# Patient Record
Sex: Male | Born: 1945 | Race: White | Hispanic: No | Marital: Married | Smoking: Former smoker
Health system: Southern US, Community
[De-identification: ages and names within clinical notes are randomized; demographics above are authoritative.]

## PROBLEM LIST (undated history)

## (undated) DIAGNOSIS — Z8669 Personal history of other diseases of the nervous system and sense organs: Secondary | ICD-10-CM

## (undated) DIAGNOSIS — G2581 Restless legs syndrome: Secondary | ICD-10-CM

## (undated) DIAGNOSIS — M1712 Unilateral primary osteoarthritis, left knee: Secondary | ICD-10-CM

## (undated) DIAGNOSIS — K219 Gastro-esophageal reflux disease without esophagitis: Secondary | ICD-10-CM

## (undated) DIAGNOSIS — C801 Malignant (primary) neoplasm, unspecified: Secondary | ICD-10-CM

## (undated) DIAGNOSIS — R202 Paresthesia of skin: Secondary | ICD-10-CM

## (undated) DIAGNOSIS — Z862 Personal history of diseases of the blood and blood-forming organs and certain disorders involving the immune mechanism: Secondary | ICD-10-CM

## (undated) DIAGNOSIS — I499 Cardiac arrhythmia, unspecified: Secondary | ICD-10-CM

## (undated) DIAGNOSIS — G4730 Sleep apnea, unspecified: Secondary | ICD-10-CM

## (undated) DIAGNOSIS — I10 Essential (primary) hypertension: Secondary | ICD-10-CM

## (undated) DIAGNOSIS — M1710 Unilateral primary osteoarthritis, unspecified knee: Secondary | ICD-10-CM

## (undated) DIAGNOSIS — G459 Transient cerebral ischemic attack, unspecified: Secondary | ICD-10-CM

## (undated) DIAGNOSIS — Z22322 Carrier or suspected carrier of Methicillin resistant Staphylococcus aureus: Secondary | ICD-10-CM

## (undated) DIAGNOSIS — G629 Polyneuropathy, unspecified: Secondary | ICD-10-CM

## (undated) DIAGNOSIS — I959 Hypotension, unspecified: Secondary | ICD-10-CM

## (undated) DIAGNOSIS — A4901 Methicillin susceptible Staphylococcus aureus infection, unspecified site: Secondary | ICD-10-CM

## (undated) DIAGNOSIS — E119 Type 2 diabetes mellitus without complications: Secondary | ICD-10-CM

## (undated) DIAGNOSIS — J302 Other seasonal allergic rhinitis: Secondary | ICD-10-CM

## (undated) DIAGNOSIS — Z87442 Personal history of urinary calculi: Secondary | ICD-10-CM

## (undated) DIAGNOSIS — R413 Other amnesia: Secondary | ICD-10-CM

## (undated) DIAGNOSIS — I739 Peripheral vascular disease, unspecified: Secondary | ICD-10-CM

## (undated) DIAGNOSIS — G4700 Insomnia, unspecified: Secondary | ICD-10-CM

## (undated) HISTORY — DX: Restless legs syndrome: G25.81

## (undated) HISTORY — PX: CATARACT EXTRACTION, BILATERAL: SHX1313

## (undated) HISTORY — PX: UPPER GI ENDOSCOPY: SHX6162

## (undated) HISTORY — PX: TONSILLECTOMY: SUR1361

## (undated) HISTORY — DX: Paresthesia of skin: R20.2

## (undated) HISTORY — DX: Polyneuropathy, unspecified: G62.9

## (undated) HISTORY — DX: Other amnesia: R41.3

## (undated) HISTORY — PX: COLONOSCOPY: SHX174

## (undated) HISTORY — DX: Sleep apnea, unspecified: G47.30

## (undated) HISTORY — DX: Essential (primary) hypertension: I10

## (undated) HISTORY — DX: Gastro-esophageal reflux disease without esophagitis: K21.9

## (undated) HISTORY — DX: Insomnia, unspecified: G47.00

---
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
## 2004-09-21 ENCOUNTER — Ambulatory Visit: Payer: Self-pay | Admitting: Internal Medicine

## 2004-10-11 ENCOUNTER — Ambulatory Visit (HOSPITAL_COMMUNITY): Admission: RE | Admit: 2004-10-11 | Discharge: 2004-10-11 | Payer: Self-pay | Admitting: Internal Medicine

## 2005-07-10 ENCOUNTER — Ambulatory Visit: Payer: Self-pay | Admitting: Internal Medicine

## 2005-07-10 ENCOUNTER — Ambulatory Visit (HOSPITAL_COMMUNITY): Admission: RE | Admit: 2005-07-10 | Discharge: 2005-07-10 | Payer: Self-pay | Admitting: Internal Medicine

## 2005-08-30 ENCOUNTER — Ambulatory Visit: Payer: Self-pay | Admitting: Cardiology

## 2007-05-25 ENCOUNTER — Ambulatory Visit: Payer: Self-pay | Admitting: Cardiology

## 2007-07-18 ENCOUNTER — Ambulatory Visit: Payer: Self-pay | Admitting: Cardiology

## 2010-04-23 ENCOUNTER — Encounter (INDEPENDENT_AMBULATORY_CARE_PROVIDER_SITE_OTHER): Payer: Self-pay | Admitting: Internal Medicine

## 2010-08-15 NOTE — Assessment & Plan Note (Signed)
[facility]                          [facility] OFFICE NOTE   Jason [name], Jason [name]                       MRN:          [id number]  DATE:07/18/2007                            DOB:          1945-06-16    CARDIOLOGIST:  Learta Codding, MD,FACC.   PRIMARY CARE PHYSICIAN:  Doreen Beam, M.D.   HISTORY OF PRESENT ILLNESS:  Jason [name] is a 65 year old male patient  with a family history of coronary artery disease, who was recently seen  at [facility] with complaints of chest pain and near syncope.  He  ruled out for myocardial function by enzymes.  He had a stress  Cardiolite study that was negative for ischemia, and his EF was 59%.  The patient was also noted to have episodes of snoring and apnea during  sleep while in the hospital.  It was suggest that he be screened for  sleep apnea.  He also had abdominal and pelvic CT in the hospital.  This  was notable for a calcific density in the LAD coronary artery branch.   The patient returns to the office today for followup.  He denies any  recurrent chest pain, shortness breath, syncope, or near syncope.   CURRENT MEDICATIONS:  1. Requip p.r.n.  2. Prilosec daily.  3. Claritin 10 mg daily.  4. Zoloft 25 mg daily.  5. Aspirin 81 mg daily.   ALLERGIES:  NO KNOWN DRUG ALLERGIES.   PHYSICAL EXAMINATION:  GENERAL:  He is a well-nourished, well-developed  male in no distress.  VITAL SIGNS:  Blood pressure 129/90, pulse 85, weight 215.8 pounds.  Oxygen saturation 97% on room air.  HEENT:  Normal.  NECK:  Without JVD.  CARDIAC:  S1, S2.  Regular rate and rhythm, without murmur.  LUNGS:  Clear to auscultation bilaterally.  ABDOMEN:  Soft, nontender.  EXTREMITIES:  Without edema.  NEUROLOGIC:  He is alert and oriented x3.  Cranial nerves II-XII grossly  intact.  VASCULAR:  No carotid artery bruits noted bilaterally.   IMPRESSION:  1. Noncardiac chest pain.      a.     Recent nonischemic  Cardiolite study.      b.     Coronary artery calcification by abdominal CT in February       2009.  2. Family history of coronary artery disease.  3. Fatigue and snoring.      a.     Rule out sleep apnea.  4. Gastroesophageal reflux disease.  5. Dyslipidemia.      a.     LDL 106 in February 2009.   DISCUSSION:  Jason [name] returns to the office today for  posthospitalization followup.  Overall, he is doing well.  With his  coronary calcifications on CT scan and family history of CAD, I think we  should treat his risk factors aggressively.  Therefore, I think his LDL  goal should be less than or equal to 70.  He is in agreement to starting  on a statin.  We should also set him up for an ApneaLink to rule out the  possibility  of significant sleep apnea.   PLAN:  1. Simvastatin 20 mg at bedtime.  2. Repeat lipid and LFTs in 12 weeks.  3. Home ApneaLink to rule out significant sleep apnea.  He will need      formal sleep study if this is significantly positive.  4. Return in 6 months for routine followup.  I would have a low      threshold to proceed with cardiac catheterization in this patient,      since he does have a significant family history of CAD.  If he has      recurrent symptoms in the future, we should probably pursue this.      He is in agreement with this.      Tereso Newcomer, PA-C  Electronically Signed      Learta Codding, MD,FACC  Electronically Signed   SW/MedQ  DD: 07/18/2007  DT: 07/18/2007  Job #: [id number]   cc:   Doreen Beam, MD

## 2010-08-18 NOTE — Op Note (Signed)
NAMEARIES, TOWNLEY                ACCOUNT NO.:  [id number]   MEDICAL RECORD NO.:  [id number]          PATIENT TYPE:  AMB   LOCATION:  DAY                           FACILITY:  [facility]   PHYSICIAN:  Lionel December, M.D.    DATE OF BIRTH:  February 14, 1946   DATE OF PROCEDURE:  07/10/2005  DATE OF DISCHARGE:                                 OPERATIVE REPORT   PROCEDURE:  Esophagogastroduodenoscopy followed by colonoscopy.   Jason [name] is 65 year old Caucasian male with recurrent epigastric pain with  nausea and sporadic episodes of vomiting, who was evaluated by me last year  in June.  He had abdominal pelvic CT which suggested thickening to posterior  gastric wall.  Prior ultrasound and hepatobiliary scan had been normal.  EGD  was recommended since he had not responded to PPI and antispasmodic therapy,  but this has been postponed until now.  He is also undergoing screening  colonoscopy.  He states he does well as long as he stays on a bland diet.  Procedures and risks were reviewed the patient, and informed consent was  obtained.   MEDICATIONS FOR CONSCIOUS SEDATION:  Benzocaine spray for pharyngeal topical  anesthesia, Demerol 50 mg IV, Versed 8 mg IV.   FINDINGS:  Procedure performed in endoscopy suite.  The patient's vital  signs and O2 sat were monitored during procedure and remained stable.   PROCEDURE:  1.  Esophagogastroduodenoscopy.  The patient was placed left lateral      position.  Rectal examination performed.  No abnormality noted external      or digital exam and remained stable.   PROCEDURE:  1.  Esophagogastroduodenoscopy.  The patient was placed left lateral      position.  Olympus videoscope was passed via oropharynx without any      difficulty into esophagus.   ESOPHAGUS.  Mucosa of the esophagus normal.  He had focal erythema at GE  junction on the gastric side.  GE junction was at 40 cm from the incisors.   STOMACH.  It was empty and distended very well with  insufflation.  Folds of  proximal stomach were normal.  Examination of mucosa revealed prepyloric  erythema and edema without erosions or ulcers.  Angularis, fundus and cardia  examined by retroflexing scope and were normal.  Pyloric channel was  spastic, but no structural abnormality of stenosis noted.   Duodenum bulbar mucosa was normal.  Scope was passed in second part of the  duodenum where mucosa and folds were normal.  Endoscope was withdrawn and  the patient prepared for procedure #2.   COLONOSCOPY:  Rectal examination performed.  No abnormality noted on  external or digital exam.  Olympus videoscope was placed in rectum and  advanced under vision into sigmoid colon beyond.  Preparation was  satisfactory.  He had few scattered tiny diverticula at sigmoid colon.  Scope was passed into cecum which was identified by appendiceal orifice and  ileocecal valve.  Pictures taken for the record.  As the scope was  withdrawn, colonic mucosa was examined for the second time and there were no  polyps or other mucosal abnormalities.  Rectal mucosa was normal.  Scope was  retroflexed to examine anorectal junction and moderate-sized hemorrhoids  were noted below the dentate line.  Endoscope was straightened and  withdrawn.  The patient tolerated the procedure well.   FINAL DIAGNOSIS:  1.  Mild changes of reflux esophagitis limited to gastroesophageal junction.  2.  Prepyloric/antral nonerosive gastritis limited to gastroesophageal      junction.  3.  Antral/prepyloric nonerosive/antral nonerosive gastritis.  4.  Few tiny diverticula at sigmoid colon and external hemorrhoids,      otherwise normal colonoscopy.   RECOMMENDATIONS:  H. pylori serology will be checked.  He will continue  antireflux measures as before and he can take Prilosec OTC on p.r.n. basis.      Lionel December, M.D.  Electronically Signed     NR/MEDQ  D:  07/10/2005  T:  07/10/2005  Job:  [id number]   cc:   Dr. Weyman Pedro

## 2012-03-24 ENCOUNTER — Encounter: Payer: Self-pay | Admitting: Cardiology

## 2012-04-04 ENCOUNTER — Encounter: Payer: Self-pay | Admitting: Cardiology

## 2012-04-04 ENCOUNTER — Encounter: Payer: Self-pay | Admitting: *Deleted

## 2012-04-04 ENCOUNTER — Ambulatory Visit (INDEPENDENT_AMBULATORY_CARE_PROVIDER_SITE_OTHER): Payer: Medicare Other | Admitting: Cardiology

## 2012-04-04 VITALS — BP 135/91 | HR 79 | Ht 70.0 in | Wt 237.8 lb

## 2012-04-04 DIAGNOSIS — Z136 Encounter for screening for cardiovascular disorders: Secondary | ICD-10-CM

## 2012-04-04 DIAGNOSIS — R072 Precordial pain: Secondary | ICD-10-CM | POA: Insufficient documentation

## 2012-04-04 NOTE — Patient Instructions (Addendum)
Your physician has requested that you have an exercise tolerance test. For further information please visit [email or web address] Please also follow instruction sheet, as given. Your physician recommends that you continue on your current medications as directed. Please refer to the Current Medication list given to you today.  We will call you with your results.

## 2012-04-04 NOTE — Progress Notes (Signed)
HPI The patient presents for evaluation of lightheadedness with a presyncopal episode. We have seen him in the past. 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 There is a mention of a normal stress test in the past though I don't have this. He mentions a nuclear stress test as well as treadmill. I see an echocardiogram done recently which demonstrates no significant abnormalities. He has been noted to have some ventricular ectopy with stress testing in the past. However, he has not required any treatment for this. He has not had any palpitations. The episode of presyncope occurred after he ate something and became nauseated and diaphoretic. He had some chest discomfort with this. [place] seek medical attention. He let it pass over many minutes. He has had some occasional chest discomfort that has been vague and at rest. He's able to do things like some yard work and cut firewood without bringing on any symptoms. He doesn't have any new shortness of breath, PND or orthopnea. He does describe so upper abdominal or lower epigastric discomfort with bloating and indigestion occasionally.  Allergies  Allergen Reactions  . Augmentin (Amoxicillin-Pot Clavulanate)     Nausea   . Flexeril (Cyclobenzaprine)     Fatigue and sleepiness  . Penicillins     Current Outpatient Prescriptions  Medication Sig Dispense Refill  . albuterol-ipratropium (COMBIVENT) 18-103 MCG/ACT inhaler Inhale 2 puffs into the lungs every 6 (six) hours as needed.      . carbidopa-levodopa (SINEMET IR) 25-100 MG per tablet Take 1 tablet by mouth 2 (two) times daily as needed.      . metoprolol succinate (TOPROL-XL) 25 MG 24 hr tablet Take 25 mg by mouth daily.      . Multiple Vitamin (MULTIVITAMIN) tablet Take 1 tablet by mouth daily.      Marland Kitchen omeprazole (PRILOSEC) 20 MG capsule Take 20 mg by mouth 2 (two) times daily.      Marland Kitchen rOPINIRole (REQUIP) 2 MG tablet Take 2 mg  by mouth 2 (two) times daily.       Marland Kitchen zolpidem (AMBIEN) 10 MG tablet Take 10 mg by mouth at bedtime as needed.         Past Medical History  Diagnosis Date  . Allergic rhinitis, cause unspecified   . Sleep apnea   . Migraine, unspecified, without mention of intractable migraine without mention of status migrainosus   . Restless legs   . Insomnia     Past Surgical History  Procedure Date  . Tonsillectomy and adenoidectomy     Family History  Problem Relation Age of Onset  . Emphysema Father     Died age 63  . Sudden death Mother     Died age 74  . CAD Brother     Died age 73  . Hypertension Brother     History   Social History  . Marital Status: Married    Spouse Name: N/A    Number of Children: 2  . Years of Education: N/A   Occupational History  . Not on file.   Social History Main Topics  . Smoking status: Former Smoker    Types: Pipe  . Smokeless tobacco: Not on file  . Alcohol Use: Not on file  . Drug Use: Not on file  . Sexually Active: Not on file   Other Topics Concern  . Not on file   Social  History Narrative   Retiring from [employer].      ROS:  As stated in the HPI and negative for all other systems.  PHYSICAL EXAM BP 135/91  Pulse 79  Ht 5\' 10"  (1.778 m)  Wt 237 lb 12.8 oz (107.865 kg)  BMI 34.12 kg/m2 GENERAL:  Well appearing HEENT:  Pupils equal round and reactive, fundi not visualized, oral mucosa unremarkable NECK:  No jugular venous distention, waveform within normal limits, carotid upstroke brisk and symmetric, no bruits, no thyromegaly LYMPHATICS:  No cervical, inguinal adenopathy LUNGS:  Clear to auscultation bilaterally BACK:  No CVA tenderness CHEST:  Unremarkable HEART:  PMI not displaced or sustained,S1 and S2 within normal limits, no S3, no S4, no clicks, no rubs, no murmurs ABD:  Flat, positive bowel sounds normal in frequency in pitch, no bruits, no rebound, no guarding, no midline pulsatile mass, no hepatomegaly, no  splenomegaly EXT:  2 plus pulses throughout, no edema, no cyanosis no clubbing SKIN:  No rashes no nodules NEURO:  Cranial nerves II through XII grossly intact, motor grossly intact throughout PSYCH:  Cognitively intact, oriented to person place and time   EKG:  Sinus rhythm, rate 79, axis within normal limits, intervals within normal limits, early transition in lead V2, nonspecific T-wave changes.  ASSESSMENT AND PLAN  Chest discomfort - The patient's chest discomfort is somewhat atypical. However, given a mild family history of sudden cardiac death in his mom and an early age screening with a stress test is indicated.  I will bring the patient back for a POET (Plain Old Exercise Test). This will allow me to screen for obstructive coronary disease, risk stratify and very importantly provide a prescription for exercise.  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 This can be further evaluated at the time of the stress test.  Overweight - He and I discussed the need for and plan for weight loss with diet and exercise.  Presyncope - I suspect this was a vagal episode related to GI problems.

## 2012-04-07 ENCOUNTER — Ambulatory Visit: Payer: Self-pay | Admitting: Cardiology

## 2012-04-09 DIAGNOSIS — R072 Precordial pain: Secondary | ICD-10-CM

## 2012-04-15 ENCOUNTER — Telehealth: Payer: Self-pay | Admitting: *Deleted

## 2012-04-15 NOTE — Telephone Encounter (Signed)
Patient informed and copy sent to PCP. 

## 2012-04-15 NOTE — Telephone Encounter (Signed)
Message copied by Eustace Moore on Tue Apr 15, 2012  8:53 AM ------      Message from: Rollene Rotunda      Created: Sun Apr 13, 2012  3:22 PM       Negative ETT.  No further work up.  Call Mr. Mcferran with the results and send results to VYAS,DHRUV B., MD

## 2013-01-15 ENCOUNTER — Encounter: Payer: Self-pay | Admitting: Podiatry

## 2013-01-15 ENCOUNTER — Ambulatory Visit (INDEPENDENT_AMBULATORY_CARE_PROVIDER_SITE_OTHER): Payer: Medicare PPO

## 2013-01-15 ENCOUNTER — Ambulatory Visit (INDEPENDENT_AMBULATORY_CARE_PROVIDER_SITE_OTHER): Payer: Medicare PPO | Admitting: Podiatry

## 2013-01-15 VITALS — BP 125/85 | HR 79 | Resp 16 | Ht 70.0 in | Wt 235.0 lb

## 2013-01-15 DIAGNOSIS — R52 Pain, unspecified: Secondary | ICD-10-CM

## 2013-01-15 DIAGNOSIS — M722 Plantar fascial fibromatosis: Secondary | ICD-10-CM

## 2013-01-15 MED ORDER — MELOXICAM 15 MG PO TABS: 15.0000 mg | ORAL_TABLET | Freq: Every day | ORAL | Status: DC

## 2013-01-15 MED ORDER — METHYLPREDNISOLONE (PAK) 4 MG PO TABS: ORAL_TABLET | ORAL | Status: DC

## 2013-01-15 NOTE — Progress Notes (Signed)
Jason [name] presents today as a 67 year old white male with a chief complaint of painful feet bilaterally. He states that this is been going on for more than a year he still the pain from that his heels to his forefoot. He states it feels like he is walking on a swollen forefoot. Relates it's hard to stand for long periods of time. He has tried nonsteroidal anti-inflammatory drugs and rest to no avail. He states that he primarily wears deck shoes. Mornings are particularly bad and seems to worsen throughout the day. Seems the right foot may be worse than the left.  Objective: Vital signs are stable he is alert and oriented x3. I have reviewed his past medical history medications allergies and review of systems. Currently he has strong palpable pulses to the bilateral lower extremity. Neurologic sensorium is intact per Semmes-Weinstein monofilament. Deep tendon reflexes are intact and brisk bilateral. Muscle strength is intact and strong bilateral. Orthopedic evaluation demonstrates all joints distal to the ankle have a full range of motion without crepitus. He does have tenderness on end range of motion of the second metatarsophalangeal joints. Right greater than left. He does have pain on palpation of the medial calcaneal tubercle right greater than left. No pain on medial lateral compression of the calcaneus. Radiographic evaluation does demonstrate soft tissue increase in density at the plantar fascial calcaneal insertion site. Right greater than left.  Assessment: Plantar fasciitis bilateral with lateral compensatory syndrome and forefoot capsulitis at the metatarsophalangeal joints.  Plan: We discussed etiology pathology conservative versus surgical therapies. At this time we injected his bilateral heels at the point of maximal tenderness with 20 mg of Kenalog. Plantar fascial strapping was were applied. A night splint to the right foot. We discussed appropriate shoe gear stretching exercises ice therapy. He  was given both oral and written home-going instructions for the symptoms. He was also prescribed a Medrol Dosepak which will be followed by Mobic. I will followup with him in one month.

## 2013-01-15 NOTE — Patient Instructions (Signed)
Plantar Fasciitis (Heel Spur Syndrome) with Rehab The plantar fascia is a fibrous, ligament-like, soft-tissue structure that spans the bottom of the foot. Plantar fasciitis is a condition that causes pain in the foot due to inflammation of the tissue. SYMPTOMS   Pain and tenderness on the underneath side of the foot.  Pain that worsens with standing or walking. CAUSES  Plantar fasciitis is caused by irritation and injury to the plantar fascia on the underneath side of the foot. Common mechanisms of injury include:  Direct trauma to bottom of the foot.  Damage to a small nerve that runs under the foot where the main fascia attaches to the heel bone.  Stress placed on the plantar fascia due to bone spurs. RISK INCREASES WITH:   Activities that place stress on the plantar fascia (running, jumping, pivoting, or cutting).  Poor strength and flexibility.  Improperly fitted shoes.  Tight calf muscles.  Flat feet.  Failure to warm-up properly before activity.  Obesity. PREVENTION  Warm up and stretch properly before activity.  Allow for adequate recovery between workouts.  Maintain physical fitness:  Strength, flexibility, and endurance.  Cardiovascular fitness.  Maintain a health body weight.  Avoid stress on the plantar fascia.  Wear properly fitted shoes, including arch supports for individuals who have flat feet. PROGNOSIS  If treated properly, then the symptoms of plantar fasciitis usually resolve without surgery. However, occasionally surgery is necessary. RELATED COMPLICATIONS   Recurrent symptoms that may result in a chronic condition.  Problems of the lower back that are caused by compensating for the injury, such as limping.  Pain or weakness of the foot during push-off following surgery.  Chronic inflammation, scarring, and partial or complete fascia tear, occurring more often from repeated injections. TREATMENT  Treatment initially involves the use of  ice and medication to help reduce pain and inflammation. The use of strengthening and stretching exercises may help reduce pain with activity, especially stretches of the Achilles tendon. These exercises may be performed at home or with a therapist. Your caregiver may recommend that you use heel cups of arch supports to help reduce stress on the plantar fascia. Occasionally, corticosteroid injections are given to reduce inflammation. If symptoms persist for greater than 6 months despite non-surgical (conservative), then surgery may be recommended.  MEDICATION   If pain medication is necessary, then nonsteroidal anti-inflammatory medications, such as aspirin and ibuprofen, or other minor pain relievers, such as acetaminophen, are often recommended.  Do not take pain medication within 7 days before surgery.  Prescription pain relievers may be given if deemed necessary by your caregiver. Use only as directed and only as much as you need.  Corticosteroid injections may be given by your caregiver. These injections should be reserved for the most serious cases, because they may only be given a certain number of times. HEAT AND COLD  Cold treatment (icing) relieves pain and reduces inflammation. Cold treatment should be applied for 10 to 15 minutes every 2 to 3 hours for inflammation and pain and immediately after any activity that aggravates your symptoms. Use ice packs or massage the area with a piece of ice (ice massage).  Heat treatment may be used prior to performing the stretching and strengthening activities prescribed by your caregiver, physical therapist, or athletic trainer. Use a heat pack or soak the injury in warm water. SEEK IMMEDIATE MEDICAL CARE IF:  Treatment seems to offer no benefit, or the condition worsens.  Any medications produce adverse side effects. EXERCISES RANGE   OF MOTION (ROM) AND STRETCHING EXERCISES - Plantar Fasciitis (Heel Spur Syndrome) These exercises may help you  when beginning to rehabilitate your injury. Your symptoms may resolve with or without further involvement from your physician, physical therapist or athletic trainer. While completing these exercises, remember:   Restoring tissue flexibility helps normal motion to return to the joints. This allows healthier, less painful movement and activity.  An effective stretch should be held for at least 30 seconds.  A stretch should never be painful. You should only feel a gentle lengthening or release in the stretched tissue. RANGE OF MOTION - Toe Extension, Flexion  Sit with your right / left leg crossed over your opposite knee.  Grasp your toes and gently pull them back toward the top of your foot. You should feel a stretch on the bottom of your toes and/or foot.  Hold this stretch for __________ seconds.  Now, gently pull your toes toward the bottom of your foot. You should feel a stretch on the top of your toes and or foot.  Hold this stretch for __________ seconds. Repeat __________ times. Complete this stretch __________ times per day.  RANGE OF MOTION - Ankle Dorsiflexion, Active Assisted  Remove shoes and sit on a chair that is preferably not on a carpeted surface.  Place right / left foot under knee. Extend your opposite leg for support.  Keeping your heel down, slide your right / left foot back toward the chair until you feel a stretch at your ankle or calf. If you do not feel a stretch, slide your bottom forward to the edge of the chair, while still keeping your heel down.  Hold this stretch for __________ seconds. Repeat __________ times. Complete this stretch __________ times per day.  STRETCH  Gastroc, Standing  Place hands on wall.  Extend right / left leg, keeping the front knee somewhat bent.  Slightly point your toes inward on your back foot.  Keeping your right / left heel on the floor and your knee straight, shift your weight toward the wall, not allowing your back to  arch.  You should feel a gentle stretch in the right / left calf. Hold this position for __________ seconds. Repeat __________ times. Complete this stretch __________ times per day. STRETCH  Soleus, Standing  Place hands on wall.  Extend right / left leg, keeping the other knee somewhat bent.  Slightly point your toes inward on your back foot.  Keep your right / left heel on the floor, bend your back knee, and slightly shift your weight over the back leg so that you feel a gentle stretch deep in your back calf.  Hold this position for __________ seconds. Repeat __________ times. Complete this stretch __________ times per day. STRETCH  Gastrocsoleus, Standing  Note: This exercise can place a lot of stress on your foot and ankle. Please complete this exercise only if specifically instructed by your caregiver.   Place the ball of your right / left foot on a step, keeping your other foot firmly on the same step.  Hold on to the wall or a rail for balance.  Slowly lift your other foot, allowing your body weight to press your heel down over the edge of the step.  You should feel a stretch in your right / left calf.  Hold this position for __________ seconds.  Repeat this exercise with a slight bend in your right / left knee. Repeat __________ times. Complete this stretch __________ times per day.    STRENGTHENING EXERCISES - Plantar Fasciitis (Heel Spur Syndrome)  These exercises may help you when beginning to rehabilitate your injury. They may resolve your symptoms with or without further involvement from your physician, physical therapist or athletic trainer. While completing these exercises, remember:   Muscles can gain both the endurance and the strength needed for everyday activities through controlled exercises.  Complete these exercises as instructed by your physician, physical therapist or athletic trainer. Progress the resistance and repetitions only as guided. STRENGTH - Towel  Curls  Sit in a chair positioned on a non-carpeted surface.  Place your foot on a towel, keeping your heel on the floor.  Pull the towel toward your heel by only curling your toes. Keep your heel on the floor.  If instructed by your physician, physical therapist or athletic trainer, add ____________________ at the end of the towel. Repeat __________ times. Complete this exercise __________ times per day. STRENGTH - Ankle Inversion  Secure one end of a rubber exercise band/tubing to a fixed object (table, pole). Loop the other end around your foot just before your toes.  Place your fists between your knees. This will focus your strengthening at your ankle.  Slowly, pull your big toe up and in, making sure the band/tubing is positioned to resist the entire motion.  Hold this position for __________ seconds.  Have your muscles resist the band/tubing as it slowly pulls your foot back to the starting position. Repeat __________ times. Complete this exercises __________ times per day.  Document Released: 03/19/2005 Document Revised: 06/11/2011 Document Reviewed: 07/01/2008 [employer] Patient Information 2014 [employer]. Plantar Fasciitis Plantar fasciitis is a common condition that causes foot pain. It is soreness (inflammation) of the band of tough fibrous tissue on the bottom of the foot that runs from the heel bone (calcaneus) to the ball of the foot. The cause of this soreness may be from excessive standing, poor fitting shoes, running on hard surfaces, being overweight, having an abnormal walk, or overuse (this is common in runners) of the painful foot or feet. It is also common in aerobic exercise dancers and ballet dancers. SYMPTOMS  Most people with plantar fasciitis complain of:  Severe pain in the morning on the bottom of their foot especially when taking the first steps out of bed. This pain recedes after a few minutes of walking.  Severe pain is experienced also during walking  following a long period of inactivity.  Pain is worse when walking barefoot or up stairs DIAGNOSIS   Your caregiver will diagnose this condition by examining and feeling your foot.  Special tests such as X-rays of your foot, are usually not needed. PREVENTION   Consult a sports medicine professional before beginning a new exercise program.  Walking programs offer a good workout. With walking there is a lower chance of overuse injuries common to runners. There is less impact and less jarring of the joints.  Begin all new exercise programs slowly. If problems or pain develop, decrease the amount of time or distance until you are at a comfortable level.  Wear good shoes and replace them regularly.  Stretch your foot and the heel cords at the back of the ankle (Achilles tendon) both before and after exercise.  Run or exercise on even surfaces that are not hard. For example, asphalt is better than pavement.  Do not run barefoot on hard surfaces.  If using a treadmill, vary the incline.  Do not continue to workout if you have foot or joint   problems. Seek professional help if they do not improve. HOME CARE INSTRUCTIONS   Avoid activities that cause you pain until you recover.  Use ice or cold packs on the problem or painful areas after working out.  Only take over-the-counter or prescription medicines for pain, discomfort, or fever as directed by your caregiver.  Soft shoe inserts or athletic shoes with air or gel sole cushions may be helpful.  If problems continue or become more severe, consult a sports medicine caregiver or your own health care provider. Cortisone is a potent anti-inflammatory medication that may be injected into the painful area. You can discuss this treatment with your caregiver. MAKE SURE YOU:   Understand these instructions.  Will watch your condition.  Will get help right away if you are not doing well or get worse. Document Released: 12/12/2000 Document  Revised: 06/11/2011 Document Reviewed: 02/11/2008 [employer] Patient Information 2014 [employer].  

## 2013-01-15 NOTE — Progress Notes (Signed)
  Subjective:    Patient ID: Jason [name], male    DOB: 12-Nov-1945, 67 y.o.   MRN: [id number] "My feet hurt all over and they seem to swell."   Foot Pain This is a new problem. The current episode started more than 1 year ago. The problem occurs daily. The problem has been gradually worsening. Associated symptoms include arthralgias. The symptoms are aggravated by walking and standing. He has tried NSAIDs and rest for the symptoms. The treatment provided no relief.      Review of Systems  Constitutional: Positive for appetite change.  HENT: Negative.   Eyes: Negative.   Respiratory: Positive for apnea.   Cardiovascular: Negative.   Gastrointestinal: Positive for constipation.  Endocrine: Positive for cold intolerance.  Genitourinary: Negative.   Musculoskeletal: Positive for arthralgias and back pain.  Skin: Negative.   Allergic/Immunologic: Positive for environmental allergies.  Neurological: Negative.   Hematological: Negative.   Psychiatric/Behavioral: Positive for sleep disturbance.       Objective:   Physical Exam        Assessment & Plan:

## 2013-01-27 ENCOUNTER — Telehealth: Payer: Self-pay | Admitting: *Deleted

## 2013-01-27 NOTE — Telephone Encounter (Signed)
Pt asked how long to wear the strap brace, it's been on since 01/15/2013.  Pt described the tape strapping.  I informed pt that he could take the strappings off.   Pt states they did not help still having a lot of pain.  I transferred to schedulers for an appt.

## 2013-02-12 ENCOUNTER — Ambulatory Visit: Payer: Medicare PPO | Admitting: Podiatry

## 2013-02-17 ENCOUNTER — Encounter: Payer: Self-pay | Admitting: Podiatry

## 2013-02-17 ENCOUNTER — Ambulatory Visit (INDEPENDENT_AMBULATORY_CARE_PROVIDER_SITE_OTHER): Payer: Medicare PPO | Admitting: Podiatry

## 2013-02-17 VITALS — BP 120/82 | HR 62 | Resp 16 | Ht 70.0 in | Wt 240.0 lb

## 2013-02-17 DIAGNOSIS — M722 Plantar fascial fibromatosis: Secondary | ICD-10-CM

## 2013-02-17 MED ORDER — DICLOFENAC SODIUM 75 MG PO TBEC: 75.0000 mg | DELAYED_RELEASE_TABLET | Freq: Two times a day (BID) | ORAL | Status: DC

## 2013-02-17 NOTE — Progress Notes (Signed)
Jason [name] presents today for followup of his plantar fasciitis he states he has good days and bad days.  Objective: Pulses are strongly palpable bilateral mild tenderness on palpation of the bilateral heels. They're not warm to the touch.  Assessment: Mild plantar fasciitis apparently resolving.  Plan: Discontinue the use of Mobic we will switch him over to diclofenac 75 mg 1 by mouth twice daily. I will followup with him in one month.

## 2013-03-05 ENCOUNTER — Ambulatory Visit: Payer: Medicare PPO | Admitting: Podiatry

## 2013-03-31 ENCOUNTER — Ambulatory Visit: Payer: Medicare PPO | Admitting: Podiatry

## 2013-05-20 ENCOUNTER — Other Ambulatory Visit: Payer: Self-pay | Admitting: Orthopedic Surgery

## 2013-05-25 ENCOUNTER — Encounter (HOSPITAL_COMMUNITY): Payer: Self-pay | Admitting: Pharmacy Technician

## 2013-05-28 ENCOUNTER — Inpatient Hospital Stay (HOSPITAL_COMMUNITY)
Admission: RE | Admit: 2013-05-28 | Discharge: 2013-05-28 | Disposition: A | Discharge status: Home / Self Care | Payer: BC Managed Care – PPO | Source: Ambulatory Visit

## 2013-05-28 ENCOUNTER — Encounter (HOSPITAL_COMMUNITY): Payer: Self-pay

## 2013-05-28 NOTE — Progress Notes (Addendum)
PCP is Dr Louis Matte Cardiologist is Dr Percival Spanish at [facility]  Pt unable to make it here due to the snow, telephone interview completed with instructions given to be here March 3rd at 0800.

## 2013-06-01 MED ORDER — CLINDAMYCIN PHOSPHATE 900 MG/50ML IV SOLN
900.0000 mg | INTRAVENOUS | Status: AC
  Administered 2013-06-02: 900 mg via INTRAVENOUS
  Filled 2013-06-01: qty 50

## 2013-06-02 ENCOUNTER — Encounter (HOSPITAL_COMMUNITY)
Admission: RE | Disposition: A | Discharge status: Home Health | Payer: Self-pay | Source: Ambulatory Visit | Attending: Orthopedic Surgery

## 2013-06-02 ENCOUNTER — Encounter (HOSPITAL_COMMUNITY): Payer: Medicare PPO | Admitting: Certified Registered Nurse Anesthetist

## 2013-06-02 ENCOUNTER — Inpatient Hospital Stay (HOSPITAL_COMMUNITY): Payer: Medicare PPO

## 2013-06-02 ENCOUNTER — Encounter (HOSPITAL_COMMUNITY): Payer: Self-pay | Admitting: Anesthesiology

## 2013-06-02 ENCOUNTER — Inpatient Hospital Stay (HOSPITAL_COMMUNITY)
Admission: RE | Admit: 2013-06-02 | Discharge: 2013-06-03 | DRG: 470 | Disposition: A | Discharge status: Home Health | Payer: Medicare PPO | Source: Ambulatory Visit | Attending: Orthopedic Surgery | Admitting: Orthopedic Surgery

## 2013-06-02 ENCOUNTER — Inpatient Hospital Stay (HOSPITAL_COMMUNITY): Payer: Medicare PPO | Admitting: Certified Registered Nurse Anesthetist

## 2013-06-02 DIAGNOSIS — I10 Essential (primary) hypertension: Secondary | ICD-10-CM | POA: Diagnosis present

## 2013-06-02 DIAGNOSIS — Z7982 Long term (current) use of aspirin: Secondary | ICD-10-CM

## 2013-06-02 DIAGNOSIS — G8918 Other acute postprocedural pain: Secondary | ICD-10-CM | POA: Diagnosis not present

## 2013-06-02 DIAGNOSIS — G4700 Insomnia, unspecified: Secondary | ICD-10-CM | POA: Diagnosis present

## 2013-06-02 DIAGNOSIS — Z836 Family history of other diseases of the respiratory system: Secondary | ICD-10-CM

## 2013-06-02 DIAGNOSIS — Z888 Allergy status to other drugs, medicaments and biological substances status: Secondary | ICD-10-CM

## 2013-06-02 DIAGNOSIS — Z79899 Other long term (current) drug therapy: Secondary | ICD-10-CM

## 2013-06-02 DIAGNOSIS — K219 Gastro-esophageal reflux disease without esophagitis: Secondary | ICD-10-CM | POA: Diagnosis present

## 2013-06-02 DIAGNOSIS — G4730 Sleep apnea, unspecified: Secondary | ICD-10-CM

## 2013-06-02 DIAGNOSIS — M1710 Unilateral primary osteoarthritis, unspecified knee: Principal | ICD-10-CM | POA: Diagnosis present

## 2013-06-02 DIAGNOSIS — G2581 Restless legs syndrome: Secondary | ICD-10-CM | POA: Diagnosis present

## 2013-06-02 DIAGNOSIS — Z881 Allergy status to other antibiotic agents status: Secondary | ICD-10-CM

## 2013-06-02 DIAGNOSIS — M1711 Unilateral primary osteoarthritis, right knee: Secondary | ICD-10-CM | POA: Diagnosis present

## 2013-06-02 DIAGNOSIS — Z8249 Family history of ischemic heart disease and other diseases of the circulatory system: Secondary | ICD-10-CM

## 2013-06-02 DIAGNOSIS — Z87891 Personal history of nicotine dependence: Secondary | ICD-10-CM

## 2013-06-02 DIAGNOSIS — Z880 Allergy status to penicillin: Secondary | ICD-10-CM

## 2013-06-02 DIAGNOSIS — G43909 Migraine, unspecified, not intractable, without status migrainosus: Secondary | ICD-10-CM | POA: Diagnosis present

## 2013-06-02 HISTORY — PX: PARTIAL KNEE ARTHROPLASTY: SHX2174

## 2013-06-02 LAB — BASIC METABOLIC PANEL
BUN: 21 mg/dL (ref 6–23)
CO2: 21 mEq/L (ref 19–32)
Calcium: 9.3 mg/dL (ref 8.4–10.5)
Chloride: 101 mEq/L (ref 96–112)
Creatinine, Ser: 0.71 mg/dL (ref 0.50–1.35)
GFR calc Af Amer: >90 mL/min (ref >90)
GLUCOSE: 120 mg/dL — AB (ref 70–99)
Potassium: 4.4 mEq/L (ref 3.7–5.3)
SODIUM: 138 meq/L (ref 137–147)

## 2013-06-02 LAB — CBC
HCT: 42.4 % (ref 39.0–52.0)
HEMATOCRIT: 41.9 % (ref 39.0–52.0)
Hemoglobin: 15.1 g/dL (ref 13.0–17.0)
Hemoglobin: 14.6 g/dL (ref 13.0–17.0)
MCH: 32.5 pg (ref 26.0–34.0)
MCH: 32.2 pg (ref 26.0–34.0)
MCHC: 35.6 g/dL (ref 30.0–36.0)
MCHC: 34.8 g/dL (ref 30.0–36.0)
MCV: 91.2 fL (ref 78.0–100.0)
MCV: 92.3 fL (ref 78.0–100.0)
PLATELETS: 227 10*3/uL (ref 150–400)
Platelets: 217 10*3/uL (ref 150–400)
RBC: 4.65 MIL/uL (ref 4.22–5.81)
RBC: 4.54 MIL/uL (ref 4.22–5.81)
RDW: 14.2 % (ref 11.5–15.5)
RDW: 14.4 % (ref 11.5–15.5)
WBC: 8.6 10*3/uL (ref 4.0–10.5)
WBC: 10 10*3/uL (ref 4.0–10.5)

## 2013-06-02 LAB — SURGICAL PCR SCREEN
MRSA, PCR: NEGATIVE
Staphylococcus aureus: NEGATIVE

## 2013-06-02 LAB — CREATININE, SERUM: CREATININE: 0.81 mg/dL (ref 0.50–1.35)

## 2013-06-02 SURGERY — ARTHROPLASTY, KNEE, UNICOMPARTMENTAL
Anesthesia: Regional | Site: Knee | Laterality: Right

## 2013-06-02 MED ORDER — HYDROMORPHONE HCL PF 1 MG/ML IJ SOLN: 1.0000 mg | INTRAMUSCULAR | Status: DC | PRN

## 2013-06-02 MED ORDER — SODIUM CHLORIDE 0.9 % IR SOLN
Status: DC | PRN
  Administered 2013-06-02: 1000 mL

## 2013-06-02 MED ORDER — KETOROLAC TROMETHAMINE 15 MG/ML IJ SOLN
7.5000 mg | Freq: Four times a day (QID) | INTRAMUSCULAR | Status: AC
  Administered 2013-06-02 – 2013-06-03 (×4): 7.5 mg via INTRAVENOUS
  Filled 2013-06-02 (×2): qty 1

## 2013-06-02 MED ORDER — KETOROLAC TROMETHAMINE 15 MG/ML IJ SOLN
INTRAMUSCULAR | Status: AC
  Filled 2013-06-02: qty 1

## 2013-06-02 MED ORDER — SENNA-DOCUSATE SODIUM 8.6-50 MG PO TABS: 2.0000 | ORAL_TABLET | Freq: Every day | ORAL | Status: DC

## 2013-06-02 MED ORDER — SENNA 8.6 MG PO TABS
1.0000 | ORAL_TABLET | Freq: Two times a day (BID) | ORAL | Status: DC
  Administered 2013-06-02 – 2013-06-03 (×2): 8.6 mg via ORAL
  Filled 2013-06-02 (×3): qty 1

## 2013-06-02 MED ORDER — EPHEDRINE SULFATE 50 MG/ML IJ SOLN
INTRAMUSCULAR | Status: DC | PRN
  Administered 2013-06-02: 10 mg via INTRAVENOUS

## 2013-06-02 MED ORDER — DEXAMETHASONE SODIUM PHOSPHATE 4 MG/ML IJ SOLN
INTRAMUSCULAR | Status: DC | PRN
  Administered 2013-06-02: 4 mg via INTRAVENOUS

## 2013-06-02 MED ORDER — ACETAMINOPHEN 650 MG RE SUPP: 650.0000 mg | Freq: Four times a day (QID) | RECTAL | Status: DC | PRN

## 2013-06-02 MED ORDER — LIDOCAINE HCL (CARDIAC) 20 MG/ML IV SOLN
INTRAVENOUS | Status: AC
  Filled 2013-06-02: qty 5

## 2013-06-02 MED ORDER — METHOCARBAMOL 100 MG/ML IJ SOLN
500.0000 mg | Freq: Four times a day (QID) | INTRAVENOUS | Status: DC | PRN
  Filled 2013-06-02: qty 5

## 2013-06-02 MED ORDER — ZOLPIDEM TARTRATE 5 MG PO TABS: 5.0000 mg | ORAL_TABLET | Freq: Every evening | ORAL | Status: DC | PRN

## 2013-06-02 MED ORDER — OXYCODONE HCL 5 MG PO TABS
5.0000 mg | ORAL_TABLET | ORAL | Status: DC | PRN
  Administered 2013-06-02: 10 mg via ORAL
  Administered 2013-06-02: 5 mg via ORAL
  Administered 2013-06-03 (×3): 10 mg via ORAL
  Filled 2013-06-02 (×4): qty 2

## 2013-06-02 MED ORDER — KETOROLAC TROMETHAMINE 30 MG/ML IJ SOLN
INTRAMUSCULAR | Status: AC
  Administered 2013-06-02: 7.5 mg
  Filled 2013-06-02: qty 1

## 2013-06-02 MED ORDER — EPHEDRINE SULFATE 50 MG/ML IJ SOLN
INTRAMUSCULAR | Status: AC
  Filled 2013-06-02: qty 1

## 2013-06-02 MED ORDER — METHOCARBAMOL 500 MG PO TABS
500.0000 mg | ORAL_TABLET | Freq: Four times a day (QID) | ORAL | Status: DC | PRN
  Administered 2013-06-02 – 2013-06-03 (×3): 500 mg via ORAL
  Filled 2013-06-02 (×2): qty 1

## 2013-06-02 MED ORDER — STERILE WATER FOR INJECTION IJ SOLN
INTRAMUSCULAR | Status: AC
  Filled 2013-06-02: qty 10

## 2013-06-02 MED ORDER — CARBIDOPA-LEVODOPA 25-100 MG PO TABS
1.0000 | ORAL_TABLET | Freq: Two times a day (BID) | ORAL | Status: DC | PRN
  Filled 2013-06-02: qty 1

## 2013-06-02 MED ORDER — CEFAZOLIN SODIUM-DEXTROSE 2-3 GM-% IV SOLR
2.0000 g | Freq: Four times a day (QID) | INTRAVENOUS | Status: AC
  Administered 2013-06-02 (×2): 2 g via INTRAVENOUS
  Filled 2013-06-02 (×2): qty 50

## 2013-06-02 MED ORDER — DIPHENHYDRAMINE HCL 12.5 MG/5ML PO ELIX: 12.5000 mg | ORAL_SOLUTION | ORAL | Status: DC | PRN

## 2013-06-02 MED ORDER — METHOCARBAMOL 500 MG PO TABS: 500.0000 mg | ORAL_TABLET | Freq: Four times a day (QID) | ORAL | Status: DC

## 2013-06-02 MED ORDER — MIDAZOLAM HCL 2 MG/2ML IJ SOLN
INTRAMUSCULAR | Status: AC
  Administered 2013-06-02: 2 mg
  Filled 2013-06-02: qty 2

## 2013-06-02 MED ORDER — ONDANSETRON HCL 4 MG/2ML IJ SOLN
INTRAMUSCULAR | Status: AC
  Filled 2013-06-02: qty 2

## 2013-06-02 MED ORDER — ENOXAPARIN SODIUM 30 MG/0.3ML ~~LOC~~ SOLN
30.0000 mg | Freq: Two times a day (BID) | SUBCUTANEOUS | Status: DC
  Administered 2013-06-03: 30 mg via SUBCUTANEOUS
  Filled 2013-06-02 (×3): qty 0.3

## 2013-06-02 MED ORDER — ARTIFICIAL TEARS OP OINT
TOPICAL_OINTMENT | OPHTHALMIC | Status: DC | PRN
  Administered 2013-06-02: 1 via OPHTHALMIC

## 2013-06-02 MED ORDER — OXYCODONE HCL 5 MG PO TABS
5.0000 mg | ORAL_TABLET | Freq: Once | ORAL | Status: AC | PRN
  Administered 2013-06-02: 5 mg via ORAL

## 2013-06-02 MED ORDER — METOCLOPRAMIDE HCL 10 MG PO TABS
5.0000 mg | ORAL_TABLET | Freq: Three times a day (TID) | ORAL | Status: DC | PRN
Start: 2013-06-02 — End: 2013-06-03

## 2013-06-02 MED ORDER — MUPIROCIN 2 % EX OINT
TOPICAL_OINTMENT | CUTANEOUS | Status: AC
  Administered 2013-06-02: 08:00:00 via NASAL
  Filled 2013-06-02: qty 22

## 2013-06-02 MED ORDER — HYDROMORPHONE HCL PF 1 MG/ML IJ SOLN
INTRAMUSCULAR | Status: AC
  Filled 2013-06-02: qty 1

## 2013-06-02 MED ORDER — METOPROLOL TARTRATE 25 MG PO TABS
25.0000 mg | ORAL_TABLET | Freq: Two times a day (BID) | ORAL | Status: DC
  Administered 2013-06-02 – 2013-06-03 (×2): 25 mg via ORAL
  Filled 2013-06-02 (×3): qty 1

## 2013-06-02 MED ORDER — PROPOFOL 10 MG/ML IV BOLUS
INTRAVENOUS | Status: DC | PRN
  Administered 2013-06-02: 190 mg via INTRAVENOUS

## 2013-06-02 MED ORDER — DEXAMETHASONE SODIUM PHOSPHATE 4 MG/ML IJ SOLN
INTRAMUSCULAR | Status: AC
  Filled 2013-06-02: qty 1

## 2013-06-02 MED ORDER — PROMETHAZINE HCL 25 MG PO TABS: 25.0000 mg | ORAL_TABLET | Freq: Four times a day (QID) | ORAL | Status: DC | PRN

## 2013-06-02 MED ORDER — OXYCODONE-ACETAMINOPHEN 10-325 MG PO TABS: 1.0000 | ORAL_TABLET | Freq: Four times a day (QID) | ORAL | Status: DC | PRN

## 2013-06-02 MED ORDER — OXYCODONE HCL 5 MG PO TABS
ORAL_TABLET | ORAL | Status: AC
  Filled 2013-06-02: qty 1

## 2013-06-02 MED ORDER — LIDOCAINE HCL (CARDIAC) 20 MG/ML IV SOLN
INTRAVENOUS | Status: DC | PRN
  Administered 2013-06-02: 60 mg via INTRAVENOUS

## 2013-06-02 MED ORDER — FENTANYL CITRATE 0.05 MG/ML IJ SOLN
INTRAMUSCULAR | Status: DC | PRN
  Administered 2013-06-02: 50 ug via INTRAVENOUS
  Administered 2013-06-02: 25 ug via INTRAVENOUS
  Administered 2013-06-02: 50 ug via INTRAVENOUS
  Administered 2013-06-02: 25 ug via INTRAVENOUS
  Administered 2013-06-02: 50 ug via INTRAVENOUS

## 2013-06-02 MED ORDER — POLYETHYLENE GLYCOL 3350 17 G PO PACK: 17.0000 g | PACK | Freq: Every day | ORAL | Status: DC | PRN

## 2013-06-02 MED ORDER — HYDROMORPHONE HCL PF 1 MG/ML IJ SOLN
0.2500 mg | INTRAMUSCULAR | Status: DC | PRN
  Administered 2013-06-02: 0.5 mg via INTRAVENOUS

## 2013-06-02 MED ORDER — GABAPENTIN 300 MG PO CAPS
300.0000 mg | ORAL_CAPSULE | Freq: Every day | ORAL | Status: DC
  Administered 2013-06-02: 300 mg via ORAL
  Filled 2013-06-02 (×2): qty 1

## 2013-06-02 MED ORDER — DOCUSATE SODIUM 100 MG PO CAPS
100.0000 mg | ORAL_CAPSULE | Freq: Every day | ORAL | Status: DC
  Administered 2013-06-02: 100 mg via ORAL
  Filled 2013-06-02: qty 1

## 2013-06-02 MED ORDER — PROPOFOL 10 MG/ML IV BOLUS
INTRAVENOUS | Status: AC
  Filled 2013-06-02: qty 20

## 2013-06-02 MED ORDER — POTASSIUM CHLORIDE IN NACL 20-0.45 MEQ/L-% IV SOLN
INTRAVENOUS | Status: DC
  Administered 2013-06-02 – 2013-06-03 (×2): via INTRAVENOUS
  Filled 2013-06-02 (×4): qty 1000

## 2013-06-02 MED ORDER — HYDROMORPHONE HCL PF 1 MG/ML IJ SOLN: 0.5000 mg | Freq: Once | INTRAMUSCULAR | Status: DC

## 2013-06-02 MED ORDER — FENTANYL CITRATE 0.05 MG/ML IJ SOLN
INTRAMUSCULAR | Status: AC
  Filled 2013-06-02: qty 5

## 2013-06-02 MED ORDER — LACTATED RINGERS IV SOLN
INTRAVENOUS | Status: DC
  Administered 2013-06-02 (×2): via INTRAVENOUS

## 2013-06-02 MED ORDER — PANTOPRAZOLE SODIUM 40 MG PO TBEC
40.0000 mg | DELAYED_RELEASE_TABLET | Freq: Every day | ORAL | Status: DC
  Administered 2013-06-02 – 2013-06-03 (×2): 40 mg via ORAL
  Filled 2013-06-02 (×2): qty 1

## 2013-06-02 MED ORDER — MUPIROCIN 2 % EX OINT
TOPICAL_OINTMENT | Freq: Two times a day (BID) | CUTANEOUS | Status: DC
  Filled 2013-06-02: qty 22

## 2013-06-02 MED ORDER — ACETAMINOPHEN 325 MG PO TABS: 650.0000 mg | ORAL_TABLET | Freq: Four times a day (QID) | ORAL | Status: DC | PRN

## 2013-06-02 MED ORDER — ROPINIROLE HCL 1 MG PO TABS
2.0000 mg | ORAL_TABLET | Freq: Three times a day (TID) | ORAL | Status: DC
  Administered 2013-06-02 – 2013-06-03 (×2): 2 mg via ORAL
  Filled 2013-06-02 (×7): qty 2

## 2013-06-02 MED ORDER — METOPROLOL TARTRATE 25 MG PO TABS
25.0000 mg | ORAL_TABLET | Freq: Once | ORAL | Status: AC
  Administered 2013-06-02: 25 mg via ORAL

## 2013-06-02 MED ORDER — CEFAZOLIN SODIUM-DEXTROSE 2-3 GM-% IV SOLR
INTRAVENOUS | Status: DC | PRN
  Administered 2013-06-02: 2 g via INTRAVENOUS

## 2013-06-02 MED ORDER — BISACODYL 10 MG RE SUPP: 10.0000 mg | Freq: Every day | RECTAL | Status: DC | PRN

## 2013-06-02 MED ORDER — PHENOL 1.4 % MT LIQD: 1.0000 | OROMUCOSAL | Status: DC | PRN

## 2013-06-02 MED ORDER — METOCLOPRAMIDE HCL 5 MG/ML IJ SOLN: 5.0000 mg | Freq: Three times a day (TID) | INTRAMUSCULAR | Status: DC | PRN

## 2013-06-02 MED ORDER — OXYCODONE HCL 5 MG/5ML PO SOLN: 5.0000 mg | Freq: Once | ORAL | Status: AC | PRN

## 2013-06-02 MED ORDER — METOPROLOL TARTRATE 12.5 MG HALF TABLET
ORAL_TABLET | ORAL | Status: AC
  Filled 2013-06-02: qty 2

## 2013-06-02 MED ORDER — ASPIRIN EC 81 MG PO TBEC
81.0000 mg | DELAYED_RELEASE_TABLET | Freq: Every day | ORAL | Status: DC
  Administered 2013-06-02 – 2013-06-03 (×2): 81 mg via ORAL
  Filled 2013-06-02 (×2): qty 1

## 2013-06-02 MED ORDER — PHENYLEPHRINE HCL 10 MG/ML IJ SOLN
INTRAMUSCULAR | Status: DC | PRN
  Administered 2013-06-02 (×2): 80 ug via INTRAVENOUS

## 2013-06-02 MED ORDER — ARTIFICIAL TEARS OP OINT
TOPICAL_OINTMENT | OPHTHALMIC | Status: AC
  Filled 2013-06-02: qty 3.5

## 2013-06-02 MED ORDER — METHOCARBAMOL 500 MG PO TABS
ORAL_TABLET | ORAL | Status: AC
  Filled 2013-06-02: qty 1

## 2013-06-02 MED ORDER — MENTHOL 3 MG MT LOZG: 1.0000 | LOZENGE | OROMUCOSAL | Status: DC | PRN

## 2013-06-02 MED ORDER — MIDAZOLAM HCL 5 MG/5ML IJ SOLN
INTRAMUSCULAR | Status: DC | PRN
  Administered 2013-06-02 (×2): 1 mg via INTRAVENOUS

## 2013-06-02 MED ORDER — FENTANYL CITRATE 0.05 MG/ML IJ SOLN
INTRAMUSCULAR | Status: AC
  Administered 2013-06-02: 100 ug
  Filled 2013-06-02: qty 2

## 2013-06-02 MED ORDER — HYDROMORPHONE HCL PF 1 MG/ML IJ SOLN
0.2500 mg | INTRAMUSCULAR | Status: DC | PRN
  Administered 2013-06-02 (×4): 0.5 mg via INTRAVENOUS

## 2013-06-02 MED ORDER — ALUM & MAG HYDROXIDE-SIMETH 200-200-20 MG/5ML PO SUSP: 30.0000 mL | ORAL | Status: DC | PRN

## 2013-06-02 MED ORDER — ONDANSETRON HCL 4 MG/2ML IJ SOLN
INTRAMUSCULAR | Status: DC | PRN
  Administered 2013-06-02: 4 mg via INTRAVENOUS

## 2013-06-02 MED ORDER — ONDANSETRON HCL 4 MG/2ML IJ SOLN: 4.0000 mg | Freq: Four times a day (QID) | INTRAMUSCULAR | Status: DC | PRN

## 2013-06-02 MED ORDER — CEFAZOLIN SODIUM-DEXTROSE 2-3 GM-% IV SOLR
INTRAVENOUS | Status: AC
  Filled 2013-06-02: qty 50

## 2013-06-02 MED ORDER — MAGNESIUM CITRATE PO SOLN: 1.0000 | Freq: Once | ORAL | Status: AC | PRN

## 2013-06-02 MED ORDER — ONDANSETRON HCL 4 MG/2ML IJ SOLN: 4.0000 mg | Freq: Once | INTRAMUSCULAR | Status: DC | PRN

## 2013-06-02 MED ORDER — ONDANSETRON HCL 4 MG PO TABS: 4.0000 mg | ORAL_TABLET | Freq: Four times a day (QID) | ORAL | Status: DC | PRN

## 2013-06-02 MED ORDER — MIDAZOLAM HCL 2 MG/2ML IJ SOLN
INTRAMUSCULAR | Status: AC
  Filled 2013-06-02: qty 2

## 2013-06-02 SURGICAL SUPPLY — 66 items
APL SKNCLS STERI-STRIP NONHPOA (GAUZE/BANDAGES/DRESSINGS) ×1
BANDAGE ELASTIC 6 VELCRO ST LF (GAUZE/BANDAGES/DRESSINGS) ×2 IMPLANT
BANDAGE ESMARK 6X9 LF (GAUZE/BANDAGES/DRESSINGS) ×1 IMPLANT
BENZOIN TINCTURE PRP APPL 2/3 (GAUZE/BANDAGES/DRESSINGS) ×2 IMPLANT
BIT DRILL QUICK REL 1/8 2PK SL (DRILL) IMPLANT
BLADE SAW RECIP 87.9 MT (BLADE) IMPLANT
BNDG CMPR 9X6 STRL LF SNTH (GAUZE/BANDAGES/DRESSINGS) ×1
BNDG ESMARK 6X9 LF (GAUZE/BANDAGES/DRESSINGS) ×3
BOOTCOVER CLEANROOM LRG (PROTECTIVE WEAR) ×6 IMPLANT
BOWL SMART MIX CTS (DISPOSABLE) ×6 IMPLANT
CAPT KNEE OXFORD ×2 IMPLANT
CEMENT HV SMART SET (Cement) ×4 IMPLANT
CLOSURE STERI-STRIP 1/2X4 (GAUZE/BANDAGES/DRESSINGS) ×1
CLSR STERI-STRIP ANTIMIC 1/2X4 (GAUZE/BANDAGES/DRESSINGS) ×3 IMPLANT
COVER SURGICAL LIGHT HANDLE (MISCELLANEOUS) ×3 IMPLANT
CUFF TOURNIQUET SINGLE 34IN LL (TOURNIQUET CUFF) ×2 IMPLANT
DRAPE EXTREMITY T 121X128X90 (DRAPE) ×3 IMPLANT
DRAPE PROXIMA HALF (DRAPES) ×3 IMPLANT
DRAPE U-SHAPE 47X51 STRL (DRAPES) ×3 IMPLANT
DRILL QUICK RELEASE 1/8 INCH (DRILL) ×2
DRSG PAD ABDOMINAL 8X10 ST (GAUZE/BANDAGES/DRESSINGS) ×3 IMPLANT
DURAPREP 26ML APPLICATOR (WOUND CARE) ×3 IMPLANT
ELECT CAUTERY BLADE 6.4 (BLADE) ×3 IMPLANT
ELECT REM PT RETURN 9FT ADLT (ELECTROSURGICAL) ×3
ELECTRODE REM PT RTRN 9FT ADLT (ELECTROSURGICAL) ×1 IMPLANT
EVACUATOR 1/8 PVC DRAIN (DRAIN) IMPLANT
FACESHIELD LNG OPTICON STERILE (SAFETY) ×3 IMPLANT
GLOVE BIOGEL M 7.0 STRL (GLOVE) ×3 IMPLANT
GLOVE BIOGEL PI IND STRL 7.5 (GLOVE) ×1 IMPLANT
GLOVE BIOGEL PI INDICATOR 7.5 (GLOVE) ×2
GLOVE BIOGEL PI ORTHO PRO SZ8 (GLOVE) ×4
GLOVE ORTHO TXT STRL SZ7.5 (GLOVE) ×3 IMPLANT
GLOVE PI ORTHO PRO STRL SZ8 (GLOVE) ×2 IMPLANT
GLOVE SURG ORTHO 8.0 STRL STRW (GLOVE) ×6 IMPLANT
GOWN STRL NON-REIN LRG LVL3 (GOWN DISPOSABLE) ×2 IMPLANT
GOWN STRL REUS W/TWL 2XL LVL3 (GOWN DISPOSABLE) ×2 IMPLANT
HANDPIECE INTERPULSE COAX TIP (DISPOSABLE) ×3
HOOD PEEL AWAY FACE SHEILD DIS (HOOD) ×9 IMPLANT
IMMOBILIZER KNEE 22 UNIV (SOFTGOODS) ×2 IMPLANT
KIT BASIN OR (CUSTOM PROCEDURE TRAY) ×3 IMPLANT
KIT ROOM TURNOVER OR (KITS) ×3 IMPLANT
MANIFOLD NEPTUNE II (INSTRUMENTS) ×3 IMPLANT
NS IRRIG 1000ML POUR BTL (IV SOLUTION) ×3 IMPLANT
PACK TOTAL JOINT (CUSTOM PROCEDURE TRAY) ×3 IMPLANT
PAD ABD 8X10 STRL (GAUZE/BANDAGES/DRESSINGS) ×2 IMPLANT
PAD ARMBOARD 7.5X6 YLW CONV (MISCELLANEOUS) ×6 IMPLANT
PAD CAST 4YDX4 CTTN HI CHSV (CAST SUPPLIES) ×1 IMPLANT
PADDING CAST COTTON 4X4 STRL (CAST SUPPLIES) ×3
PADDING CAST COTTON 6X4 STRL (CAST SUPPLIES) ×3 IMPLANT
RUBBERBAND STERILE (MISCELLANEOUS) ×3 IMPLANT
SAWBLADE OXFORD PARTIAL (BLADE) ×2 IMPLANT
SET HNDPC FAN SPRY TIP SCT (DISPOSABLE) ×1 IMPLANT
SPONGE GAUZE 4X4 12PLY (GAUZE/BANDAGES/DRESSINGS) ×3 IMPLANT
STAPLER VISISTAT 35W (STAPLE) ×3 IMPLANT
SUCTION FRAZIER TIP 10 FR DISP (SUCTIONS) ×3 IMPLANT
SUT MNCRL AB 4-0 PS2 18 (SUTURE) ×3 IMPLANT
SUT VIC AB 0 CT1 27 (SUTURE) ×6
SUT VIC AB 0 CT1 27XBRD ANBCTR (SUTURE) ×2 IMPLANT
SUT VIC AB 1 CT1 27 (SUTURE) ×6
SUT VIC AB 1 CT1 27XBRD ANBCTR (SUTURE) ×2 IMPLANT
SUT VIC AB 3-0 SH 8-18 (SUTURE) ×3 IMPLANT
SYR 30ML LL (SYRINGE) ×3 IMPLANT
TOWEL OR 17X24 6PK STRL BLUE (TOWEL DISPOSABLE) ×3 IMPLANT
TOWEL OR 17X26 10 PK STRL BLUE (TOWEL DISPOSABLE) ×3 IMPLANT
TRAY FOLEY CATH 16FRSI W/METER (SET/KITS/TRAYS/PACK) IMPLANT
WATER STERILE IRR 1000ML POUR (IV SOLUTION) ×3 IMPLANT

## 2013-06-02 NOTE — Anesthesia Procedure Notes (Addendum)
Procedure Name: LMA Insertion Date/Time: 06/02/2013 10:35 AM Performed by: Storm Frisk ELLEN-ELIZABETH Pre-anesthesia Checklist: Patient identified, Timeout performed, Emergency Drugs available, Suction available and Patient being monitored Patient Re-evaluated:Patient Re-evaluated prior to inductionOxygen Delivery Method: Circle system utilized Preoxygenation: Pre-oxygenation with 100% oxygen Intubation Type: IV induction and Inhalational induction Ventilation: Two handed mask ventilation required and Oral airway inserted - appropriate to patient size LMA: LMA inserted LMA Size: 4.0 Number of attempts: 2 Tube secured with: Tape Dental Injury: Teeth and Oropharynx as per pre-operative assessment  Comments: Unable to pass LMA upon first attempt.  Pt. Masked with inhalational agent to deepen level of anesthesia and then LMA successfully passed upon second attempt.  Minimal blood in posterior oropharnyx; suctioned prior to LMA insertion.  Pt. Required two-handed mask d/t large beard.   Anesthesia Regional Block:  Popliteal block  Pre-Anesthetic Checklist: ,, timeout performed, Correct Patient, Correct Site, Correct Laterality, Correct Procedure, Correct Position, site marked, Risks and benefits discussed,  Surgical consent,  Pre-op evaluation,  At surgeon's request and post-op pain management  Laterality: Right  Prep: chloraprep       Needles:  Injection technique: Single-shot  Needle Type: Echogenic Stimulator Needle     Needle Length: 9cm 9 cm Needle Gauge: 22 and 22 G    Additional Needles: Popliteal block Narrative:

## 2013-06-02 NOTE — Progress Notes (Signed)
Utilization review completed.  

## 2013-06-02 NOTE — Plan of Care (Signed)
Problem: Consults Goal: Diagnosis- Total Joint Replacement Partial/Uniknee Right

## 2013-06-02 NOTE — Progress Notes (Signed)
Orthopedic Tech Progress Note Patient Details:  Jason [name] 11/25/1945 [id number]  Patient ID: [name], male   DOB: 11/27/1945, 68 y.o.   MRN: [id number]   [name] 06/02/2013, 5:31 PMTrapeze bar

## 2013-06-02 NOTE — Anesthesia Preprocedure Evaluation (Signed)
Anesthesia Evaluation  Patient identified by MRN, date of birth, ID band Patient awake    Reviewed: Allergy & Precautions, H&P , NPO status , Patient's Chart, lab work & pertinent test results, reviewed documented beta blocker date and time   Airway Mallampati: III TM Distance: >3 FB Neck ROM: Full    Dental  (+) Teeth Intact, Missing, Dental Advisory Given   Pulmonary former smoker,          Cardiovascular hypertension, Rhythm:Regular Rate:Normal     Neuro/Psych    GI/Hepatic   Endo/Other    Renal/GU      Musculoskeletal   Abdominal   Peds  Hematology   Anesthesia Other Findings   Reproductive/Obstetrics                           Anesthesia Physical Anesthesia Plan  ASA: II  Anesthesia Plan: General and Regional   Post-op Pain Management:    Induction: Intravenous  Airway Management Planned: LMA  Additional Equipment:   Intra-op Plan:   Post-operative Plan: Extubation in OR  Informed Consent: I have reviewed the patients History and Physical, chart, labs and discussed the procedure including the risks, benefits and alternatives for the proposed anesthesia with the patient or authorized representative who has indicated his/her understanding and acceptance.   Dental advisory given  Plan Discussed with: CRNA and Anesthesiologist  Anesthesia Plan Comments:         Anesthesia Quick Evaluation

## 2013-06-02 NOTE — H&P (Signed)
PREOPERATIVE H&P  Chief Complaint: RIGHT KNEE DJD  HPI: Jason [name] is a 68 y.o. male who presents for preoperative history and physical with a diagnosis of RIGHT KNEE DJD. Symptoms are rated as moderate to severe, and have been worsening.  This is significantly impairing activities of daily living.  He has elected for surgical management. He has failed injections, activity modification, anti-inflammatories, among others. X-rays demonstrated end-stage degenerative changes on the medial compartment.  Past Medical History  Diagnosis Date  . Allergic rhinitis, cause unspecified   . Sleep apnea   . Migraine, unspecified, without mention of intractable migraine without mention of status migrainosus   . Restless legs   . Insomnia   . Low blood pressure reading     about 1 year ago  . Arthritis   . GERD (gastroesophageal reflux disease)   . Hypertension    Past Surgical History  Procedure Laterality Date  . Tonsillectomy and adenoidectomy     History   Social History  . Marital Status: Married    Spouse Name: N/A    Number of Children: 2  . Years of Education: N/A   Social History Main Topics  . Smoking status: Former Smoker    Types: Pipe  . Smokeless tobacco: None  . Alcohol Use: 1.8 oz/week    3 Shots of liquor per week     Comment: 2-3 a week  . Drug Use: No  . Sexual Activity: None   Other Topics Concern  . None   Social History Narrative   Retiring from [employer].     Family History  Problem Relation Age of Onset  . Emphysema Father     Died age 38  . Sudden death Mother     Died age 71  . CAD Brother     Died age 8  . Hypertension Brother    Allergies  Allergen Reactions  . Augmentin [Amoxicillin-Pot Clavulanate]     Nausea   . Flexeril [Cyclobenzaprine]     Fatigue and sleepiness  . Penicillins    Prior to Admission medications   Medication Sig Start Date End Date Taking? Authorizing Provider  aspirin EC 81 MG tablet Take 81 mg by mouth daily.    Yes Historical Provider, MD  diclofenac (VOLTAREN) 75 MG EC tablet Take 75 mg by mouth 2 (two) times daily.   Yes Historical Provider, MD  docusate sodium (DOCQLACE) 100 MG capsule Take 100 mg by mouth at bedtime.   Yes Historical Provider, MD  gabapentin (NEURONTIN) 300 MG capsule Take 300 mg by mouth at bedtime.   Yes Historical Provider, MD  metoprolol tartrate (LOPRESSOR) 25 MG tablet Take 25 mg by mouth.   Yes Historical Provider, MD  Multiple Vitamin (MULTIVITAMIN) tablet Take 1 tablet by mouth daily.   Yes Historical Provider, MD  omeprazole (PRILOSEC) 20 MG capsule Take 20 mg by mouth 2 (two) times daily.   Yes Historical Provider, MD  rOPINIRole (REQUIP) 2 MG tablet Take 2 mg by mouth 3 (three) times daily.    Yes Historical Provider, MD  zolpidem (AMBIEN) 10 MG tablet Take 10 mg by mouth at bedtime as needed for sleep.    Yes Historical Provider, MD  carbidopa-levodopa (SINEMET IR) 25-100 MG per tablet Take 1 tablet by mouth 2 (two) times daily as needed.     Historical Provider, MD     Positive ROS: All other systems have been reviewed and were otherwise negative with the exception of those mentioned in  the HPI and as above.  Physical Exam: General: Alert, no acute distress Cardiovascular: No pedal edema Respiratory: No cyanosis, no use of accessory musculature GI: No organomegaly, abdomen is soft and non-tender Skin: No lesions in the area of chief complaint Neurologic: Sensation intact distally Psychiatric: Patient is competent for consent with normal mood and affect Lymphatic: No axillary or cervical lymphadenopathy  MUSCULOSKELETAL: right knee has range of motion 0-125, pseudolaxity to valgus testing, positive pain medially.  Assessment: RIGHT KNEE DJD  Plan: Plan for Procedure(s): RIGHT UNICOMPARTMENTAL KNEE  The risks benefits and alternatives were discussed with the patient including but not limited to the risks of nonoperative treatment, versus surgical  intervention including infection, bleeding, nerve injury,  blood clots, cardiopulmonary complications, morbidity, mortality, among others, and they were willing to proceed.   Johnny Bridge, MD Cell [phone]   06/02/2013 9:36 AM

## 2013-06-02 NOTE — Preoperative (Signed)
Beta Blockers   Reason not to administer Beta Blockers:Not Applicable 

## 2013-06-02 NOTE — Op Note (Signed)
06/02/2013  12:05 PM  PATIENT:  Jason [name] DIAGNOSIS:  RIGHT KNEE DJD  POST-OPERATIVE DIAGNOSIS:  Same  PROCEDURE:  RIGHT UNICOMPARTMENTAL KNEE  SURGEON:  Johnny Bridge, MD  PHYSICIAN ASSISTANT: Joya Gaskins, OPA-C, present and scrubbed throughout the case, critical for completion in a timely fashion, and for retraction, instrumentation, and closure.  ANESTHESIA:   General  PREOPERATIVE INDICATIONS:  Jason [name] is a  68 y.o. male with a diagnosis of RIGHT KNEE DJD who failed conservative measures and elected for surgical management.    The risks benefits and alternatives were discussed with the patient preoperatively including but not limited to the risks of infection, bleeding, nerve injury, cardiopulmonary complications, blood clots, the need for revision surgery, among others, and the patient was willing to proceed.  OPERATIVE IMPLANTS: [employer] mobile bearing medial compartment arthroplasty femur size medium, tibia size C, bearing size 4.  OPERATIVE FINDINGS: Endstage grade 4 medial compartment osteoarthritis. No significant changes in the lateral or patellofemoral joint.  The ACL was intact.  OPERATIVE PROCEDURE: The patient was brought to the operating room placed in supine position. General anesthesia was administered. IV antibiotics were given. The lower extremity was placed in the legholder and prepped and draped in usual sterile fashion.  Time out was performed.  The leg was elevated and exsanguinated and the tourniquet was inflated. Anteromedial incision was performed, and I took care to preserve the MCL. Parapatellar incision was carried out, and the osteophytes were excised, along with the medial meniscus and a small portion of the fat pad.  The extra medullary tibial cutting jig was applied, using the spoon and the 62mm G-Clamp and the 2 mm shim, and I took care to protect the anterior cruciate ligament insertion and the tibial spine. The  medial collateral ligament was also protected, and I resected my proximal tibia, matching the anatomic slope.   The proximal tibial bony cut was removed in one piece, and I turned my attention to the femur.  The intramedullary femoral rod was placed using the drill, and then using the appropriate reference, I assembled the femoral jig, setting my posterior cutting block. I resected my posterior femur, used the 0 spigot for the anterior femur, and then measured my gap.   I then used the appropriate mill to match the extension gap to the flexion gap. The gaps were then measured again with the appropriate feeler gauges. Once I had balanced flexion and extension gaps, I then completed the preparation of the femur.  I milled off the anterior aspect of the distal femur to prevent impingement. I also exposed the tibia, and selected the above-named component, and then used the cutting jig to prepare the keel slot on the tibia. I also used the awl to curette out the bone to complete the preparation of the keel. The back wall was intact.  I then placed trial components, and it was found to have excellent motion, and appropriate balance.  I then cemented the components into place, cementing the tibia first, removing all excess cement, and then cementing the femur.  All loose cement was removed.  The real polyethylene insert was applied manually, and the knee was taken through functional range of motion, and found to have excellent stability and restoration of joint motion, with excellent balance.  The wounds were irrigated copiously, and the parapatellar tissue closed with Vicryl, followed by Vicryl for the subcutaneous tissue, with routine closure with Steri-Strips and sterile gauze.  The  tourniquet was released, and the patient was awakened and extubated and returned to PACU in stable and satisfactory condition. There were no complications.

## 2013-06-02 NOTE — Progress Notes (Signed)
Pt does not feel adequate pain relief after meds. Dr Linna Caprice updated. New order for additional 1 mg IV Dilaudid. Will cont to monitor.

## 2013-06-02 NOTE — Transfer of Care (Signed)
Immediate Anesthesia Transfer of Care Note  Patient: Jason [name]  Procedure(s) Performed: Procedure(s): RIGHT UNICOMPARTMENTAL KNEE (Right)  Patient Location: PACU  Anesthesia Type:General  Level of Consciousness: lethargic and responds to stimulation  Airway & Oxygen Therapy: Patient Spontanous Breathing and Patient connected to nasal cannula oxygen  Post-op Assessment: Report given to PACU RN and Post -op Vital signs reviewed and stable  Post vital signs: Reviewed and stable  Complications: No apparent anesthesia complications

## 2013-06-02 NOTE — Progress Notes (Signed)
Lunch relief by S. Gregson RN 

## 2013-06-02 NOTE — Discharge Instructions (Signed)
Diet: As you were doing prior to hospitalization  ° °Shower:  May shower but keep the wounds dry, use an occlusive plastic wrap, NO SOAKING IN TUB.  If the bandage gets wet, change with a clean dry gauze. ° °Dressing:  You may change your dressing 3-5 days after surgery.  Then change the dressing daily with sterile gauze dressing.   ° °There are sticky tapes (steri-strips) on your wounds and all the stitches are absorbable.  Leave the steri-strips in place when changing your dressings, they will peel off with time, usually 2-3 weeks. ° °Activity:  Increase activity slowly as tolerated, but follow the weight bearing instructions below.  No lifting or driving for 6 weeks. ° °Weight Bearing:   As tolerated.   ° °To prevent constipation: you may use a stool softener such as - ° °Colace (over the counter) 100 mg by mouth twice a day  °Drink plenty of fluids (prune juice may be helpful) and high fiber foods °Miralax (over the counter) for constipation as needed.   ° °Itching:  If you experience itching with your medications, try taking only a single pain pill, or even half a pain pill at a time.  You may take up to 10 pain pills per day, and you can also use benadryl over the counter for itching or also to help with sleep.  ° °Precautions:  If you experience chest pain or shortness of breath - call 911 immediately for transfer to the hospital emergency department!! ° °If you develop a fever greater that 101 F, purulent drainage from wound, increased redness or drainage from wound, or calf pain -- Call the office at [phone]                                                °Follow- Up Appointment:  Please call for an appointment to be seen in 2 weeks [place] - [phone] ° ° ° ° ° °

## 2013-06-03 LAB — CBC
HCT: 38.9 % — ABNORMAL LOW (ref 39.0–52.0)
HEMOGLOBIN: 13.4 g/dL (ref 13.0–17.0)
MCH: 31.8 pg (ref 26.0–34.0)
MCHC: 34.4 g/dL (ref 30.0–36.0)
MCV: 92.4 fL (ref 78.0–100.0)
PLATELETS: 236 10*3/uL (ref 150–400)
RBC: 4.21 MIL/uL — AB (ref 4.22–5.81)
RDW: 14.5 % (ref 11.5–15.5)
WBC: 15.1 10*3/uL — AB (ref 4.0–10.5)

## 2013-06-03 LAB — BASIC METABOLIC PANEL
BUN: 20 mg/dL (ref 6–23)
CALCIUM: 9 mg/dL (ref 8.4–10.5)
CHLORIDE: 99 meq/L (ref 96–112)
CO2: 23 mEq/L (ref 19–32)
Creatinine, Ser: 0.72 mg/dL (ref 0.50–1.35)
GFR calc Af Amer: >90 mL/min (ref >90)
GFR calc non Af Amer: >90 mL/min (ref >90)
GLUCOSE: 150 mg/dL — AB (ref 70–99)
POTASSIUM: 4.6 meq/L (ref 3.7–5.3)
SODIUM: 137 meq/L (ref 137–147)

## 2013-06-03 NOTE — Progress Notes (Signed)
Physical Therapy Treatment Patient Details Name: Jason [name] MRN: [id number] DOB: May 24, 1945 Today's Date: 06/03/2013 Time: 4496-7591 PT Time Calculation (min): 19 min  PT Assessment / Plan / Recommendation  History of Present Illness 68 y.o. s/p  Rt Total Joint Replacement Partial/Uniknee   PT Comments   Pt progressing very well, is mod I level with mobility and safe for d.c home to be followed by HHPT  Follow Up Recommendations  Home health PT     Does the patient have the potential to tolerate intense rehabilitation     Barriers to Discharge        Equipment Recommendations  Rolling walker with 5" wheels    Recommendations for Other Services    Frequency 7X/week   Progress towards PT Goals Progress towards PT goals: Goals met/education completed, patient discharged from PT  Plan Current plan remains appropriate    Precautions / Restrictions Precautions Precautions: Knee Precaution Comments: Reviewed precautions with pt Restrictions Weight Bearing Restrictions: Yes RLE Weight Bearing: Weight bearing as tolerated   Pertinent Vitals/Pain VSS    Mobility  Bed Mobility Overal bed mobility: Modified Independent General bed mobility comments: in and out of bed without physical assist Transfers Overall transfer level: Modified independent Equipment used: Rolling walker (2 wheeled) Transfers: Sit to/from Stand Sit to Stand: Modified independent (Device/Increase time) General transfer comment: safe hand placement Ambulation/Gait Ambulation/Gait assistance: Modified independent (Device/Increase time) Ambulation Distance (Feet): 250 Feet Assistive device: Rolling walker (2 wheeled) Gait Pattern/deviations: Step-through pattern;Decreased stance time - right;Decreased step length - right Gait velocity: decreased Gait velocity interpretation: Below normal speed for age/gender General Gait Details: pt to continue to work on gait pattern  Stairs: Yes Stairs assistance: Min  guard Stair Management: One rail Right;Step to pattern;Forwards Number of Stairs: 5 General stair comments: safe with stairs, vc's for sequencing    Exercises Total Joint Exercises Ankle Circles/Pumps: AROM;Both;10 reps;Seated Quad Sets: AROM;Both;10 reps;Seated Gluteal Sets: AROM;10 reps;Seated Heel Slides: AROM;Right;10 reps;Seated Hip ABduction/ADduction: AROM;Right;10 reps;Seated Straight Leg Raises: AROM;Right;10 reps;Seated Long Arc Quad: AROM;Right;10 reps;Seated Knee Flexion: AAROM;5 reps;Seated Goniometric ROM: 0-90   PT Diagnosis: Difficulty walking;Abnormality of gait;Acute pain  PT Problem List: Decreased strength;Decreased range of motion;Decreased activity tolerance;Decreased mobility;Decreased knowledge of use of DME;Decreased knowledge of precautions;Pain PT Treatment Interventions: DME instruction;Gait training;Stair training;Functional mobility training;Therapeutic activities;Therapeutic exercise;Neuromuscular re-education;Patient/family education   PT Goals (current goals can now be found in the care plan section) Acute Rehab PT Goals Patient Stated Goal: return home PT Goal Formulation: With patient Time For Goal Achievement: 06/10/13 Potential to Achieve Goals: Good  Visit Information  Last PT Received On: 06/03/13 Assistance Needed: +1 History of Present Illness: 68 y.o. s/p  Rt Total Joint Replacement Partial/Uniknee    Subjective Data  Subjective: pt feeling ready to go home Patient Stated Goal: return home   Cognition  Cognition Arousal/Alertness: Awake/alert Behavior During Therapy: WFL for tasks assessed/performed Overall Cognitive Status: Within Functional Limits for tasks assessed    Balance  Balance Overall balance assessment: No apparent balance deficits (not formally assessed)  End of Session PT - End of Session Activity Tolerance: Patient tolerated treatment well Patient left: in chair;with call bell/phone within reach;with  family/visitor present Nurse Communication: Mobility status   GP   Leighton Roach, [place]  [place], [place] 06/03/2013, 1:56 PM

## 2013-06-03 NOTE — Care Management Note (Signed)
CARE MANAGEMENT NOTE 06/03/2013  Patient:  ABIMAEL, ZEITER   Account Number:  [id number]  Date Initiated:  06/03/2013  Documentation initiated by:  Ricki Miller  Subjective/Objective Assessment:   68 yr old male s/p right unicompartmental knee     Action/Plan:   Case manager spoke with patient concerning home health and DME needs at discharge. Choice offered. Referral called to [facility], [place].Patient has family support at discharge.   Anticipated DC Date:  06/03/2013   Anticipated DC Plan:  [place]  CM consult      [facility] Choice  HOME HEALTH  DURABLE MEDICAL EQUIPMENT   Choice offered to / List presented to:  C-1 Patient   DME arranged  3-N-1  [place]      DME agency  [employer]     HH arranged  HH-2 PT      [place].   Status of service:  Completed, signed off Medicare Important Message given?   (If response is "NO", the following Medicare IM given date fields will be blank) Date Medicare IM given:   Date Additional Medicare IM given:    Discharge Disposition:  [place]  Per UR Regulation:    If discussed at Long Length of Stay Meetings, dates discussed:    Comments:

## 2013-06-03 NOTE — Evaluation (Signed)
Physical Therapy Evaluation Patient Details Name: Jason [name] MRN: [id number] DOB: May 23, 1945 Today's Date: 06/03/2013 Time: 1000-1035 PT Time Calculation (min): 35 min  PT Assessment / Plan / Recommendation History of Present Illness  68 y.o. s/p  Rt Total Joint Replacement Partial/Uniknee  Clinical Impression  Pt mobilizing well with RW, recommend RW for d/c home. Worked on appropriate gait pattern as pt has been favoring RLE for several months. Plan to see pt one more time before d.c home later today. Recommend HHPT at d/c.    PT Assessment  Patient needs continued PT services    Follow Up Recommendations  Home health PT    Does the patient have the potential to tolerate intense rehabilitation      Barriers to Discharge        Equipment Recommendations  Rolling walker with 5" wheels    Recommendations for Other Services     Frequency 7X/week    Precautions / Restrictions Precautions Precautions: Knee Precaution Comments: Reviewed precautions with pt Restrictions Weight Bearing Restrictions: Yes RLE Weight Bearing: Weight bearing as tolerated   Pertinent Vitals/Pain 5/10 right knee pain, RN notified      Mobility  Bed Mobility General bed mobility comments: pt in chair Transfers Overall transfer level: Needs assistance Equipment used: Rolling walker (2 wheeled) Transfers: Sit to/from Stand Sit to Stand: Supervision General transfer comment: vc's for hand placement Ambulation/Gait Ambulation/Gait assistance: Supervision Ambulation Distance (Feet): 200 Feet Assistive device: Rolling walker (2 wheeled) Gait Pattern/deviations: Step-to pattern;Decreased stance time - right;Decreased step length - right Gait velocity: decreased Gait velocity interpretation: Below normal speed for age/gender General Gait Details: educated pt on proper gait pattern, paying attention to allow right knee flexion with swing-through phase and working on heel strike with extended  knee Stairs: Yes Stairs assistance: Min guard Stair Management: One rail Right;Step to pattern;Forwards Number of Stairs: 5 General stair comments: safe with stairs, vc's for sequencing    Exercises Total Joint Exercises Ankle Circles/Pumps: AROM;Both;10 reps;Seated Quad Sets: AROM;Both;10 reps;Seated Gluteal Sets: AROM;10 reps;Seated Heel Slides: AROM;Right;10 reps;Seated Hip ABduction/ADduction: AROM;Right;10 reps;Seated Straight Leg Raises: AROM;Right;10 reps;Seated Long Arc Quad: AROM;Right;10 reps;Seated Knee Flexion: AAROM;5 reps;Seated Goniometric ROM: 0-90   PT Diagnosis: Difficulty walking;Abnormality of gait;Acute pain  PT Problem List: Decreased strength;Decreased range of motion;Decreased activity tolerance;Decreased mobility;Decreased knowledge of use of DME;Decreased knowledge of precautions;Pain PT Treatment Interventions: DME instruction;Gait training;Stair training;Functional mobility training;Therapeutic activities;Therapeutic exercise;Neuromuscular re-education;Patient/family education     PT Goals(Current goals can be found in the care plan section) Acute Rehab PT Goals Patient Stated Goal: return home PT Goal Formulation: With patient Time For Goal Achievement: 06/10/13 Potential to Achieve Goals: Good  Visit Information  Last PT Received On: 06/03/13 Assistance Needed: +1 History of Present Illness: 68 y.o. s/p  Rt Total Joint Replacement Partial/Uniknee       Prior [place] expects to be discharged to:: Private residence Living Arrangements: Spouse/significant other Available Help at Discharge: Family;Available 24 hours/day Type of Home: House Home Access: Stairs to enter [employer] of Steps: 4 Entrance Stairs-Rails: Right;Left Home Layout: Multi-level;Able to live on main level with bedroom/bathroom Alternate Level Stairs-Number of Steps: 7 Alternate Level Stairs-Rails: Right Home Equipment: Cane -  single point Additional Comments: pt unsure whether or not he can get a RW, recommend sending one with him fit for his height Prior Function Level of Independence: Independent with assistive device(s) Comments: used cane Communication Communication: No difficulties    Cognition  Cognition Arousal/Alertness: Awake/alert  Behavior During Therapy: WFL for tasks assessed/performed Overall Cognitive Status: Within Functional Limits for tasks assessed    Extremity/Trunk Assessment Upper Extremity Assessment Upper Extremity Assessment: Overall WFL for tasks assessed Lower Extremity Assessment Lower Extremity Assessment: RLE deficits/detail;LLE deficits/detail RLE Deficits / Details: knee ext 2/5, hip flex 3/5. Knee flex 90 degrees AAROM LLE Deficits / Details: WFL Cervical / Trunk Assessment Cervical / Trunk Assessment: Normal   Balance Balance Overall balance assessment: No apparent balance deficits (not formally assessed)  End of Session PT - End of Session Activity Tolerance: Patient tolerated treatment well Patient left: in chair;with call bell/phone within reach Nurse Communication: Mobility status  GP   Leighton Roach, [place]  [place], [place] 06/03/2013, 10:41 AM

## 2013-06-03 NOTE — Evaluation (Signed)
Occupational Therapy Evaluation Patient Details Name: Jason [name] MRN: [id number] DOB: 1945-06-20 Today's Date: 06/03/2013 Time: 6967-8938 OT Time Calculation (min): 23 min  OT Assessment / Plan / Recommendation History of present illness 68 y.o. s/p  Rt Total Joint Replacement Partial/Uniknee   Clinical Impression   Pt moving well during session. Education provided. Feel pt is safe to d/c home, from OT standpoint, with wife available to assist.     OT Assessment  Patient does not need any further OT services    Follow Up Recommendations  No OT follow up;Supervision - Intermittent    Barriers to Discharge      Equipment Recommendations  3 in 1 bedside comode    Recommendations for Other Services    Frequency       Precautions / Restrictions Precautions Precautions: Knee Precaution Comments: Reviewed precautions with pt Restrictions Weight Bearing Restrictions: Yes RLE Weight Bearing: Weight bearing as tolerated   Pertinent Vitals/Pain Pain 3-4/10. Repositioned.     ADL  Grooming: Set up Where Assessed - Grooming: Supported sitting Lower Body Dressing: Min guard Where Assessed - Lower Body Dressing: Supported sit to Lobbyist: Magazine features editor Method: Sit to Loss adjuster, chartered: Raised toilet seat with arms (or 3-in-1 over toilet) Tub/Shower Transfer: Nurse, learning disability Method: Therapist, art: Walk in shower;Other (comment) (3 in 1) Equipment Used: Gait belt;Rolling walker Transfers/Ambulation Related to ADLs: Min guard ADL Comments: Educated on dressing technique and recommended sitting for dressing and to stand in front of chair/bed with walker in front. Discussed use of bag on walker to carry items. Recommended spouse pick up rugs in the house. Practiced shower transfer and discussed DME during session.    OT Diagnosis:    OT Problem List:   OT Treatment Interventions:     OT Goals(Current  goals can be found in the care plan section)    Visit Information  Last OT Received On: 06/03/13 Assistance Needed: +1 History of Present Illness: 68 y.o. s/p  Rt Total Joint Replacement Partial/Uniknee       Prior [place] expects to be discharged to:: Private residence Living Arrangements: Spouse/significant other Available Help at Discharge: Family;Available 24 hours/day Type of Home: House Home Access: Stairs to enter [employer] of Steps: 4 Entrance Stairs-Rails: Right;Left Home Layout: Multi-level;Able to live on main level with bedroom/bathroom Home Equipment: Kasandra Knudsen - single point;Other (comment) (thinks he has walker he can use) Prior Function Level of Independence: Independent with assistive device(s) Comments: used cane Communication Communication: No difficulties         Vision/Perception     Cognition  Cognition Arousal/Alertness: Awake/alert Behavior During Therapy: WFL for tasks assessed/performed Overall Cognitive Status: Within Functional Limits for tasks assessed    Extremity/Trunk Assessment Upper Extremity Assessment Upper Extremity Assessment: Overall WFL for tasks assessed Lower Extremity Assessment Lower Extremity Assessment: Defer to PT evaluation     Mobility Transfers Overall transfer level: Needs assistance Equipment used: Rolling walker (2 wheeled) Transfers: Sit to/from Stand Sit to Stand: Min guard General transfer comment: Cues for technique.     Exercise     Balance     End of Session OT - End of Session Equipment Utilized During Treatment: Gait belt;Rolling walker Activity Tolerance: Patient tolerated treatment well Patient left: in chair;with call bell/phone within reach  [place], [place] L OTR/L [phone] 06/03/2013, 9:38 AM

## 2013-06-03 NOTE — Progress Notes (Signed)
D/C instructions and scripts given. Pt verbalized understanding of instructions and wife at bedside to take Pt home.

## 2013-06-03 NOTE — Anesthesia Postprocedure Evaluation (Signed)
  Anesthesia Post-op Note  Patient: Jason [name]  Procedure(s) Performed: Procedure(s): RIGHT UNICOMPARTMENTAL KNEE (Right)  Patient Location: Nursing Unit  Anesthesia Type:General  Level of Consciousness: awake, alert  and oriented  Airway and Oxygen Therapy: Patient Spontanous Breathing  Post-op Pain: moderate  Post-op Assessment: Post-op Vital signs reviewed  Post-op Vital Signs: Reviewed and stable  Complications: No apparent anesthesia complications

## 2013-06-03 NOTE — Discharge Summary (Signed)
Physician Discharge Summary  Patient ID: Jason [name] MRN: [id number] DOB/AGE: 1946/03/04 68 y.o.  Admit date: 06/02/2013 Discharge date: 06/03/2013  Admission Diagnoses:  Osteoarthritis of right knee  Discharge Diagnoses:  Principal Problem:   Osteoarthritis of right knee Active Problems:   Knee osteoarthritis   Past Medical History  Diagnosis Date  . Allergic rhinitis, cause unspecified   . Restless legs   . Insomnia   . Low blood pressure reading     about 1 year ago  . GERD (gastroesophageal reflux disease)   . Hypertension   . Sleep apnea     "haven't worn my mask for 3-4 months" (05/2013)  . Migraines     "for years; aged out of them I guess; haven't had them in years" (06/02/2013)  . Arthritis     "knees, ankles" (06/02/2013)  . Osteoarthritis of right knee 06/02/2013    Surgeries: Procedure(s): RIGHT UNICOMPARTMENTAL KNEE on 06/02/2013   Consultants (if any):    Discharged Condition: Improved  Hospital Course: Jason [name] is an 68 y.o. male who was admitted 06/02/2013 with a diagnosis of Osteoarthritis of right knee and went to the operating room on 06/02/2013 and underwent the above named procedures.    He was given perioperative antibiotics:  Anti-infectives   Start     Dose/Rate Route Frequency Ordered Stop   06/02/13 1700  ceFAZolin (ANCEF) IVPB 2 g/50 mL premix     2 g 100 mL/hr over 30 Minutes Intravenous Every 6 hours 06/02/13 1500 06/02/13 2230   06/02/13 0600  clindamycin (CLEOCIN) IVPB 900 mg     900 mg 100 mL/hr over 30 Minutes Intravenous On call to O.R. 06/01/13 1254 06/02/13 1021    .  He was given sequential compression devices, early ambulation, and lovenox for DVT prophylaxis.  He benefited maximally from the hospital stay and there were no complications.    Recent vital signs:  Filed Vitals:   06/03/13 0601  BP: 145/90  Pulse: 70  Temp: 98.2 F (36.8 C)  Resp: 18    Recent laboratory studies:  Lab Results  Component Value Date   HGB 13.4 06/03/2013   HGB 14.6 06/02/2013   HGB 15.1 06/02/2013   Lab Results  Component Value Date   WBC 15.1* 06/03/2013   PLT 236 06/03/2013   No results found for this basename: INR   Lab Results  Component Value Date   NA 137 06/03/2013   K 4.6 06/03/2013   CL 99 06/03/2013   CO2 23 06/03/2013   BUN 20 06/03/2013   CREATININE 0.72 06/03/2013   GLUCOSE 150* 06/03/2013    Discharge Medications:     Medication List    STOP taking these medications       diclofenac 75 MG EC tablet  Commonly known as:  VOLTAREN      TAKE these medications       aspirin EC 81 MG tablet  Take 81 mg by mouth daily.     carbidopa-levodopa 25-100 MG per tablet  Commonly known as:  SINEMET IR  Take 1 tablet by mouth 2 (two) times daily as needed.     DOCQLACE 100 MG capsule  Generic drug:  docusate sodium  Take 100 mg by mouth at bedtime.     gabapentin 300 MG capsule  Commonly known as:  NEURONTIN  Take 300 mg by mouth at bedtime.     methocarbamol 500 MG tablet  Commonly known as:  ROBAXIN  Take 1 tablet (  500 mg total) by mouth 4 (four) times daily.     metoprolol tartrate 25 MG tablet  Commonly known as:  LOPRESSOR  Take 25 mg by mouth.     multivitamin tablet  Take 1 tablet by mouth daily.     omeprazole 20 MG capsule  Commonly known as:  PRILOSEC  Take 20 mg by mouth 2 (two) times daily.     oxyCODONE-acetaminophen 10-325 MG per tablet  Commonly known as:  PERCOCET  Take 1-2 tablets by mouth every 6 (six) hours as needed for pain. MAXIMUM TOTAL ACETAMINOPHEN DOSE IS 4000 MG PER DAY     promethazine 25 MG tablet  Commonly known as:  PHENERGAN  Take 1 tablet (25 mg total) by mouth every 6 (six) hours as needed for nausea or vomiting.     rOPINIRole 2 MG tablet  Commonly known as:  REQUIP  Take 2 mg by mouth 3 (three) times daily.     sennosides-docusate sodium 8.6-50 MG tablet  Commonly known as:  SENOKOT-S  Take 2 tablets by mouth daily.     zolpidem 10 MG tablet  Commonly known  as:  AMBIEN  Take 10 mg by mouth at bedtime as needed for sleep.        Diagnostic Studies: Dg Chest 2 View  06/02/2013   CLINICAL DATA:  Hypertension  EXAM: CHEST  2 VIEW  COMPARISON:  None.  FINDINGS: Lungs are clear. Heart size and pulmonary vascularity are normal. No adenopathy. There is degenerative change in the thoracic spine.  IMPRESSION: No edema or consolidation.   Electronically Signed   By: Lowella Grip M.D.   On: 06/02/2013 09:24   Dg Knee Right Port  06/02/2013   CLINICAL DATA:  Knee arthroplasty  EXAM: PORTABLE RIGHT KNEE - 1-2 VIEW  COMPARISON:  Knee MRI 05/15/2013  FINDINGS: Knee arthroplasty in the medial compartment. Prosthesis in satisfactory position and alignment. Lateral joint compartment is satisfactory. There is blood and gas in the knee joint. No fracture or immediate complication.  IMPRESSION: Satisfactory right knee medial arthroplasty.   Electronically Signed   By: Franchot Gallo M.D.   On: 06/02/2013 16:20    Disposition: Final discharge disposition not confirmed      Discharge Orders   Future Orders Complete By Expires   Call MD / Call 911  As directed    Comments:     If you experience chest pain or shortness of breath, CALL 911 and be transported to the hospital emergency room.  If you develope a fever above 101 F, pus (white drainage) or increased drainage or redness at the wound, or calf pain, call your surgeon's office.   Change dressing  As directed    Comments:     Change dressing in three days, then change the dressing daily with sterile 4 x 4 inch gauze dressing.  You may clean the incision with alcohol prior to redressing.   Constipation Prevention  As directed    Comments:     Drink plenty of fluids.  Prune juice may be helpful.  You may use a stool softener, such as Colace (over the counter) 100 mg twice a day.  Use MiraLax (over the counter) for constipation as needed.   Diet general  As directed    Discharge instructions  As directed     Comments:     Change dressing in 3 days and reapply fresh dressing, unless you have a splint (half cast).  If you have a  splint/cast, just leave in place until your follow-up appointment.    Keep wounds dry for 3 weeks.  Leave steri-strips in place on skin.  Do not apply lotion or anything to the wound.   Do not put a pillow under the knee. Place it under the heel.  As directed    TED hose  As directed    Comments:     Use stockings (TED hose) for 2 weeks on both leg(s).  You may remove them at night for sleeping.   Weight bearing as tolerated  As directed    Questions:     Laterality:     Extremity:        Follow-up Information   Follow up with Johnny Bridge, MD. Schedule an appointment as soon as possible for a visit in 2 weeks.   Specialty:  Orthopedic Surgery   Contact information:   [place] [phone]        Signed: Johnny Bridge 06/03/2013, 8:15 AM

## 2013-06-05 ENCOUNTER — Encounter (HOSPITAL_COMMUNITY): Payer: Self-pay | Admitting: Orthopedic Surgery

## 2014-01-15 ENCOUNTER — Other Ambulatory Visit: Payer: Self-pay

## 2014-01-21 ENCOUNTER — Ambulatory Visit: Payer: Medicare PPO | Admitting: Podiatry

## 2014-01-28 ENCOUNTER — Ambulatory Visit (INDEPENDENT_AMBULATORY_CARE_PROVIDER_SITE_OTHER): Payer: Medicare PPO | Admitting: Podiatry

## 2014-01-28 ENCOUNTER — Encounter: Payer: Self-pay | Admitting: Podiatry

## 2014-01-28 ENCOUNTER — Ambulatory Visit (INDEPENDENT_AMBULATORY_CARE_PROVIDER_SITE_OTHER): Payer: Medicare PPO

## 2014-01-28 VITALS — BP 169/91 | HR 74 | Resp 14 | Ht 70.0 in | Wt 235.0 lb

## 2014-01-28 DIAGNOSIS — M79673 Pain in unspecified foot: Secondary | ICD-10-CM

## 2014-01-28 DIAGNOSIS — M722 Plantar fascial fibromatosis: Secondary | ICD-10-CM

## 2014-01-28 MED ORDER — METHYLPREDNISOLONE (PAK) 4 MG PO TABS: ORAL_TABLET | ORAL | Status: DC

## 2014-01-28 NOTE — Progress Notes (Signed)
   Subjective:    Patient ID: SAVIR BLANKE, male    DOB: 1945/09/12, 68 y.o.   MRN: [id number]  HPI Comments: Pt states he has sharp pain in both forefeet and toes, with or without weight bearing activity.  Pt states this has been going on for 2-2.5 years on and off for varying degrees.  Foot Pain      Review of Systems  All other systems reviewed and are negative.      Objective:   Physical Exam: I have reviewed his past medical history medications allergies surgery social history and review of systems. Pulses are strongly palpable bilateral. Neurologic sensorium is intact percent once the monofilament. Deep tendon reflexes intact bilateral muscle strength is 5 over 5 dorsiflexion flexors and inverters and everters on his musculature is intact. Orthopedic evaluation and straight all joints distal to the ankle for range of motion without crepitation. Cutaneous evaluation demonstrates supple well-hydrated cutis. He has pain on palpation medial calcaneal tubercles bilateral heels. Radiographic evaluation doesn't straight plantar distally oriented calcaneal heel spurs with soft tissue increase in density at the plantar fascial calcaneal insertion site bilateral.        Assessment & Plan:  Assessment: Plantar fasciitis bilateral.  Plan: Injected the bilateral heels today with Kenalog and local anesthetic. Discussed etiology pathology conservative versus surgical therapies.  We also started him on a Medrol Dosepak once again. I will follow-up with him in 1 month at which time we will ascertain whether or not his forefoot is still bothering him. If these symptoms are subsiding then we will continue on current therapies however if they're still painful then we will consider orthotics.

## 2014-02-18 ENCOUNTER — Encounter: Payer: Self-pay | Admitting: Podiatry

## 2014-02-18 ENCOUNTER — Ambulatory Visit (INDEPENDENT_AMBULATORY_CARE_PROVIDER_SITE_OTHER): Payer: Medicare PPO | Admitting: Podiatry

## 2014-02-18 VITALS — BP 140/95 | HR 78 | Resp 14

## 2014-02-18 DIAGNOSIS — M722 Plantar fascial fibromatosis: Secondary | ICD-10-CM

## 2014-02-18 NOTE — Progress Notes (Signed)
He presents today for follow-up of bilateral plantar fasciitis. He states that he felt relief for approximately 3 days after his last set of injections. He states then now is just as bad as it was previously.  Objective: Vital signs are stable he is alert and oriented 3 large cavus foot deformity bilateral. Plantar fasciitis bilateral.  Assessment: Plantar fasciitis with pes cavus bilateral.  Plan: He was scanned today for set of orthotics.

## 2014-03-19 ENCOUNTER — Ambulatory Visit: Payer: Medicare PPO | Admitting: Podiatry

## 2014-03-19 DIAGNOSIS — M722 Plantar fascial fibromatosis: Secondary | ICD-10-CM

## 2014-03-19 NOTE — Patient Instructions (Signed)

## 2014-03-19 NOTE — Progress Notes (Signed)
Pt is here to [facility]O 

## 2014-09-27 ENCOUNTER — Other Ambulatory Visit: Payer: Self-pay

## 2014-12-01 ENCOUNTER — Other Ambulatory Visit: Payer: Self-pay | Admitting: Orthopedic Surgery

## 2014-12-02 DIAGNOSIS — M1710 Unilateral primary osteoarthritis, unspecified knee: Secondary | ICD-10-CM

## 2014-12-02 HISTORY — DX: Unilateral primary osteoarthritis, unspecified knee: M17.10

## 2014-12-03 ENCOUNTER — Encounter (HOSPITAL_BASED_OUTPATIENT_CLINIC_OR_DEPARTMENT_OTHER): Payer: Self-pay | Admitting: *Deleted

## 2014-12-03 NOTE — Pre-Procedure Instructions (Signed)
To come for BMET, CBC, PCR screen and EKG

## 2014-12-09 ENCOUNTER — Other Ambulatory Visit: Payer: Self-pay

## 2014-12-09 ENCOUNTER — Encounter (HOSPITAL_BASED_OUTPATIENT_CLINIC_OR_DEPARTMENT_OTHER)
Admission: RE | Admit: 2014-12-09 | Discharge: 2014-12-09 | Disposition: A | Discharge status: Home / Self Care | Payer: Medicare PPO | Source: Ambulatory Visit | Attending: Orthopedic Surgery | Admitting: Orthopedic Surgery

## 2014-12-09 DIAGNOSIS — I10 Essential (primary) hypertension: Secondary | ICD-10-CM | POA: Diagnosis not present

## 2014-12-09 DIAGNOSIS — G2581 Restless legs syndrome: Secondary | ICD-10-CM | POA: Diagnosis not present

## 2014-12-09 DIAGNOSIS — M170 Bilateral primary osteoarthritis of knee: Secondary | ICD-10-CM | POA: Insufficient documentation

## 2014-12-09 DIAGNOSIS — Z87891 Personal history of nicotine dependence: Secondary | ICD-10-CM | POA: Diagnosis not present

## 2014-12-09 DIAGNOSIS — K219 Gastro-esophageal reflux disease without esophagitis: Secondary | ICD-10-CM | POA: Diagnosis not present

## 2014-12-09 DIAGNOSIS — Z79899 Other long term (current) drug therapy: Secondary | ICD-10-CM | POA: Diagnosis not present

## 2014-12-09 DIAGNOSIS — M1712 Unilateral primary osteoarthritis, left knee: Secondary | ICD-10-CM | POA: Diagnosis not present

## 2014-12-09 DIAGNOSIS — Z6834 Body mass index (BMI) 34.0-34.9, adult: Secondary | ICD-10-CM | POA: Diagnosis not present

## 2014-12-09 DIAGNOSIS — Z791 Long term (current) use of non-steroidal anti-inflammatories (NSAID): Secondary | ICD-10-CM | POA: Diagnosis not present

## 2014-12-09 DIAGNOSIS — Z7982 Long term (current) use of aspirin: Secondary | ICD-10-CM | POA: Diagnosis not present

## 2014-12-09 DIAGNOSIS — G4730 Sleep apnea, unspecified: Secondary | ICD-10-CM | POA: Diagnosis not present

## 2014-12-09 LAB — CBC
HEMATOCRIT: 46.2 % (ref 39.0–52.0)
HEMOGLOBIN: 15.7 g/dL (ref 13.0–17.0)
MCH: 31.3 pg (ref 26.0–34.0)
MCHC: 34 g/dL (ref 30.0–36.0)
MCV: 92.2 fL (ref 78.0–100.0)
Platelets: 236 10*3/uL (ref 150–400)
RBC: 5.01 MIL/uL (ref 4.22–5.81)
RDW: 13.6 % (ref 11.5–15.5)
WBC: 9 10*3/uL (ref 4.0–10.5)

## 2014-12-09 LAB — BASIC METABOLIC PANEL
ANION GAP: 9 (ref 5–15)
BUN: 20 mg/dL (ref 6–20)
CALCIUM: 9.4 mg/dL (ref 8.9–10.3)
CO2: 23 mmol/L (ref 22–32)
Chloride: 104 mmol/L (ref 101–111)
Creatinine, Ser: 0.74 mg/dL (ref 0.61–1.24)
Glucose, Bld: 132 mg/dL — ABNORMAL HIGH (ref 65–99)
POTASSIUM: 4 mmol/L (ref 3.5–5.1)
Sodium: 136 mmol/L (ref 135–145)

## 2014-12-09 LAB — SURGICAL PCR SCREEN
MRSA, PCR: POSITIVE — AB
Staphylococcus aureus: POSITIVE — AB

## 2014-12-10 NOTE — Progress Notes (Signed)
Spoke with Sheri with Dr Denton Ar office, for change of time due to pt needing to receive vanacamycin prior to surgery. She will inform pt of change in time.

## 2014-12-17 ENCOUNTER — Ambulatory Visit (HOSPITAL_BASED_OUTPATIENT_CLINIC_OR_DEPARTMENT_OTHER): Payer: Medicare PPO | Admitting: Certified Registered"

## 2014-12-17 ENCOUNTER — Encounter (HOSPITAL_BASED_OUTPATIENT_CLINIC_OR_DEPARTMENT_OTHER): Payer: Self-pay

## 2014-12-17 ENCOUNTER — Ambulatory Visit (HOSPITAL_COMMUNITY): Payer: Medicare PPO

## 2014-12-17 ENCOUNTER — Encounter (HOSPITAL_BASED_OUTPATIENT_CLINIC_OR_DEPARTMENT_OTHER)
Admission: RE | Disposition: A | Discharge status: Home / Self Care | Payer: Self-pay | Source: Ambulatory Visit | Attending: Orthopedic Surgery

## 2014-12-17 ENCOUNTER — Ambulatory Visit (HOSPITAL_BASED_OUTPATIENT_CLINIC_OR_DEPARTMENT_OTHER)
Admission: RE | Admit: 2014-12-17 | Discharge: 2014-12-18 | Disposition: A | Discharge status: Home / Self Care | Payer: Medicare PPO | Source: Ambulatory Visit | Attending: Orthopedic Surgery | Admitting: Orthopedic Surgery

## 2014-12-17 DIAGNOSIS — I10 Essential (primary) hypertension: Secondary | ICD-10-CM | POA: Diagnosis not present

## 2014-12-17 DIAGNOSIS — Z7982 Long term (current) use of aspirin: Secondary | ICD-10-CM | POA: Insufficient documentation

## 2014-12-17 DIAGNOSIS — Z79899 Other long term (current) drug therapy: Secondary | ICD-10-CM | POA: Insufficient documentation

## 2014-12-17 DIAGNOSIS — Z791 Long term (current) use of non-steroidal anti-inflammatories (NSAID): Secondary | ICD-10-CM | POA: Insufficient documentation

## 2014-12-17 DIAGNOSIS — G2581 Restless legs syndrome: Secondary | ICD-10-CM | POA: Insufficient documentation

## 2014-12-17 DIAGNOSIS — Z96652 Presence of left artificial knee joint: Secondary | ICD-10-CM

## 2014-12-17 DIAGNOSIS — Z87891 Personal history of nicotine dependence: Secondary | ICD-10-CM | POA: Insufficient documentation

## 2014-12-17 DIAGNOSIS — M1712 Unilateral primary osteoarthritis, left knee: Secondary | ICD-10-CM | POA: Insufficient documentation

## 2014-12-17 DIAGNOSIS — K219 Gastro-esophageal reflux disease without esophagitis: Secondary | ICD-10-CM | POA: Diagnosis not present

## 2014-12-17 DIAGNOSIS — Z6834 Body mass index (BMI) 34.0-34.9, adult: Secondary | ICD-10-CM | POA: Insufficient documentation

## 2014-12-17 DIAGNOSIS — G4730 Sleep apnea, unspecified: Secondary | ICD-10-CM | POA: Insufficient documentation

## 2014-12-17 HISTORY — DX: Restless legs syndrome: G25.81

## 2014-12-17 HISTORY — DX: Unilateral primary osteoarthritis, left knee: M17.12

## 2014-12-17 HISTORY — DX: Personal history of other diseases of the nervous system and sense organs: Z86.69

## 2014-12-17 HISTORY — PX: PARTIAL KNEE ARTHROPLASTY: SHX2174

## 2014-12-17 HISTORY — DX: Unilateral primary osteoarthritis, unspecified knee: M17.10

## 2014-12-17 SURGERY — ARTHROPLASTY, KNEE, UNICOMPARTMENTAL
Anesthesia: Regional | Site: Knee | Laterality: Left

## 2014-12-17 MED ORDER — DEXAMETHASONE SODIUM PHOSPHATE 10 MG/ML IJ SOLN
INTRAMUSCULAR | Status: AC
  Filled 2014-12-17: qty 1

## 2014-12-17 MED ORDER — PANTOPRAZOLE SODIUM 40 MG PO TBEC
80.0000 mg | DELAYED_RELEASE_TABLET | Freq: Every day | ORAL | Status: DC
  Administered 2014-12-17: 80 mg via ORAL

## 2014-12-17 MED ORDER — OXYCODONE HCL 5 MG PO TABS: 5.0000 mg | ORAL_TABLET | ORAL | Status: DC | PRN

## 2014-12-17 MED ORDER — VANCOMYCIN HCL IN DEXTROSE 1-5 GM/200ML-% IV SOLN
1000.0000 mg | Freq: Two times a day (BID) | INTRAVENOUS | Status: AC
  Administered 2014-12-17: 1000 mg via INTRAVENOUS

## 2014-12-17 MED ORDER — METHOCARBAMOL 500 MG PO TABS
500.0000 mg | ORAL_TABLET | Freq: Four times a day (QID) | ORAL | Status: DC | PRN
  Administered 2014-12-17 – 2014-12-18 (×2): 500 mg via ORAL
  Filled 2014-12-17 (×2): qty 1

## 2014-12-17 MED ORDER — CEFAZOLIN SODIUM-DEXTROSE 2-3 GM-% IV SOLR
2.0000 g | INTRAVENOUS | Status: AC
  Administered 2014-12-17: 2 g via INTRAVENOUS

## 2014-12-17 MED ORDER — METOPROLOL TARTRATE 25 MG PO TABS
25.0000 mg | ORAL_TABLET | Freq: Two times a day (BID) | ORAL | Status: DC
  Administered 2014-12-17: 25 mg via ORAL

## 2014-12-17 MED ORDER — FENTANYL CITRATE (PF) 100 MCG/2ML IJ SOLN
INTRAMUSCULAR | Status: AC
  Filled 2014-12-17: qty 4

## 2014-12-17 MED ORDER — DOCUSATE SODIUM 100 MG PO CAPS: 100.0000 mg | ORAL_CAPSULE | Freq: Two times a day (BID) | ORAL | Status: DC

## 2014-12-17 MED ORDER — VANCOMYCIN HCL IN DEXTROSE 1-5 GM/200ML-% IV SOLN
INTRAVENOUS | Status: AC
  Filled 2014-12-17: qty 200

## 2014-12-17 MED ORDER — CEFAZOLIN SODIUM-DEXTROSE 2-3 GM-% IV SOLR
2.0000 g | Freq: Three times a day (TID) | INTRAVENOUS | Status: AC
  Administered 2014-12-17 – 2014-12-18 (×2): 2 g via INTRAVENOUS
  Filled 2014-12-17 (×3): qty 50

## 2014-12-17 MED ORDER — ONE-DAILY MULTI VITAMINS PO TABS: 1.0000 | ORAL_TABLET | Freq: Every day | ORAL | Status: DC

## 2014-12-17 MED ORDER — POLYETHYLENE GLYCOL 3350 17 G PO PACK: 17.0000 g | PACK | Freq: Every day | ORAL | Status: DC | PRN

## 2014-12-17 MED ORDER — SENNA 8.6 MG PO TABS: 1.0000 | ORAL_TABLET | Freq: Two times a day (BID) | ORAL | Status: DC

## 2014-12-17 MED ORDER — ONDANSETRON HCL 4 MG/2ML IJ SOLN
INTRAMUSCULAR | Status: DC | PRN
  Administered 2014-12-17: 4 mg via INTRAVENOUS

## 2014-12-17 MED ORDER — ONDANSETRON HCL 4 MG/2ML IJ SOLN
INTRAMUSCULAR | Status: AC
  Filled 2014-12-17: qty 2

## 2014-12-17 MED ORDER — PHENYLEPHRINE HCL 10 MG/ML IJ SOLN
INTRAMUSCULAR | Status: DC | PRN
  Administered 2014-12-17: 40 ug via INTRAVENOUS

## 2014-12-17 MED ORDER — RIVAROXABAN 10 MG PO TABS: 10.0000 mg | ORAL_TABLET | Freq: Every day | ORAL | Status: DC

## 2014-12-17 MED ORDER — DEXAMETHASONE SODIUM PHOSPHATE 10 MG/ML IJ SOLN
INTRAMUSCULAR | Status: DC | PRN
  Administered 2014-12-17: 10 mg via INTRAVENOUS

## 2014-12-17 MED ORDER — VANCOMYCIN HCL IN DEXTROSE 500-5 MG/100ML-% IV SOLN
INTRAVENOUS | Status: AC
  Filled 2014-12-17: qty 100

## 2014-12-17 MED ORDER — ONDANSETRON HCL 4 MG PO TABS: 4.0000 mg | ORAL_TABLET | Freq: Three times a day (TID) | ORAL | Status: DC | PRN

## 2014-12-17 MED ORDER — FENTANYL CITRATE (PF) 100 MCG/2ML IJ SOLN
50.0000 ug | INTRAMUSCULAR | Status: AC | PRN
  Administered 2014-12-17: 25 ug via INTRAVENOUS
  Administered 2014-12-17: 50 ug via INTRAVENOUS
  Administered 2014-12-17: 100 ug via INTRAVENOUS
  Administered 2014-12-17: 25 ug via INTRAVENOUS

## 2014-12-17 MED ORDER — HYDROMORPHONE HCL 1 MG/ML IJ SOLN
0.5000 mg | INTRAMUSCULAR | Status: DC | PRN
  Administered 2014-12-17: 1 mg via INTRAVENOUS
  Filled 2014-12-17: qty 1

## 2014-12-17 MED ORDER — LIDOCAINE HCL (CARDIAC) 20 MG/ML IV SOLN
INTRAVENOUS | Status: AC
  Filled 2014-12-17: qty 5

## 2014-12-17 MED ORDER — SENNA-DOCUSATE SODIUM 8.6-50 MG PO TABS: 2.0000 | ORAL_TABLET | Freq: Every day | ORAL | Status: DC

## 2014-12-17 MED ORDER — BISACODYL 10 MG RE SUPP: 10.0000 mg | Freq: Every day | RECTAL | Status: DC | PRN

## 2014-12-17 MED ORDER — CLONAZEPAM 1 MG PO TABS
1.0000 mg | ORAL_TABLET | Freq: Every day | ORAL | Status: DC
  Administered 2014-12-17: 1 mg via ORAL

## 2014-12-17 MED ORDER — VITAMIN D3 25 MCG (1000 UNIT) PO TABS: 1000.0000 [IU] | ORAL_TABLET | Freq: Every day | ORAL | Status: DC

## 2014-12-17 MED ORDER — ONDANSETRON HCL 4 MG PO TABS: 4.0000 mg | ORAL_TABLET | Freq: Four times a day (QID) | ORAL | Status: DC | PRN

## 2014-12-17 MED ORDER — METOCLOPRAMIDE HCL 5 MG/ML IJ SOLN: 5.0000 mg | Freq: Three times a day (TID) | INTRAMUSCULAR | Status: DC | PRN

## 2014-12-17 MED ORDER — SODIUM CHLORIDE 0.9 % IV SOLN
INTRAVENOUS | Status: DC | PRN
  Administered 2014-12-17: 1000 mL
  Administered 2014-12-17: 75 mL/h via INTRAVENOUS

## 2014-12-17 MED ORDER — MIDAZOLAM HCL 2 MG/2ML IJ SOLN
INTRAMUSCULAR | Status: AC
  Filled 2014-12-17: qty 2

## 2014-12-17 MED ORDER — ZOLPIDEM TARTRATE 5 MG PO TABS: 5.0000 mg | ORAL_TABLET | Freq: Every evening | ORAL | Status: DC | PRN

## 2014-12-17 MED ORDER — GLYCOPYRROLATE 0.2 MG/ML IJ SOLN: 0.2000 mg | Freq: Once | INTRAMUSCULAR | Status: DC | PRN

## 2014-12-17 MED ORDER — LACTATED RINGERS IV SOLN
INTRAVENOUS | Status: DC
  Administered 2014-12-17 (×2): via INTRAVENOUS

## 2014-12-17 MED ORDER — ONDANSETRON HCL 4 MG/2ML IJ SOLN
4.0000 mg | Freq: Four times a day (QID) | INTRAMUSCULAR | Status: DC | PRN
  Administered 2014-12-17: 4 mg via INTRAVENOUS
  Filled 2014-12-17: qty 2

## 2014-12-17 MED ORDER — HYDROMORPHONE HCL 1 MG/ML IJ SOLN
INTRAMUSCULAR | Status: AC
  Filled 2014-12-17: qty 1

## 2014-12-17 MED ORDER — SCOPOLAMINE 1 MG/3DAYS TD PT72: 1.0000 | MEDICATED_PATCH | Freq: Once | TRANSDERMAL | Status: DC | PRN

## 2014-12-17 MED ORDER — ROPINIROLE HCL 1 MG PO TABS
2.0000 mg | ORAL_TABLET | Freq: Three times a day (TID) | ORAL | Status: DC
  Administered 2014-12-17 (×2): 2 mg via ORAL

## 2014-12-17 MED ORDER — PROPOFOL 10 MG/ML IV BOLUS
INTRAVENOUS | Status: DC | PRN
  Administered 2014-12-17: 150 mg via INTRAVENOUS

## 2014-12-17 MED ORDER — PHENYLEPHRINE 40 MCG/ML (10ML) SYRINGE FOR IV PUSH (FOR BLOOD PRESSURE SUPPORT)
PREFILLED_SYRINGE | INTRAVENOUS | Status: AC
  Filled 2014-12-17: qty 10

## 2014-12-17 MED ORDER — SODIUM CHLORIDE 0.9 % IV SOLN
500.0000 mg | Freq: Once | INTRAVENOUS | Status: AC
  Administered 2014-12-17: 500 mg via INTRAVENOUS

## 2014-12-17 MED ORDER — METHOCARBAMOL 1000 MG/10ML IJ SOLN: 500.0000 mg | Freq: Four times a day (QID) | INTRAVENOUS | Status: DC | PRN

## 2014-12-17 MED ORDER — OXYCODONE-ACETAMINOPHEN 10-325 MG PO TABS: 1.0000 | ORAL_TABLET | Freq: Four times a day (QID) | ORAL | Status: DC | PRN

## 2014-12-17 MED ORDER — OXYCODONE-ACETAMINOPHEN 5-325 MG PO TABS
1.0000 | ORAL_TABLET | ORAL | Status: DC | PRN
  Administered 2014-12-17 – 2014-12-18 (×5): 2 via ORAL
  Filled 2014-12-17 (×5): qty 2

## 2014-12-17 MED ORDER — HYDROMORPHONE HCL 1 MG/ML IJ SOLN
0.2500 mg | INTRAMUSCULAR | Status: DC | PRN
  Administered 2014-12-17 (×4): 0.5 mg via INTRAVENOUS

## 2014-12-17 MED ORDER — BUPIVACAINE-EPINEPHRINE (PF) 0.5% -1:200000 IJ SOLN
INTRAMUSCULAR | Status: DC | PRN
  Administered 2014-12-17: 30 mL via PERINEURAL

## 2014-12-17 MED ORDER — BACLOFEN 10 MG PO TABS: 10.0000 mg | ORAL_TABLET | Freq: Three times a day (TID) | ORAL | Status: DC

## 2014-12-17 MED ORDER — SODIUM CHLORIDE 0.9 % IV SOLN: INTRAVENOUS | Status: DC

## 2014-12-17 MED ORDER — ASPIRIN EC 81 MG PO TBEC
81.0000 mg | DELAYED_RELEASE_TABLET | Freq: Every day | ORAL | Status: DC
  Administered 2014-12-17: 81 mg via ORAL

## 2014-12-17 MED ORDER — MAGNESIUM CITRATE PO SOLN: 1.0000 | Freq: Once | ORAL | Status: DC | PRN

## 2014-12-17 MED ORDER — VANCOMYCIN HCL IN DEXTROSE 1-5 GM/200ML-% IV SOLN
1000.0000 mg | Freq: Once | INTRAVENOUS | Status: AC
  Administered 2014-12-17: 1000 mg via INTRAVENOUS

## 2014-12-17 MED ORDER — METOCLOPRAMIDE HCL 5 MG PO TABS: 5.0000 mg | ORAL_TABLET | Freq: Three times a day (TID) | ORAL | Status: DC | PRN

## 2014-12-17 MED ORDER — MIDAZOLAM HCL 2 MG/2ML IJ SOLN
1.0000 mg | INTRAMUSCULAR | Status: DC | PRN
  Administered 2014-12-17: 1 mg via INTRAVENOUS

## 2014-12-17 MED ORDER — FENTANYL CITRATE (PF) 100 MCG/2ML IJ SOLN
INTRAMUSCULAR | Status: AC
  Filled 2014-12-17: qty 2

## 2014-12-17 MED ORDER — GABAPENTIN 300 MG PO CAPS
300.0000 mg | ORAL_CAPSULE | Freq: Every day | ORAL | Status: DC
  Administered 2014-12-17: 300 mg via ORAL

## 2014-12-17 SURGICAL SUPPLY — 68 items
BANDAGE ELASTIC 6 VELCRO ST LF (GAUZE/BANDAGES/DRESSINGS) ×3 IMPLANT
BANDAGE ESMARK 6X9 LF (GAUZE/BANDAGES/DRESSINGS) ×1 IMPLANT
BLADE SURG 10 STRL SS (BLADE) ×3 IMPLANT
BLADE SURG 15 STRL LF DISP TIS (BLADE) ×2 IMPLANT
BLADE SURG 15 STRL SS (BLADE) ×6
BNDG CMPR 9X6 STRL LF SNTH (GAUZE/BANDAGES/DRESSINGS) ×1
BNDG ESMARK 6X9 LF (GAUZE/BANDAGES/DRESSINGS) ×3
BOWL SMART MIX CTS (DISPOSABLE) ×3 IMPLANT
CANISTER SUCT 1200ML W/VALVE (MISCELLANEOUS) IMPLANT
CANISTER SUCTION 2500CC (MISCELLANEOUS) ×1 IMPLANT
CAPT KNEE PARTIAL 2 ×2 IMPLANT
CEMENT HV SMART SET (Cement) ×3 IMPLANT
CLOSURE STERI-STRIP 1/2X4 (GAUZE/BANDAGES/DRESSINGS) ×1
CLSR STERI-STRIP ANTIMIC 1/2X4 (GAUZE/BANDAGES/DRESSINGS) ×2 IMPLANT
COVER BACK TABLE 60X90IN (DRAPES) ×3 IMPLANT
CUFF TOURNIQUET SINGLE 34IN LL (TOURNIQUET CUFF) ×2 IMPLANT
DECANTER SPIKE VIAL GLASS SM (MISCELLANEOUS) IMPLANT
DRAPE EXTREMITY T 121X128X90 (DRAPE) ×3 IMPLANT
DRAPE U 20/CS (DRAPES) ×3 IMPLANT
DRAPE U-SHAPE 47X51 STRL (DRAPES) ×3 IMPLANT
DRSG PAD ABDOMINAL 8X10 ST (GAUZE/BANDAGES/DRESSINGS) ×3 IMPLANT
DURAPREP 26ML APPLICATOR (WOUND CARE) ×3 IMPLANT
ELECT REM PT RETURN 9FT ADLT (ELECTROSURGICAL) ×3
ELECTRODE REM PT RTRN 9FT ADLT (ELECTROSURGICAL) ×1 IMPLANT
FACESHIELD WRAPAROUND (MASK) ×6 IMPLANT
FACESHIELD WRAPAROUND OR TEAM (MASK) ×2 IMPLANT
GAUZE SPONGE 4X4 12PLY STRL (GAUZE/BANDAGES/DRESSINGS) ×3 IMPLANT
GLOVE BIO SURGEON STRL SZ 6.5 (GLOVE) ×2 IMPLANT
GLOVE BIO SURGEON STRL SZ8 (GLOVE) ×3 IMPLANT
GLOVE BIO SURGEONS STRL SZ 6.5 (GLOVE) ×2
GLOVE BIOGEL PI IND STRL 7.0 (GLOVE) IMPLANT
GLOVE BIOGEL PI IND STRL 8 (GLOVE) ×2 IMPLANT
GLOVE BIOGEL PI INDICATOR 7.0 (GLOVE) ×6
GLOVE BIOGEL PI INDICATOR 8 (GLOVE) ×4
GLOVE ORTHO TXT STRL SZ7.5 (GLOVE) ×3 IMPLANT
GOWN STRL REUS W/ TWL LRG LVL3 (GOWN DISPOSABLE) ×1 IMPLANT
GOWN STRL REUS W/ TWL XL LVL3 (GOWN DISPOSABLE) ×2 IMPLANT
GOWN STRL REUS W/TWL LRG LVL3 (GOWN DISPOSABLE) ×6
GOWN STRL REUS W/TWL XL LVL3 (GOWN DISPOSABLE) ×6
HANDPIECE INTERPULSE COAX TIP (DISPOSABLE) ×3
IMMOBILIZER KNEE 22 UNIV (SOFTGOODS) ×1 IMPLANT
IMMOBILIZER KNEE 24 THIGH 36 (MISCELLANEOUS) ×1 IMPLANT
IMMOBILIZER KNEE 24 UNIV (MISCELLANEOUS) ×3
KNEE WRAP E Z 3 GEL PACK (MISCELLANEOUS) ×2 IMPLANT
MANIFOLD NEPTUNE II (INSTRUMENTS) ×2 IMPLANT
NS IRRIG 1000ML POUR BTL (IV SOLUTION) ×3 IMPLANT
PACK ARTHROSCOPY DSU (CUSTOM PROCEDURE TRAY) ×3 IMPLANT
PACK BASIN DAY SURGERY FS (CUSTOM PROCEDURE TRAY) ×3 IMPLANT
PENCIL BUTTON HOLSTER BLD 10FT (ELECTRODE) ×3 IMPLANT
SAWBLADE OXFORD PARTIAL (BLADE) ×3 IMPLANT
SET HNDPC FAN SPRY TIP SCT (DISPOSABLE) ×1 IMPLANT
SHEET MEDIUM DRAPE 40X70 STRL (DRAPES) ×3 IMPLANT
SLEEVE SCD COMPRESS KNEE MED (MISCELLANEOUS) ×3 IMPLANT
SPONGE LAP 18X18 X RAY DECT (DISPOSABLE) ×3 IMPLANT
SUCTION FRAZIER TIP 10 FR DISP (SUCTIONS) ×3 IMPLANT
SUT MNCRL AB 4-0 PS2 18 (SUTURE) IMPLANT
SUT VIC AB 0 CT1 27 (SUTURE) ×3
SUT VIC AB 0 CT1 27XBRD ANBCTR (SUTURE) ×1 IMPLANT
SUT VIC AB 2-0 SH 27 (SUTURE) ×3
SUT VIC AB 2-0 SH 27XBRD (SUTURE) ×1 IMPLANT
SUT VICRYL 3-0 CR8 SH (SUTURE) ×3 IMPLANT
SUT VICRYL 4-0 PS2 18IN ABS (SUTURE) IMPLANT
SYR BULB IRRIGATION 50ML (SYRINGE) ×3 IMPLANT
TOWEL OR 17X24 6PK STRL BLUE (TOWEL DISPOSABLE) ×3 IMPLANT
TOWEL OR NON WOVEN STRL DISP B (DISPOSABLE) ×6 IMPLANT
TUBE CONNECTING 20'X1/4 (TUBING) ×1
TUBE CONNECTING 20X1/4 (TUBING) ×1 IMPLANT
YANKAUER SUCT BULB TIP NO VENT (SUCTIONS) IMPLANT

## 2014-12-17 NOTE — Anesthesia Preprocedure Evaluation (Addendum)
Anesthesia Evaluation  Patient identified by MRN, date of birth, ID band Patient awake    Reviewed: Allergy & Precautions, H&P , NPO status , Patient's Chart, lab work & pertinent test results, reviewed documented beta blocker date and time   Airway Mallampati: III  TM Distance: >3 FB Neck ROM: Full    Dental no notable dental hx. (+) Teeth Intact, Dental Advisory Given   Pulmonary sleep apnea , former smoker,    Pulmonary exam normal breath sounds clear to auscultation       Cardiovascular hypertension, Pt. on medications and Pt. on home beta blockers  Rhythm:Regular Rate:Normal     Neuro/Psych negative neurological ROS  negative psych ROS   GI/Hepatic Neg liver ROS, GERD  Medicated and Controlled,  Endo/Other  Morbid obesity  Renal/GU negative Renal ROS  negative genitourinary   Musculoskeletal  (+) Arthritis , Osteoarthritis,    Abdominal   Peds  Hematology negative hematology ROS (+)   Anesthesia Other Findings   Reproductive/Obstetrics negative OB ROS                            Anesthesia Physical Anesthesia Plan  ASA: III  Anesthesia Plan: General and Regional   Post-op Pain Management: GA combined w/ Regional for post-op pain   Induction: Intravenous  Airway Management Planned: LMA  Additional Equipment:   Intra-op Plan:   Post-operative Plan: Extubation in OR  Informed Consent: I have reviewed the patients History and Physical, chart, labs and discussed the procedure including the risks, benefits and alternatives for the proposed anesthesia with the patient or authorized representative who has indicated his/her understanding and acceptance.   Dental advisory given  Plan Discussed with: CRNA  Anesthesia Plan Comments:         Anesthesia Quick Evaluation

## 2014-12-17 NOTE — H&P (Signed)
PREOPERATIVE H&P  Chief Complaint: UNILATERAL PRIMARY OSTEOARTHRITIS LEFT KNEE   HPI: Jason [name] is a 69 y.o. male who presents for preoperative history and physical with a diagnosis of Left anteromedial osteoarthritis. Symptoms are rated as moderate to severe, and have been worsening.  This is significantly impairing activities of daily living.  He has elected for surgical management.   He has failed injections, activity modification, anti-inflammatories, and assistive devices.He has had a previous right unicompartmental knee replacement and is very happy with the results.  Preoperative X-rays demonstrate end stage degenerative changes with osteophyte formation, loss of joint space, subchondral sclerosis.   Past Medical History  Diagnosis Date  . Insomnia   . GERD (gastroesophageal reflux disease)   . History of migraine     resolved  . Restless leg syndrome   . Hypertension     states under control with med., has been on med. x 5 yr.  . Unilateral osteoarthritis of knee 12/2014    left  . Sleep apnea     no CPAP use   Past Surgical History  Procedure Laterality Date  . Tonsillectomy  1950's  . Partial knee arthroplasty Right 06/02/2013    Procedure: RIGHT UNICOMPARTMENTAL KNEE;  Surgeon: Johnny Bridge, MD;  Location: [facility];  Service: Orthopedics;  Laterality: Right;   Social History   Social History  . Marital Status: Married    Spouse Name: N/A  . Number of Children: 2  . Years of Education: N/A   Social History Main Topics  . Smoking status: Former Research scientist (life sciences)  . Smokeless tobacco: Never Used     Comment: 06/02/2013   . Alcohol Use: Yes     Comment: occasionally  . Drug Use: No  . Sexual Activity: Yes   Other Topics Concern  . None   Social History Narrative   Retiring from [employer].     Family History  Problem Relation Age of Onset  . Emphysema Father     Died age 30  . Sudden death Mother     Died age 68  . CAD Brother     Died age 64  . Hypertension  Brother    Allergies  Allergen Reactions  . Augmentin [Amoxicillin-Pot Clavulanate] Nausea Only       . Penicillins Nausea Only   Prior to Admission medications   Medication Sig Start Date End Date Taking? Authorizing Provider  aspirin EC 81 MG tablet Take 81 mg by mouth daily.   Yes Historical Provider, MD  cholecalciferol (VITAMIN D) 1000 UNITS tablet Take 1,000 Units by mouth daily.   Yes Historical Provider, MD  clonazePAM (KLONOPIN) 1 MG tablet Take 1 mg by mouth at bedtime.   Yes Historical Provider, MD  gabapentin (NEURONTIN) 300 MG capsule Take 300 mg by mouth at bedtime.   Yes Historical Provider, MD  meloxicam (MOBIC) 15 MG tablet Take 15 mg by mouth daily.   Yes Historical Provider, MD  metoprolol tartrate (LOPRESSOR) 25 MG tablet Take 25 mg by mouth.   Yes Historical Provider, MD  Multiple Vitamin (MULTIVITAMIN) tablet Take 1 tablet by mouth daily.   Yes Historical Provider, MD  omeprazole (PRILOSEC) 20 MG capsule Take 20 mg by mouth daily.    Yes Historical Provider, MD  rOPINIRole (REQUIP) 2 MG tablet Take 2 mg by mouth 3 (three) times daily.    Yes Historical Provider, MD  vitamin E 100 UNIT capsule Take by mouth daily.   Yes Historical Provider, MD  carbidopa-levodopa (  SINEMET IR) 25-100 MG per tablet Take 1 tablet by mouth 2 (two) times daily as needed.     Historical Provider, MD     Positive ROS: All other systems have been reviewed and were otherwise negative with the exception of those mentioned in the HPI and as above.  Physical Exam: General: Alert, no acute distress Cardiovascular: No pedal edema Respiratory: No cyanosis, no use of accessory musculature GI: No organomegaly, abdomen is soft and non-tender Skin: No lesions in the area of chief complaint Neurologic: Sensation intact distally Psychiatric: Patient is competent for consent with normal mood and affect Lymphatic: No axillary or cervical lymphadenopathy  MUSCULOSKELETAL: Left knee has range of  motion 0-125 with pseudo-laxity. He has varus alignment that corrects to neutral. Positive medial crepitance. Positive medial joint line tenderness. Lachman is intact.  X-rays demonstrate end-stage degenerative changes with anteromedial osteoarthritis and bone-on-bone contact..  Assessment: Left anteromedial knee osteoarthritis, status post left partial knee replacement   Plan: Plan for Procedure(s): LEFT KNEE UNI ARTHROPLASTY   The risks benefits and alternatives were discussed with the patient including but not limited to the risks of nonoperative treatment, versus surgical intervention including infection, bleeding, nerve injury,  blood clots, cardiopulmonary complications, morbidity, mortality, among others, and they were willing to proceed.   He does have a history of positive MRSA screen, and we have treated accordingly with mupirocin, we'll give vancomycin preoperatively and postoperatively along with Ancef.  Johnny Bridge, MD Cell [phone]   12/17/2014 7:32 AM

## 2014-12-17 NOTE — Anesthesia Procedure Notes (Addendum)
Anesthesia Regional Block:  Femoral nerve block  Pre-Anesthetic Checklist: ,, timeout performed, Correct Patient, Correct Site, Correct Laterality, Correct Procedure, Correct Position, site marked, Risks and benefits discussed, pre-op evaluation,  At surgeon's request and post-op pain management  Laterality: Left  Prep: Maximum Sterile Barrier Precautions used and chloraprep       Needles:  Injection technique: Single-shot  Needle Type: Echogenic Stimulator Needle     Needle Length: 5cm 5 cm Needle Gauge: 22 and 22 G    Additional Needles:  Procedures: ultrasound guided (picture in chart) Femoral nerve block  Nerve Stimulator or Paresthesia:  Response: Patellar respose,   Additional Responses:   Narrative:  Start time: 12/17/2014 8:38 AM End time: 12/17/2014 8:48 AM Injection made incrementally with aspirations every 5 mL. Anesthesiologist: Roderic Palau  Additional Notes: 2% Lidocaine skin wheel.    Procedure Name: LMA Insertion Date/Time: 12/17/2014 10:50 AM Performed by: BLOCKER, TIMOTHY D Pre-anesthesia Checklist: Patient identified, Emergency Drugs available, Suction available and Patient being monitored Patient Re-evaluated:Patient Re-evaluated prior to inductionOxygen Delivery Method: Circle System Utilized Preoxygenation: Pre-oxygenation with 100% oxygen Intubation Type: IV induction Ventilation: Mask ventilation without difficulty LMA: LMA inserted LMA Size: 4.0 Number of attempts: 1 Airway Equipment and Method: Bite block Placement Confirmation: positive ETCO2 Tube secured with: Tape Dental Injury: Teeth and Oropharynx as per pre-operative assessment

## 2014-12-17 NOTE — Transfer of Care (Signed)
Immediate Anesthesia Transfer of Care Note  Patient: Jason [name]  Procedure(s) Performed: Procedure(s): LEFT KNEE UNI ARTHROPLASTY  (Left)  Patient Location: PACU  Anesthesia Type:GA combined with regional for post-op pain  Level of Consciousness: awake and patient cooperative  Airway & Oxygen Therapy: Patient Spontanous Breathing and Patient connected to face mask oxygen  Post-op Assessment: Report given to RN and Post -op Vital signs reviewed and stable  Post vital signs: Reviewed and stable  Last Vitals:  Filed Vitals:   12/17/14 1250  BP:   Pulse: 79  Temp:   Resp:     Complications: No apparent anesthesia complications

## 2014-12-17 NOTE — Op Note (Signed)
12/17/2014  12:17 PM  PATIENT:  Jason [name]    PRE-OPERATIVE DIAGNOSIS:  UNILATERAL PRIMARY OSTEOARTHRITIS LEFT KNEE   POST-OPERATIVE DIAGNOSIS:  Same  PROCEDURE:  LEFT KNEE UNI ARTHROPLASTY   SURGEON:  Johnny Bridge, MD  PHYSICIAN ASSISTANT: Joya Gaskins, OPA-C, present and scrubbed throughout the case, critical for completion in a timely fashion, and for retraction, instrumentation, and closure.  ANESTHESIA:   General  PREOPERATIVE INDICATIONS:  Jason [name] is a  69 y.o. male with a diagnosis of UNILATERAL PRIMARY OSTEOARTHRITIS LEFT KNEE  who failed conservative measures and elected for surgical management.    The risks benefits and alternatives were discussed with the patient preoperatively including but not limited to the risks of infection, bleeding, nerve injury, cardiopulmonary complications, blood clots, the need for revision surgery, among others, and the patient was willing to proceed.  OPERATIVE IMPLANTS: Biomet Oxford mobile bearing medial compartment arthroplasty femur size medium, tibia size D, bearing size 3.  OPERATIVE FINDINGS: Endstage grade 4 medial compartment osteoarthritis. No significant changes in the lateral or patellofemoral joint.  The ACL was intact.  OPERATIVE PROCEDURE: The patient was brought to the operating room placed in supine position. General anesthesia was administered. IV antibiotics were given. The lower extremity was placed in the legholder and prepped and draped in usual sterile fashion.  Time out was performed.  The leg was elevated and exsanguinated and the tourniquet was inflated. Anteromedial incision was performed, and I took care to preserve the MCL. Parapatellar incision was carried out, and the osteophytes were excised, along with the medial meniscus and a small portion of the fat pad.  The extra medullary tibial cutting jig was applied, using the spoon and the 45mm G-Clamp and the 2 mm shim, and I took care to protect the  anterior cruciate ligament insertion and the tibial spine. The medial collateral ligament was also protected, and I resected my proximal tibia, matching the anatomic slope.   The proximal tibial bony cut was removed in one piece, and I turned my attention to the femur.  The intramedullary femoral rod was placed using the drill, and then using the appropriate reference, I assembled the femoral jig, setting my posterior cutting block. I resected my posterior femur, used the 0 spigot for the anterior femur, and then measured my gap.   I then used the appropriate mill to match the extension gap to the flexion gap. The second milling was at a 3.  The gaps were then measured again with the appropriate feeler gauges. Once I had balanced flexion and extension gaps, I then completed the preparation of the femur.  I milled off the anterior aspect of the distal femur to prevent impingement. I also exposed the tibia, and selected the above-named component, and then used the cutting jig to prepare the keel slot on the tibia. I also used the awl to curette out the bone to complete the preparation of the keel. The back wall was intact.  I then placed trial components, and it was found to have excellent motion, and appropriate balance.  I then cemented the components into place, cementing the tibia first, removing all excess cement, and then cementing the femur.  All loose cement was removed.  The real polyethylene insert was applied manually, and the knee was taken through functional range of motion, and found to have excellent stability and restoration of joint motion, with excellent balance.  The wounds were irrigated copiously, and the parapatellar tissue closed with Vicryl, followed  by Vicryl for the subcutaneous tissue, with routine closure with Steri-Strips and sterile gauze.  The tourniquet was released, and the patient was awakened and extubated and returned to PACU in stable and satisfactory condition.  There were no complications.

## 2014-12-17 NOTE — Progress Notes (Signed)
Assisted Dr. Oren Bracket with left, ultrasound guided block. Side rails up, monitors on throughout procedure. See vital signs in flow sheet. Tolerated Procedure well.

## 2014-12-17 NOTE — Anesthesia Postprocedure Evaluation (Signed)
  Anesthesia Post-op Note  Patient: Jason [name]  Procedure(s) Performed: Procedure(s): LEFT KNEE UNI ARTHROPLASTY  (Left)  Patient Location: PACU  Anesthesia Type: General, Regional   Level of Consciousness: awake, alert  and oriented  Airway and Oxygen Therapy: Patient Spontanous Breathing  Post-op Pain: mild  Post-op Assessment: Post-op Vital signs reviewed  Post-op Vital Signs: Reviewed  Last Vitals:  Filed Vitals:   12/17/14 1345  BP: 147/92  Pulse: 73  Temp:   Resp: 14    Complications: No apparent anesthesia complications

## 2014-12-17 NOTE — Discharge Instructions (Signed)
Diet: As you were doing prior to hospitalization   Shower:  May shower but keep the wounds dry, use an occlusive plastic wrap, NO SOAKING IN TUB.  If the bandage gets wet, change with a clean dry gauze.  If you have a splint on, leave the splint in place and keep the splint dry with a plastic bag.  Dressing:  You may change your dressing 3-5 days after surgery, unless you have a splint.  If you have a splint, then just leave the splint in place and we will change your bandages during your first follow-up appointment.    If you had hand or foot surgery, we will plan to remove your stitches in about 2 weeks in the office.  For all other surgeries, there are sticky tapes (steri-strips) on your wounds and all the stitches are absorbable.  Leave the steri-strips in place when changing your dressings, they will peel off with time, usually 2-3 weeks.  Activity:  Increase activity slowly as tolerated, but follow the weight bearing instructions below.  The rules on driving is that you can not be taking narcotics while you drive, and you must feel in control of the vehicle.    Weight Bearing:   As tolerated.    To prevent constipation: you may use a stool softener such as -  Colace (over the counter) 100 mg by mouth twice a day  Drink plenty of fluids (prune juice may be helpful) and high fiber foods Miralax (over the counter) for constipation as needed.    Itching:  If you experience itching with your medications, try taking only a single pain pill, or even half a pain pill at a time.  You may take up to 10 pain pills per day, and you can also use benadryl over the counter for itching or also to help with sleep.   Precautions:  If you experience chest pain or shortness of breath - call 911 immediately for transfer to the hospital emergency department!!  If you develop a fever greater that 101 F, purulent drainage from wound, increased redness or drainage from wound, or calf pain -- Call the office at  [phone]                                                Follow- Up Appointment:  Please call for an appointment to be seen in 2 weeks [facility] - [phone]     Regional Anesthesia Blocks  1. Numbness or the inability to move the "blocked" extremity may last from 3-48 hours after placement. The length of time depends on the medication injected and your individual response to the medication. If the numbness is not going away after 48 hours, call your surgeon.  2. The extremity that is blocked will need to be protected until the numbness is gone and the  Strength has returned. Because you cannot feel it, you will need to take extra care to avoid injury. Because it may be weak, you may have difficulty moving it or using it. You may not know what position it is in without looking at it while the block is in effect.  3. For blocks in the legs and feet, returning to weight bearing and walking needs to be done carefully. You will need to wait until the numbness is entirely gone and the strength has returned. You should  be able to move your leg and foot normally before you try and bear weight or walk. You will need someone to be with you when you first try to ensure you do not fall and possibly risk injury.  4. Bruising and tenderness at the needle site are common side effects and will resolve in a few days.  5. Persistent numbness or new problems with movement should be communicated to the surgeon or the [facility] [phone] [place] [phone]).

## 2014-12-18 DIAGNOSIS — M1712 Unilateral primary osteoarthritis, left knee: Secondary | ICD-10-CM | POA: Diagnosis not present

## 2014-12-20 ENCOUNTER — Encounter (HOSPITAL_BASED_OUTPATIENT_CLINIC_OR_DEPARTMENT_OTHER): Payer: Self-pay | Admitting: Orthopedic Surgery

## 2015-04-27 ENCOUNTER — Encounter: Payer: Self-pay | Admitting: Neurology

## 2015-04-27 ENCOUNTER — Ambulatory Visit (INDEPENDENT_AMBULATORY_CARE_PROVIDER_SITE_OTHER): Payer: Medicare Other | Admitting: Neurology

## 2015-04-27 VITALS — BP 128/92 | HR 74 | Ht 70.0 in | Wt 248.0 lb

## 2015-04-27 DIAGNOSIS — E538 Deficiency of other specified B group vitamins: Secondary | ICD-10-CM

## 2015-04-27 DIAGNOSIS — R202 Paresthesia of skin: Secondary | ICD-10-CM | POA: Insufficient documentation

## 2015-04-27 DIAGNOSIS — G2581 Restless legs syndrome: Secondary | ICD-10-CM

## 2015-04-27 HISTORY — DX: Paresthesia of skin: R20.2

## 2015-04-27 HISTORY — DX: Restless legs syndrome: G25.81

## 2015-04-27 MED ORDER — GABAPENTIN 300 MG PO CAPS: ORAL_CAPSULE | ORAL | Status: DC

## 2015-04-27 NOTE — Patient Instructions (Signed)

## 2015-04-27 NOTE — Progress Notes (Signed)
Reason for visit: Foot discomfort  Referring physician: Dr. Emily Filbert Jason [name] is a 70 y.o. male  History of present illness:  Jason [name] is a 71 year old right-handed white male with a history of degenerative arthritis of the knees, status post bilateral knee replacements. The patient has noted a two-year history of gradually progressive problems with numbness, tingling sensations and burning sensations involving the feet. The patient has discomfort on the bottom and top of the feet. Initially, he was felt to have plantar fasciitis, but treatment for this did not seem to elicit any benefit. The patient reports that he has discomfort that is better in the morning, worse as the day goes on, and seems to increase the longer he is on his feet. The pain however also bothers him at night, and keeps him awake. The patient also has restless leg syndrome for which she takes Requip and occasionally Sinemet. The patient has been placed on gabapentin taking 300 mg in the morning and 600 mg in the evening which has resulted in some benefit with the discomfort in the feet, but the patient has noted some mild gait instability on the medication. He denies any numbness or discomfort in the hands on either side. He denies any issues with back pain, neck pain, or discomfort down the legs. He denies any issues controlling the bowels or the bladder. He is sent to this office for evaluation of a possible underlying peripheral neuropathy.  Past Medical History  Diagnosis Date  . Insomnia   . GERD (gastroesophageal reflux disease)   . History of migraine     resolved  . Restless leg syndrome   . Hypertension     states under control with med., has been on med. x 5 yr.  . Unilateral osteoarthritis of knee 12/2014    left  . Sleep apnea     no CPAP use  . Primary localized osteoarthritis of left knee 12/17/2014  . Paresthesia 04/27/2015  . RLS (restless legs syndrome) 04/27/2015    Past Surgical History    Procedure Laterality Date  . Tonsillectomy  1950's  . Partial knee arthroplasty Right 06/02/2013    Procedure: RIGHT UNICOMPARTMENTAL KNEE;  Surgeon: Johnny Bridge, MD;  Location: [facility];  Service: Orthopedics;  Laterality: Right;  . Partial knee arthroplasty Left 12/17/2014    Procedure: LEFT KNEE UNI ARTHROPLASTY ;  Surgeon: Marchia Bond, MD;  Location: [facility];  Service: Orthopedics;  Laterality: Left;    Family History  Problem Relation Age of Onset  . Emphysema Father     Died age 71  . COPD Father   . Sudden death Mother     Died age 8  . CAD Brother     Died age 68  . Hypertension Brother     Social history:  reports that he has quit smoking. He has never used smokeless tobacco. He reports that he drinks alcohol. He reports that he does not use illicit drugs.  Medications:  Prior to Admission medications   Medication Sig Start Date End Date Taking? Authorizing Provider  baclofen (LIORESAL) 10 MG tablet Take 1 tablet (10 mg total) by mouth 3 (three) times daily. As needed for muscle spasm 12/17/14  Yes Marchia Bond, MD  carbidopa-levodopa (SINEMET IR) 25-100 MG per tablet Take 1 tablet by mouth 2 (two) times daily as needed.    Yes Historical Provider, MD  cholecalciferol (VITAMIN D) 1000 UNITS tablet Take 1,000 Units by mouth daily.  Yes Historical Provider, MD  clonazePAM (KLONOPIN) 1 MG tablet Take 1 mg by mouth at bedtime.   Yes Historical Provider, MD  gabapentin (NEURONTIN) 300 MG capsule Take 300 mg by mouth at bedtime.   Yes Historical Provider, MD  meloxicam (MOBIC) 15 MG tablet Take 15 mg by mouth daily.   Yes Historical Provider, MD  metoprolol tartrate (LOPRESSOR) 25 MG tablet Take 25 mg by mouth.   Yes Historical Provider, MD  Multiple Vitamin (MULTIVITAMIN) tablet Take 1 tablet by mouth daily.   Yes Historical Provider, MD  omeprazole (PRILOSEC) 20 MG capsule Take 20 mg by mouth daily.    Yes Historical Provider, MD  rOPINIRole (REQUIP) 2 MG  tablet Take 2 mg by mouth 3 (three) times daily.    Yes Historical Provider, MD  vitamin E 100 UNIT capsule Take by mouth daily.   Yes Historical Provider, MD      Allergies  Allergen Reactions  . Augmentin [Amoxicillin-Pot Clavulanate] Nausea Only       . Penicillins Nausea Only    ROS:  Out of a complete 14 system review of symptoms, the patient complains only of the following symptoms, and all other reviewed systems are negative.  Joint pain Restless legs  Blood pressure 128/92, pulse 74, height 5\' 10"  (1.778 m), weight 248 lb (112.492 kg).  Physical Exam  General: The patient is alert and cooperative at the time of the examination. The patient is moderately obese.  Eyes: Pupils are equal, round, and reactive to light. Discs are flat bilaterally.  Neck: The neck is supple, no carotid bruits are noted.  Respiratory: The respiratory examination is clear.  Cardiovascular: The cardiovascular examination reveals a regular rate and rhythm, no obvious murmurs or rubs are noted.  Skin: Extremities are with trace edema at the ankles bilaterally.  Neurologic Exam  Mental status: The patient is alert and oriented x 3 at the time of the examination. The patient has apparent normal recent and remote memory, with an apparently normal attention span and concentration ability.  Cranial nerves: Facial symmetry is present. There is good sensation of the face to pinprick and soft touch bilaterally. The strength of the facial muscles and the muscles to head turning and shoulder shrug are normal bilaterally. Speech is well enunciated, no aphasia or dysarthria is noted. Extraocular movements are full. Visual fields are full. The tongue is midline, and the patient has symmetric elevation of the soft palate. No obvious hearing deficits are noted.  Motor: The motor testing reveals 5 over 5 strength of all 4 extremities. Good symmetric motor tone is noted throughout.  Sensory: Sensory testing is  intact to pinprick, soft touch, vibration sensation, and position sense on all 4 extremities. There is a pinprick stocking pattern sensory deficit across the ankles bilaterally. No evidence of extinction is noted.  Coordination: Cerebellar testing reveals good finger-nose-finger and heel-to-shin bilaterally.  Gait and station: Gait is normal. Tandem gait is normal. Romberg is negative. No drift is seen. The patient is able to walk on heels and the toes bilaterally.  Reflexes: Deep tendon reflexes are symmetric and normal bilaterally. Ankle jerk reflexes are present, slightly decreased bilaterally. Toes are downgoing bilaterally.   Assessment/Plan:  1. Foot discomfort, possible peripheral neuropathy  2. Restless leg syndrome  The patient may have an early peripheral neuropathy. The ankle jerk reflexes are still maintained, the patient could have a small fiber neuropathy. The patient will be sent for blood work today, he will be set up  for nerve conduction studies of both legs, and EMG of one leg. We may consider addition of other medications such as Cymbalta in the future, the gabapentin dosing can also be increased. The patient will follow-up for the above study.  Jill Alexanders MD 04/27/2015 10:58 AM  [facility] [address] [place], [place] [address]  Phone [phone] Fax [phone]

## 2015-04-29 LAB — MULTIPLE MYELOMA PANEL, SERUM
ALBUMIN/GLOB SERPL: 0.9 (ref 0.7–1.7)
ALPHA2 GLOB SERPL ELPH-MCNC: 1 g/dL (ref 0.4–1.0)
Albumin SerPl Elph-Mcnc: 3.3 g/dL (ref 2.9–4.4)
Alpha 1: 0.3 g/dL (ref 0.0–0.4)
B-GLOBULIN SERPL ELPH-MCNC: 1.3 g/dL (ref 0.7–1.3)
GLOBULIN, TOTAL: 3.7 g/dL (ref 2.2–3.9)
Gamma Glob SerPl Elph-Mcnc: 1.1 g/dL (ref 0.4–1.8)
IGG (IMMUNOGLOBIN G), SERUM: 1076 mg/dL (ref 700–1600)
IgA/Immunoglobulin A, Serum: 237 mg/dL (ref 61–437)
IgM (Immunoglobulin M), Srm: 48 mg/dL (ref 20–172)
TOTAL PROTEIN: 7 g/dL (ref 6.0–8.5)

## 2015-04-29 LAB — VITAMIN B12: VITAMIN B 12: 478 pg/mL (ref 211–946)

## 2015-04-29 LAB — IRON AND TIBC
Iron Saturation: 16 % (ref 15–55)
Iron: 63 ug/dL (ref 38–169)
Total Iron Binding Capacity: 394 ug/dL (ref 250–450)
UIBC: 331 ug/dL (ref 111–343)

## 2015-04-29 LAB — RHEUMATOID FACTOR

## 2015-04-29 LAB — ANA W/REFLEX: ANA: NEGATIVE

## 2015-04-29 LAB — COPPER, SERUM: COPPER: 100 ug/dL (ref 72–166)

## 2015-04-29 LAB — ANGIOTENSIN CONVERTING ENZYME: ANGIO CONVERT ENZYME: 50 U/L (ref 14–82)

## 2015-04-29 LAB — FERRITIN: Ferritin: 70 ng/mL (ref 30–400)

## 2015-05-05 ENCOUNTER — Ambulatory Visit (INDEPENDENT_AMBULATORY_CARE_PROVIDER_SITE_OTHER): Payer: Self-pay | Admitting: Neurology

## 2015-05-05 ENCOUNTER — Telehealth: Payer: Self-pay | Admitting: Neurology

## 2015-05-05 ENCOUNTER — Encounter: Payer: Self-pay | Admitting: Neurology

## 2015-05-05 ENCOUNTER — Ambulatory Visit (INDEPENDENT_AMBULATORY_CARE_PROVIDER_SITE_OTHER): Payer: Medicare Other | Admitting: Neurology

## 2015-05-05 DIAGNOSIS — G2581 Restless legs syndrome: Secondary | ICD-10-CM

## 2015-05-05 DIAGNOSIS — R202 Paresthesia of skin: Secondary | ICD-10-CM | POA: Diagnosis not present

## 2015-05-05 NOTE — Procedures (Signed)
     HISTORY:  Mr. Matthiew Moura is a 70 year old gentleman with a 2 year history of discomfort involving the lower extremities, worse in the evenings. He reports lancinating pains in the legs that may keep him awake at night. He is being evaluated for possible peripheral neuropathy. He denies any low back pain.  NERVE CONDUCTION STUDIES:  Nerve conduction studies were performed on both lower extremities. The distal motor latencies and motor amplitudes for the peroneal and posterior tibial nerves were within normal limits. The nerve conduction velocities for these nerves were also normal. The H reflex latencies were normal. The sensory latencies for the peroneal nerves were within normal limits.   EMG STUDIES:  EMG study was performed on the right lower extremity:  The tibialis anterior muscle reveals 2 to 4K motor units with full recruitment. No fibrillations or positive waves were seen. The peroneus tertius muscle reveals 2 to 4K motor units with full recruitment. No fibrillations or positive waves were seen. The medial gastrocnemius muscle reveals 1 to 3K motor units with full recruitment. No fibrillations or positive waves were seen. The vastus lateralis muscle reveals 2 to 4K motor units with full recruitment. No fibrillations or positive waves were seen. The iliopsoas muscle reveals 2 to 4K motor units with full recruitment. No fibrillations or positive waves were seen. The biceps femoris muscle (long head) reveals 2 to 4K motor units with full recruitment. No fibrillations or positive waves were seen. The lumbosacral paraspinal muscles were tested at 3 levels, and revealed no abnormalities of insertional activity at all 3 levels tested, with exception of one run of positive waves in the middle level tested. There was good relaxation.   IMPRESSION:  Nerve conduction studies done on both lower extremities were within normal limits. No clear evidence of a peripheral neuropathy is seen.  A small fiber neuropathy may be missed by standard nerve conduction studies, however. Clinical correlation is required. EMG evaluation of the right lower extremity was unremarkable, without clear evidence of an overlying lumbosacral radiculopathy.  Jill Alexanders MD 05/05/2015 10:30 AM  [facility] [address] [place], [place] [address]  Phone [phone] Fax [phone]

## 2015-05-05 NOTE — Telephone Encounter (Signed)
I called patient. EMG and nerve conduction study was unremarkable. He may have a small fiber neuropathy. He is to contact our office if medication is required to control the pain.

## 2015-05-05 NOTE — Progress Notes (Signed)
Please refer to EMG and nerve conduction study procedure note. 

## 2015-05-05 NOTE — Progress Notes (Signed)
Jason [name] is a 70 year old gentleman who comes in today for EMG and nerve conduction study evaluation. EMG of the right leg was unremarkable. Nerve conduction studies do not show definite peripheral neuropathy, a small fiber neuropathy may be missed.  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 He is on gabapentin, taking 300 mg 3 times daily. He has gained some modest benefit with this medication. He continues to have difficulty with pain in the evenings, sharp lancinating pains may occur at night. He denies any low back pain.  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 We may add Cymbalta if the pain is not well-controlled. We may consider EMG evaluation in the future to fully exclude lumbosacral spinal stenosis as an etiology. Skin biopsy in the future can be done to confirm the diagnosis.

## 2016-07-19 ENCOUNTER — Encounter: Payer: Medicare Other | Attending: Nurse Practitioner | Admitting: Nutrition

## 2016-07-19 VITALS — Ht 70.0 in | Wt 249.0 lb

## 2016-07-19 DIAGNOSIS — Z713 Dietary counseling and surveillance: Secondary | ICD-10-CM | POA: Insufficient documentation

## 2016-07-19 DIAGNOSIS — IMO0002 Reserved for concepts with insufficient information to code with codable children: Secondary | ICD-10-CM

## 2016-07-19 DIAGNOSIS — E1165 Type 2 diabetes mellitus with hyperglycemia: Secondary | ICD-10-CM

## 2016-07-19 DIAGNOSIS — E119 Type 2 diabetes mellitus without complications: Secondary | ICD-10-CM | POA: Insufficient documentation

## 2016-07-19 DIAGNOSIS — E118 Type 2 diabetes mellitus with unspecified complications: Secondary | ICD-10-CM

## 2016-07-19 DIAGNOSIS — Z6835 Body mass index (BMI) 35.0-35.9, adult: Secondary | ICD-10-CM

## 2016-07-19 NOTE — Progress Notes (Signed)
Diabetes Self-Management Education  Visit Type: First/Initial  Appt. Start Time: 800 Appt. End Time: 930  07/19/2016  [name], identified by name and date of birth, is a 71 y.o. male with a diagnosis of Diabetes: Type 2. LIves with is wife. Just diagnosed with Type 2 DM in Dec 2017. 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 Has a lot of problems with knees and feet of neuropathy that limits his activity. Motivated to make changes with diet and weight to improve his blood sugars. A1C 6.8%.   ASSESSMENT  Height 5\' 10"  (1.778 m), weight 249 lb (112.9 kg). Body mass index is 35.73 kg/m.      Diabetes Self-Management Education - 07/19/16 0818      Visit Information   Visit Type First/Initial     Initial Visit   Diabetes Type Type 2   Are you currently following a meal plan? No   Are you taking your medications as prescribed? Yes   Date Diagnosed Dec 2018     Health Coping   How would you rate your overall health? Good     Psychosocial Assessment   Patient Belief/Attitude about Diabetes Motivated to manage diabetes   Self-care barriers None   Self-management support Family   Other persons present Patient   Patient Concerns Nutrition/Meal planning;Medication;Monitoring;Healthy Lifestyle;Problem Solving;Weight Control   Special Needs None   Preferred Learning Style No preference indicated   Learning Readiness Ready   How often do you need to have someone help you when you read instructions, pamphlets, or other written materials from your doctor or pharmacy? 1 - Never   What is the last grade level you completed in school? 12     Pre-Education Assessment   Patient understands the diabetes disease and treatment process. Needs Instruction   Patient understands incorporating nutritional management into lifestyle. Needs Instruction   Patient undertands incorporating physical activity into lifestyle. Needs Instruction   Patient understands using medications  safely. Needs Instruction   Patient understands monitoring blood glucose, interpreting and using results Needs Instruction   Patient understands prevention, detection, and treatment of acute complications. Needs Instruction   Patient understands prevention, detection, and treatment of chronic complications. Needs Instruction   Patient understands how to develop strategies to address psychosocial issues. Needs Instruction   Patient understands how to develop strategies to promote health/change behavior. Needs Instruction     Complications   Last HgB A1C per patient/outside source 6.8 %   How often do you check your blood sugar? 1-2 times/day   Fasting Blood glucose range (mg/dL) 130-179   Postprandial Blood glucose range (mg/dL) 130-179   Number of hypoglycemic episodes per month 0   Number of hyperglycemic episodes per week 1   Can you tell when your blood sugar is high? Yes   What do you do if your blood sugar is high? drink water   Have you had a dilated eye exam in the past 12 months? Yes   Have you had a dental exam in the past 12 months? Yes   Are you checking your feet? Yes   How many days per week are you checking your feet? 7     Dietary Intake   Breakfast Eggs and toast or bacon and sausage, milk or coffee or frosted flakes with skim milk   Snack (morning) none   Lunch skips or sandwich-chicken salad or ham on white bread,   Dinner Frozen TV: chicken fetticinie, Pepsi, or water   Snack (  evening) nutrigrain bar, Glucerna, something sweet.   Beverage(s) water, soda, milk, GLucerna     Exercise   Exercise Type ADL's;Light (walking / raking leaves)   How many days per week to you exercise? 2   How many minutes per day do you exercise? 15   Total minutes per week of exercise 30     Patient Education   Disease state  Definition of diabetes, type 1 and 2, and the diagnosis of diabetes;Factors that contribute to the development of diabetes;Explored patient's options for treatment  of their diabetes   Nutrition management  Role of diet in the treatment of diabetes and the relationship between the three main macronutrients and blood glucose level;Food label reading, portion sizes and measuring food.;Carbohydrate counting;Meal timing in regards to the patients' current diabetes medication.;Reviewed blood glucose goals for pre and post meals and how to evaluate the patients' food intake on their blood glucose level.;Information on hints to eating out and maintain blood glucose control.;Meal options for control of blood glucose level and chronic complications.   Physical activity and exercise  Role of exercise on diabetes management, blood pressure control and cardiac health.;Identified with patient nutritional and/or medication changes necessary with exercise.;Helped patient identify appropriate exercises in relation to his/her diabetes, diabetes complications and other health issue.   Medications Taught/reviewed insulin injection, site rotation, insulin storage and needle disposal.;Reviewed patients medication for diabetes, action, purpose, timing of dose and side effects.;Reviewed medication adjustment guidelines for hyperglycemia and sick days.   Monitoring Taught/evaluated SMBG meter.;Purpose and frequency of SMBG.;Taught/discussed recording of test results and interpretation of SMBG.;Interpreting lab values - A1C, lipid, urine microalbumina.;Identified appropriate SMBG and/or A1C goals.;Daily foot exams   Acute complications Discussed and identified patients' treatment of hyperglycemia.   Chronic complications Assessed and discussed foot care and prevention of foot problems;Lipid levels, blood glucose control and heart disease;Identified and discussed with patient  current chronic complications;Retinopathy and reason for yearly dilated eye exams;Nephropathy, what it is, prevention of, the use of ACE, ARB's and early detection of through urine microalbumia.;Reviewed with patient heart  disease, higher risk of, and prevention;Relationship between chronic complications and blood glucose control   Psychosocial adjustment Worked with patient to identify barriers to care and solutions;Role of stress on diabetes;Identified and addressed patients feelings and concerns about diabetes;Helped patient identify a support system for diabetes management;Brainstormed with patient on coping mechanisms for social situations, getting support from significant others, dealing with feelings about diabetes   Personal strategies to promote health Lifestyle issues that need to be addressed for better diabetes care;Helped patient develop diabetes management plan for (enter comment)     Individualized Goals (developed by patient)   Nutrition Follow meal plan discussed;General guidelines for healthy choices and portions discussed;Adjust meds/carbs with exercise as discussed   Physical Activity Exercise 3-5 times per week;30 minutes per day   Medications take my medication as prescribed   Monitoring  test my blood glucose as discussed;send in my blood glucose log as discussed;test blood glucose pre and post meals as discussed   Reducing Risk examine blood glucose patterns;get labs drawn;do foot checks daily;increase portions of olive oil in diet   Health Coping ask for help with (comment)  diet and meal planning as needed     Post-Education Assessment   Patient understands the diabetes disease and treatment process. Needs Review   Patient understands incorporating nutritional management into lifestyle. Needs Review   Patient undertands incorporating physical activity into lifestyle. Needs Review   Patient understands using medications  safely. Needs Review   Patient understands monitoring blood glucose, interpreting and using results Needs Review   Patient understands prevention, detection, and treatment of acute complications. Needs Review   Patient understands prevention, detection, and treatment of  chronic complications. Needs Review   Patient understands how to develop strategies to address psychosocial issues. Needs Review   Patient understands how to develop strategies to promote health/change behavior. Needs Review     Outcomes   Expected Outcomes Demonstrated limited interest in learning.  Expect minimal changes;Demonstrated interest in learning. Expect positive outcomes   Future DMSE 4-6 wks   Program Status Completed     Calorie needs for weight loss 1600 Kcals 180 g Carbs 120 g protein 44 g of fat  Individualized Plan for Diabetes Self-Management Training:   Learning Objective:  Patient will have a greater understanding of diabetes self-management. Patient education plan is to attend individual and/or group sessions per assessed needs and concerns.   Plan:   Patient Instructions  Goals 1. Follow My Plate 2. Cut out sweets, sodas and desserts 3. Drink more water 64 oz per day 4. Increase fresh fruits and vegetables. 5. Exercise as tolerated Lose 1 lb per week. Goals of 5% weight loss in 6 months.      Expected Outcomes:  Demonstrated limited interest in learning.  Expect minimal changes, Demonstrated interest in learning. Expect positive outcomes  Education material provided: Living Well with Diabetes, Food label handouts, A1C conversion sheet, Meal plan card, My Plate and Carbohydrate counting sheet  If problems or questions, patient to contact team via:  Phone and Email  Future DSME appointment: 4-6 wks

## 2016-07-19 NOTE — Patient Instructions (Addendum)
Goals 1. Follow My Plate 2. Cut out sweets, sodas and desserts 3. Drink more water 64 oz per day 4. Increase fresh fruits and vegetables. 5. Exercise as tolerated Lose 1 lb per week. Goals of 5% weight loss in 6 months.

## 2016-08-22 ENCOUNTER — Encounter: Payer: Medicare Other | Attending: Nurse Practitioner | Admitting: Nutrition

## 2016-08-22 VITALS — Ht 71.0 in | Wt 245.0 lb

## 2016-08-22 DIAGNOSIS — Z713 Dietary counseling and surveillance: Secondary | ICD-10-CM | POA: Insufficient documentation

## 2016-08-22 DIAGNOSIS — E119 Type 2 diabetes mellitus without complications: Secondary | ICD-10-CM | POA: Insufficient documentation

## 2016-08-22 DIAGNOSIS — E669 Obesity, unspecified: Secondary | ICD-10-CM

## 2016-08-22 NOTE — Patient Instructions (Addendum)
Goals 1. Don't skip meals 2. Eat 30-45 grams of carbs per meal 3. Increase more lower carb vegetables 4. Keep drinking lots of water 5. Cut out Pepsi. 6. Cut out bacon and sausage A1C down to 6% or less Lose 10% of weight over 6 months.

## 2016-08-22 NOTE — Progress Notes (Signed)
Diabetes Self-Management Education  Visit Type:  Follow-up  Appt. Start Time: 1000 Appt. End Time: 1030 08/22/2016  [name], identified by name and date of birth, is a 71 y.o. male with a diagnosis of Diabetes: Type 2.  He notes he is much better. BS are in the 100-170's usually.. Made lots of changes with his diet. He is cutting out snacks, watching portions, eating more healthier foods. He does admit to skipping meals more than he should. Lost 4 lbs. Still having some trouble with his legs and walking a lot.  Needs more consistency with diet and exercise. Drinking mostly water but does drink a Pepsi every now and then.     CMP Latest Ref Rng & Units 04/27/2015 12/09/2014 06/03/2013  Glucose 65 - 99 mg/dL - 132(H) 150(H)  BUN 6 - 20 mg/dL - 20 20  Creatinine 0.61 - 1.24 mg/dL - 0.74 0.72  Sodium 135 - 145 mmol/L - 136 137  Potassium 3.5 - 5.1 mmol/L - 4.0 4.6  Chloride 101 - 111 mmol/L - 104 99  CO2 22 - 32 mmol/L - 23 23  Calcium 8.9 - 10.3 mg/dL - 9.4 9.0  Total Protein 6.0 - 8.5 g/dL 7.0 - -    ASSESSMENT  Height 5\' 11"  (1.803 m), weight 245 lb (111.1 kg). Body mass index is 34.17 kg/m.       Diabetes Self-Management Education - 08/22/16 1024      Health Coping   How would you rate your overall health? Good     Psychosocial Assessment   Patient Concerns Nutrition/Meal planning;Weight Control   Special Needs None     Complications   Fasting Blood glucose range (mg/dL) 130-179   Postprandial Blood glucose range (mg/dL) 130-179   Number of hypoglycemic episodes per month 0   Number of hyperglycemic episodes per week 0   Can you tell when your blood sugar is high? Yes   Have you had a dilated eye exam in the past 12 months? No   Have you had a dental exam in the past 12 months? No   Are you checking your feet? Yes   How many days per week are you checking your feet? 7     Dietary Intake   Breakfast Frosted flakes or raisin bran cereal   Lunch skips sometimes:  Sandwich on white wheat, skim milk or water,      Learning Objective:  Patient will have a greater understanding of diabetes self-management. Patient education plan is to attend individual and/or group sessions per assessed needs and concerns.   Plan:   Patient Instructions  Goals 1. Don't skip meals 2. Eat 30-45 grams of carbs per meal 3. Increase more lower carb vegetables 4. Keep drinking lots of water 5. Cut out Pepsi. 6. Cut out bacon and sausage A1C down to 6% or less     Expected Outcomes:    Improved diabetes knowledge, reduced complications and better BS control and weight loss.  Education material provided: Meal plan card, My Plate and Carbohydrate counting sheet  If problems or questions, patient to contact team via:  Phone and Email  Future DSME appointment: -   3 months

## 2016-11-20 ENCOUNTER — Ambulatory Visit (INDEPENDENT_AMBULATORY_CARE_PROVIDER_SITE_OTHER): Payer: Medicare Other | Admitting: Neurology

## 2016-11-20 ENCOUNTER — Encounter: Payer: Self-pay | Admitting: Neurology

## 2016-11-20 VITALS — BP 123/82 | HR 74 | Ht 71.0 in | Wt 235.8 lb

## 2016-11-20 DIAGNOSIS — M79605 Pain in left leg: Secondary | ICD-10-CM

## 2016-11-20 DIAGNOSIS — G2581 Restless legs syndrome: Secondary | ICD-10-CM

## 2016-11-20 DIAGNOSIS — M79604 Pain in right leg: Secondary | ICD-10-CM | POA: Diagnosis not present

## 2016-11-20 DIAGNOSIS — R202 Paresthesia of skin: Secondary | ICD-10-CM | POA: Diagnosis not present

## 2016-11-20 MED ORDER — DULOXETINE HCL 30 MG PO CPEP: 30.0000 mg | ORAL_CAPSULE | Freq: Two times a day (BID) | ORAL | 3 refills | Status: DC

## 2016-11-20 NOTE — Patient Instructions (Signed)
    With the Cymbalta 30 mg, begin one a day for 2 weeks, then take one twice a day.  We will get MRI of the low back.  Cymbalta (duloxetine) is an antidepressant medication that is commonly used for peripheral neuropathy pain or for fibromyalgia pain. As with any antidepressant medication, worsening depression can be seen. This medication can potentially cause headache, dizziness, sexual dysfunction, or nausea. If any problems are noted on this medication, please contact our office.

## 2016-11-20 NOTE — Progress Notes (Signed)
Reason for visit: Leg pain  Jason [name] is an 71 y.o. 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 The patient does have a history of borderline diabetes, he has tried to lose weight because of this. The patient indicates that he has increased discomfort in the legs with walking, but he also has pain that will wake him up at night. The patient has lancinating pain in the legs but also has a burning sensation. With prolonged walking, the patient may have numbness that goes up the legs, right greater than left, he may have numbness into the mid thigh level. The patient has a mild gait instability problem, he does not report any recent falls. He has been on gabapentin which has been somewhat helpful but the gabapentin has increased his gait instability. The patient returns to the office today for an evaluation. Previous EMG and nerve conduction study evaluation was unremarkable, nerve conduction studies were normal.   Past Medical History:  Diagnosis Date  . GERD (gastroesophageal reflux disease)   . History of migraine    resolved  . Hypertension    states under control with med., has been on med. x 5 yr.  . Insomnia   . Paresthesia 04/27/2015  . Primary localized osteoarthritis of left knee 12/17/2014  . Restless leg syndrome   . RLS (restless legs syndrome) 04/27/2015  . Sleep apnea    no CPAP use  . Unilateral osteoarthritis of knee 12/2014   left    Past Surgical History:  Procedure Laterality Date  . PARTIAL KNEE ARTHROPLASTY Right 06/02/2013   Procedure: RIGHT UNICOMPARTMENTAL KNEE;  Surgeon: Johnny Bridge, MD;  Location: [facility];  Service: Orthopedics;  Laterality: Right;  . PARTIAL KNEE ARTHROPLASTY Left 12/17/2014   Procedure: LEFT KNEE UNI ARTHROPLASTY ;  Surgeon: Marchia Bond, MD;  Location: [facility];  Service:  Orthopedics;  Laterality: Left;  . TONSILLECTOMY  1950's    Family History  Problem Relation Age of Onset  . Emphysema Father        Died age 63  . COPD Father   . Sudden death Mother        Died age 37  . CAD Brother        Died age 3  . Hypertension Brother     Social history:  reports that he has quit smoking. He has never used smokeless tobacco. He reports that he drinks alcohol. He reports that he does not use drugs.    Allergies  Allergen Reactions  . Augmentin [Amoxicillin-Pot Clavulanate] Nausea Only       . Penicillins Nausea Only    Medications:  Prior to Admission medications   Medication Sig Start Date End Date Taking? Authorizing Provider  aspirin 81 MG tablet Take 81 mg by mouth daily.   Yes [provider]  cholecalciferol (VITAMIN D) 1000 UNITS tablet Take 1,000 Units by mouth daily.   Yes [provider]  clonazePAM (KLONOPIN) 1 MG tablet Take 1 mg by mouth at bedtime.   Yes [provider]  gabapentin (NEURONTIN) 300 MG capsule One capsule in the morning and two in the evening 04/27/15  Yes Kathrynn Ducking, MD  metFORMIN (GLUCOPHAGE) 500 MG tablet Take 500 mg by mouth 2 (two) times daily with a meal.   Yes [provider]  metoprolol  tartrate (LOPRESSOR) 25 MG tablet Take 25 mg by mouth.   Yes [provider]  Multiple Vitamin (MULTIVITAMIN) tablet Take 1 tablet by mouth daily.   Yes [provider]  omeprazole (PRILOSEC) 20 MG capsule Take 20 mg by mouth daily.    Yes [provider]  rOPINIRole (REQUIP) 2 MG tablet Take 2 mg by mouth 3 (three) times daily.    Yes [provider]  traMADol (ULTRAM) 50 MG tablet Take 50 mg by mouth 4 (four) times daily.   Yes [provider]  vitamin E 100 UNIT capsule Take by mouth daily.   Yes [provider]    ROS:  Out of a complete 14 system review of symptoms, the patient complains only of the following symptoms, and all  other reviewed systems are negative.  Restless leg syndrome, insomnia, sleep apnea Joint pain, walking difficulty Moles Numbness  Blood pressure 123/82, pulse 74, height 5\' 11"  (1.803 m), weight 235 lb 12.8 oz (107 kg).  Physical Exam  General: The patient is alert and cooperative at the time of the examination. The patient is moderately obese.  Skin: No significant peripheral edema is noted.   Neurologic Exam  Mental status: The patient is alert and oriented x 3 at the time of the examination. The patient has apparent normal recent and remote memory, with an apparently normal attention span and concentration ability.   Cranial nerves: Facial symmetry is present. Speech is normal, no aphasia or dysarthria is noted. Extraocular movements are full. Visual fields are full.  Motor: The patient has good strength in all 4 extremities.  Sensory examination: Soft touch sensation is symmetric on the face, arms, and legs.  Coordination: The patient has good finger-nose-finger and heel-to-shin bilaterally.  Gait and station: The patient has a normal gait. Tandem gait is slightly unsteady. Romberg is negative. No drift is seen.  Reflexes: Deep tendon reflexes are symmetric. Ankle jerk reflexes are maintained bilaterally.   Assessment/Plan:  1. Bilateral leg pain, possible small fiber neuropathy  The patient is reporting pain with walking but also with rest. He reports some numbness that goes up the legs with walking and is asymmetric, worse on the right and goes to the mid thigh level. For this reason, we will evaluate him for possible lumbosacral spinal stenosis. He will have MRI of the low back, he will be placed on Cymbalta taking 30 mg twice daily. If this is not effective, we may go up on the dose of the gabapentin or Cymbalta, in the future may consider carbamazepine for the lancinating pains. He will follow-up in 4 or 5 months.  Jill Alexanders MD 11/20/2016 10:33 AM  [facility] [address] [place], [place] [address]  Phone [phone] Fax [phone]

## 2016-11-26 ENCOUNTER — Encounter: Payer: Medicare Other | Attending: Internal Medicine | Admitting: Nutrition

## 2016-11-26 VITALS — Ht 71.0 in | Wt 239.0 lb

## 2016-11-26 DIAGNOSIS — Z713 Dietary counseling and surveillance: Secondary | ICD-10-CM | POA: Insufficient documentation

## 2016-11-26 DIAGNOSIS — E119 Type 2 diabetes mellitus without complications: Secondary | ICD-10-CM

## 2016-11-26 DIAGNOSIS — E661 Drug-induced obesity: Secondary | ICD-10-CM

## 2016-11-26 DIAGNOSIS — Z6833 Body mass index (BMI) 33.0-33.9, adult: Secondary | ICD-10-CM

## 2016-11-26 NOTE — Progress Notes (Signed)
Diabetes Self-Management Education  Visit Type:  Follow-up  Appt. Start Time: 1030 Appt. End Time: 1100  11/26/2016  [name], identified by name and date of birth, is a 71 y.o. male with a diagnosis of Diabetes: Type 2. He is feeling better. A1C 6% in May. Biggest problem he is having is neuropathy in his feet and legs, which limits his mobility and access to exercise. May consider water aerobics and recumbent bike. Has cut back on portions, eating more fresh fruits and whole grains. Admits to needing to eat more lower carb vegetables. Drinking more water. Making good progress. Testing twice a day; FBS 110-117 mg/dl and evenings 100-130's mg/dl. .     ASSESSMENT  Height 5\' 11"  (1.803 m), weight 239 lb (108.4 kg). Body mass index is 33.33 kg/m.       Diabetes Self-Management Education - 11/26/16 1037      Health Coping   How would you rate your overall health? Good     Complications   Last HgB A1C per patient/outside source 6 %   How often do you check your blood sugar? 1-2 times/day   Fasting Blood glucose range (mg/dL) 70-129   Postprandial Blood glucose range (mg/dL) 70-129     Subsequent Visit   Since your last visit have you continued or begun to take your medications as prescribed? (P)  No   Since your last visit have you experienced any weight changes? (P)  Loss    Reviewed eating out, meal planning, high fiber foods, need for increase fiber and whole grains and fresh fruits and vegetables. Benefits of exercise and appropriate ones due to neuropathy in feet and legs.  Learning Objective:  Patient will have a greater understanding of diabetes self-management. Patient education plan is to attend individual and/or group sessions per assessed needs and concerns.   Plan:   Patient Instructions  Goals 1. Keep drinking more water 2. Increase low carb vegetables. 3.  Don't skip meals.  Consider Healthy Choice or Smart Ones for meals occasionally.  Rotate resting  blood sugars.    Expected Outcomes:    Improved self management of his diabetes, less complications.  Education material provided: Food label handouts, A1C conversion sheet and Meal plan card  If problems or questions, patient to contact team via:  Phone and Email  Future DSME appointment: -   3 months.

## 2016-11-26 NOTE — Patient Instructions (Addendum)
Goals 1. Keep drinking more water 2. Increase low carb vegetables. 3.  Don't skip meals.  Consider Healthy Choice or Smart Ones for meals occasionally.  Rotate resting blood sugars. Try water aerobics or recumbent bike for exercise due to your neuropathy.

## 2016-12-12 ENCOUNTER — Encounter: Payer: Self-pay | Admitting: Neurology

## 2016-12-30 ENCOUNTER — Ambulatory Visit
Admission: RE | Admit: 2016-12-30 | Discharge: 2016-12-30 | Disposition: A | Discharge status: Home / Self Care | Payer: Medicare Other | Source: Ambulatory Visit | Attending: Neurology | Admitting: Neurology

## 2016-12-30 DIAGNOSIS — R202 Paresthesia of skin: Secondary | ICD-10-CM | POA: Diagnosis not present

## 2016-12-30 DIAGNOSIS — M79604 Pain in right leg: Secondary | ICD-10-CM | POA: Diagnosis not present

## 2016-12-30 DIAGNOSIS — M79605 Pain in left leg: Secondary | ICD-10-CM | POA: Diagnosis not present

## 2016-12-31 ENCOUNTER — Telehealth: Payer: Self-pay | Admitting: Neurology

## 2016-12-31 NOTE — Telephone Encounter (Signed)
  I called the patient. The MRI of the low back shows some possibility of L5 nerve root compression bilaterally. The patient does have bilateral leg discomfort, we may consider an epidural steroid injection.  If he is amenable to this, he is to contact this office and we will get the procedure set up.  MRI lumbar 12/31/16:  IMPRESSION:  This MRI of the lumbar spine without contrast shows the following: 1.   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 There is possible left L5 nerve root compression and there is also some encroachment on the right L5 nerve root with less potential for compression. 2.   There are milder degenerative changes at T12-L1, L2-L3, L3-L4 and L5-S1 that did not lead to any nerve root compression

## 2017-02-07 ENCOUNTER — Encounter: Payer: Self-pay | Admitting: Neurology

## 2017-02-07 ENCOUNTER — Other Ambulatory Visit: Payer: Self-pay | Admitting: Neurology

## 2017-02-08 ENCOUNTER — Other Ambulatory Visit: Payer: Self-pay | Admitting: Neurology

## 2017-02-08 MED ORDER — GABAPENTIN 300 MG PO CAPS: 900.0000 mg | ORAL_CAPSULE | Freq: Two times a day (BID) | ORAL | 1 refills | Status: DC

## 2017-02-11 ENCOUNTER — Encounter: Payer: Self-pay | Admitting: Neurology

## 2017-02-13 ENCOUNTER — Other Ambulatory Visit: Payer: Self-pay | Admitting: *Deleted

## 2017-02-13 MED ORDER — GABAPENTIN 300 MG PO CAPS: 900.0000 mg | ORAL_CAPSULE | Freq: Two times a day (BID) | ORAL | 1 refills | Status: DC

## 2017-02-26 ENCOUNTER — Other Ambulatory Visit: Payer: Self-pay | Admitting: Neurology

## 2017-03-18 ENCOUNTER — Ambulatory Visit: Payer: Medicare Other | Admitting: Neurology

## 2017-03-18 ENCOUNTER — Telehealth: Payer: Self-pay | Admitting: Neurology

## 2017-03-18 NOTE — Telephone Encounter (Signed)
Dr. Jannifer Franklin- are you okay with working him back in sooner?

## 2017-03-18 NOTE — Telephone Encounter (Signed)
This patient did not show for a RV appointment today. 

## 2017-03-18 NOTE — Telephone Encounter (Signed)
We will try to work him in sometime in early 2019.

## 2017-03-18 NOTE — Telephone Encounter (Signed)
Called, LVM for pt offering 04/08/16 at 12pm, check in 1130am. Asked him to call back to let us know if this works for him

## 2017-03-19 NOTE — Telephone Encounter (Signed)
Tried pt again. LVM for him to call. Please offer appt below if he calls

## 2017-03-20 ENCOUNTER — Ambulatory Visit: Payer: Medicare Other | Admitting: Adult Health

## 2017-03-21 NOTE — Telephone Encounter (Signed)
Called wife number on [place]: [phone]. Spoke with wife. She gave phone to husband. Advised his number not working. He has not received my messages. I made appt for 04/08/17 at 12pm, check in 1130am. He verbalized understanding and agreeable to appt date/time.

## 2017-03-21 NOTE — Telephone Encounter (Signed)
Tried calling pt again since no return call yet. Asked him to call back.

## 2017-04-08 ENCOUNTER — Ambulatory Visit: Payer: Medicare Other | Admitting: Neurology

## 2017-04-08 ENCOUNTER — Other Ambulatory Visit: Payer: Self-pay | Admitting: Neurology

## 2017-04-08 ENCOUNTER — Encounter: Payer: Self-pay | Admitting: Neurology

## 2017-04-08 VITALS — BP 136/94 | HR 79 | Ht 71.0 in | Wt 232.0 lb

## 2017-04-08 DIAGNOSIS — E538 Deficiency of other specified B group vitamins: Secondary | ICD-10-CM

## 2017-04-08 DIAGNOSIS — G2581 Restless legs syndrome: Secondary | ICD-10-CM | POA: Diagnosis not present

## 2017-04-08 DIAGNOSIS — R202 Paresthesia of skin: Secondary | ICD-10-CM | POA: Diagnosis not present

## 2017-04-08 DIAGNOSIS — R413 Other amnesia: Secondary | ICD-10-CM

## 2017-04-08 MED ORDER — DULOXETINE HCL 60 MG PO CPEP: 60.0000 mg | ORAL_CAPSULE | Freq: Two times a day (BID) | ORAL | 3 refills | Status: DC

## 2017-04-08 NOTE — Progress Notes (Addendum)
Reason for visit: Peripheral neuropathy  Jason [name] is an 72 y.o. male  History of present illness:  Jason [name] is a 72 year old right-handed white male with a history of diabetes and a history of discomfort in the feet.  The patient also has what sounds like meralgia paresthetica on the right thigh with burning and paresthesias.  The patient is not sleeping well at night because of the neuropathy, he is on Cymbalta taking 30 mg twice daily, he takes gabapentin 900 mg twice daily, he has been on Lyrica previously.  The patient is having difficulty sensing where his feet are with driving a car.  He also believes that there has been some change in his cognitive capacity over the last 6-8 months.  The patient is having problems with general memory, he works part-time at [employer], he is having more difficulty keeping up with this.  The patient had an event on 18 March 2017 of problems with heightened confusion that lasted 2-3 days.  This eventually cleared.  The patient does have sleep apnea, he has not used his CPAP machine in over 2 years.  He does have problems with fatigue during the day.  He denies any headaches or dizziness, he has fallen on several occasions associated with the neuropathy.  The patient returns to the office today for an evaluation.  Past Medical History:  Diagnosis Date  . GERD (gastroesophageal reflux disease)   . History of migraine    resolved  . Hypertension    states under control with med., has been on med. x 5 yr.  . Insomnia   . Paresthesia 04/27/2015  . Primary localized osteoarthritis of left knee 12/17/2014  . Restless leg syndrome   . RLS (restless legs syndrome) 04/27/2015  . Sleep apnea    no CPAP use  . Unilateral osteoarthritis of knee 12/2014   left    Past Surgical History:  Procedure Laterality Date  . PARTIAL KNEE ARTHROPLASTY Right 06/02/2013   Procedure: RIGHT UNICOMPARTMENTAL KNEE;  Surgeon: Johnny Bridge, MD;  Location: [facility];  Service: Orthopedics;  Laterality: Right;  . PARTIAL KNEE ARTHROPLASTY Left 12/17/2014   Procedure: LEFT KNEE UNI ARTHROPLASTY ;  Surgeon: Marchia Bond, MD;  Location: [facility];  Service: Orthopedics;  Laterality: Left;  . TONSILLECTOMY  1950's    Family History  Problem Relation Age of Onset  . Emphysema Father        Died age 70  . COPD Father   . Sudden death Mother        Died age 55  . CAD Brother        Died age 59  . Hypertension Brother     Social history:  reports that he has quit smoking. he has never used smokeless tobacco. He reports that he drinks alcohol. He reports that he does not use drugs.    Allergies  Allergen Reactions  . Augmentin [Amoxicillin-Pot Clavulanate] Nausea Only       . Penicillins Nausea Only    Medications:  Prior to Admission medications   Medication Sig Start Date End Date Taking? Authorizing Provider  aspirin 81 MG tablet Take 81 mg by mouth daily.   Yes [provider]  cholecalciferol (VITAMIN D) 1000 UNITS tablet Take 1,000 Units by mouth daily.   Yes [provider]  clonazePAM (KLONOPIN) 1 MG tablet Take 1 mg by mouth at bedtime.   Yes [provider]  DULoxetine (CYMBALTA)  30 MG capsule TAKE 1 CAPSULE BY MOUTH TWICE DAILY 02/26/17  Yes Kathrynn Ducking, MD  gabapentin (NEURONTIN) 300 MG capsule Take 3 capsules (900 mg total) 2 (two) times daily by mouth. 02/13/17  Yes Kathrynn Ducking, MD  metFORMIN (GLUCOPHAGE) 500 MG tablet Take 500 mg by mouth 2 (two) times daily with a meal.   Yes [provider]  metoprolol tartrate (LOPRESSOR) 25 MG tablet Take 25 mg by mouth.   Yes [provider]  Multiple Vitamin (MULTIVITAMIN) tablet Take 1 tablet by mouth daily.   Yes [provider]  omeprazole (PRILOSEC) 20 MG capsule Take 20 mg by mouth daily.    Yes [provider]  rOPINIRole (REQUIP) 2 MG tablet Take 2 mg by mouth 3 (three) times daily.    Yes  [provider]  traMADol (ULTRAM) 50 MG tablet Take 50 mg by mouth 4 (four) times daily.   Yes [provider]  vitamin E 100 UNIT capsule Take by mouth daily.   Yes [provider]    ROS:  Out of a complete 14 system review of symptoms, the patient complains only of the following symptoms, and all other reviewed systems are negative.  Light sensitivity, blurred vision Restless legs, insomnia, sleep apnea, snoring Walking difficulty Memory loss  Blood pressure (!) 136/94, pulse 79, height 5\' 11"  (1.803 m), weight 232 lb (105.2 kg).  Physical Exam  General: The patient is alert and cooperative at the time of the examination.  Skin: No significant peripheral edema is noted.   Neurologic Exam  Mental status: The patient is alert and oriented x 3 at the time of the examination. The patient has apparent normal recent and remote memory, with an apparently normal attention span and concentration ability.   Cranial nerves: Facial symmetry is present. Speech is normal, no aphasia or dysarthria is noted. Extraocular movements are full. Visual fields are full.  Motor: The patient has good strength in all 4 extremities.  Sensory examination: Soft touch sensation is symmetric on the face, arms, and legs.  Coordination: The patient has good finger-nose-finger and heel-to-shin bilaterally.  Gait and station: The patient has a normal gait. Tandem gait is slightly unsteady. Romberg is negative. No drift is seen.  Reflexes: Deep tendon reflexes are symmetric, reflexes are well-maintained throughout.   Assessment/Plan:  1.  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  Sleep apnea, not on CPAP  4.  Reported memory disturbance  The patient is having two issues with driving, first is that he is having difficulty with feeling where his feet are in space.  The patient may need to convert to hand controls  with his vehicle.  The patient is also having some problems with focusing with driving, he feels somewhat unsafe.  The patient will need to undergo a workup for a possible memory disorder.  The patient will undergo blood work today and will he will have MRI of the brain.  The pain from the neuropathy needs to be better treated, the patient is not sleeping well and this may be a source of his cognitive changes.  The patient will go up on the Cymbalta taking 60 mg twice daily.  He will try to get back on the CPAP.  He will follow-up in 6 months.  He has had MRI evaluation of the lumbar spine, this did not show evidence of spinal stenosis, possible L5 nerve root impingement was noted, this  would not explain all of his symptoms however.  Jill Alexanders MD 04/08/2017 12:07 PM  [facility] [address] [place], [place] [address]  Phone [phone] Fax [phone]

## 2017-04-08 NOTE — Patient Instructions (Signed)
   We will et blood work today. We will get MRI of the brain and increase the Cymbalta to 60 mg twice a day.

## 2017-04-09 LAB — VITAMIN B12: Vitamin B-12: 477 pg/mL (ref 232–1245)

## 2017-04-09 LAB — RPR: RPR Ser Ql: NONREACTIVE

## 2017-04-09 LAB — SEDIMENTATION RATE: Sed Rate: 19 mm/hr (ref 0–30)

## 2017-04-21 ENCOUNTER — Ambulatory Visit
Admission: RE | Admit: 2017-04-21 | Discharge: 2017-04-21 | Disposition: A | Discharge status: Home / Self Care | Payer: Medicare Other | Source: Ambulatory Visit | Attending: Neurology | Admitting: Neurology

## 2017-04-21 ENCOUNTER — Telehealth: Payer: Self-pay | Admitting: Neurology

## 2017-04-21 DIAGNOSIS — R413 Other amnesia: Secondary | ICD-10-CM | POA: Diagnosis not present

## 2017-04-21 NOTE — Telephone Encounter (Signed)
MRI of the brain shows mild bihemispheric white matter changes.  No acute changes are seen.  I called the patient concerning the changes, the patient does have risk factors for cerebrovascular disease with diabetes and hypertension.  He is on low-dose aspirin.    MR brain 04/21/17:  IMPRESSION:  Slightly abnormal MRI scan the brain showing mild changes of chronic microvascular ischemia.

## 2017-04-29 ENCOUNTER — Encounter: Payer: Self-pay | Admitting: Neurology

## 2017-05-29 ENCOUNTER — Ambulatory Visit: Payer: Medicare Other | Admitting: Nutrition

## 2017-07-24 ENCOUNTER — Other Ambulatory Visit: Payer: Self-pay | Admitting: Neurology

## 2017-08-02 ENCOUNTER — Ambulatory Visit: Payer: Medicare Other | Admitting: Neurology

## 2017-08-08 ENCOUNTER — Other Ambulatory Visit: Payer: Self-pay | Admitting: Neurology

## 2017-09-02 ENCOUNTER — Other Ambulatory Visit: Payer: Self-pay | Admitting: Neurology

## 2017-10-08 ENCOUNTER — Ambulatory Visit: Payer: Medicare Other | Admitting: Neurology

## 2017-11-06 ENCOUNTER — Encounter: Payer: Self-pay | Admitting: Neurology

## 2017-11-06 ENCOUNTER — Ambulatory Visit: Payer: Medicare Other | Admitting: Neurology

## 2017-11-06 VITALS — BP 123/87 | HR 81 | Ht 71.0 in | Wt 234.5 lb

## 2017-11-06 DIAGNOSIS — G2581 Restless legs syndrome: Secondary | ICD-10-CM | POA: Diagnosis not present

## 2017-11-06 DIAGNOSIS — G603 Idiopathic progressive neuropathy: Secondary | ICD-10-CM

## 2017-11-06 DIAGNOSIS — G629 Polyneuropathy, unspecified: Secondary | ICD-10-CM

## 2017-11-06 HISTORY — DX: Polyneuropathy, unspecified: G62.9

## 2017-11-06 MED ORDER — GABAPENTIN 800 MG PO TABS: 800.0000 mg | ORAL_TABLET | Freq: Three times a day (TID) | ORAL | 2 refills | Status: DC

## 2017-11-06 NOTE — Progress Notes (Signed)
Reason for visit: Peripheral neuropathy  Jason [name] is an 72 y.o. male  History of present illness:  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  The patient does have some low back pain issues as well.  He indicates that driving a car long distance will increase the back pain.  He has burning sensations and occasional lancinating pains in his feet, the use of Cymbalta has helped the lancinating pain.  The patient has recently had some problems with sweats and nausea, he has been on Requip for a number of years, he does not believe that this is the etiology of his nausea.  He takes 2 mg 3 times daily.  The patient indicates that he gets about 5 hours of sleep at night, he has reported some memory issues recently.  MRI of the brain was done showing a mild to moderate level of small vessel disease, he was to go on low-dose aspirin.  The patient is on 1800 mg daily of gabapentin, he seems to tolerate this fairly well.  He does report some gait instability, he has good days and bad days with this, he may use a cane on occasion.  He currently is off of his CPAP, he may be restarted on this in the near future.  He returns this office for an evaluation.  Past Medical History:  Diagnosis Date  . GERD (gastroesophageal reflux disease)   . History of migraine    resolved  . Hypertension    states under control with med., has been on med. x 5 yr.  . Insomnia   . Paresthesia 04/27/2015  . Primary localized osteoarthritis of left knee 12/17/2014  . Restless leg syndrome   . RLS (restless legs syndrome) 04/27/2015  . Sleep apnea    no CPAP use  . Unilateral osteoarthritis of knee 12/2014   left    Past Surgical History:  Procedure Laterality Date  . PARTIAL KNEE ARTHROPLASTY Right 06/02/2013   Procedure: RIGHT UNICOMPARTMENTAL KNEE;  Surgeon: Johnny Bridge, MD;  Location: [place];   Service: Orthopedics;  Laterality: Right;  . PARTIAL KNEE ARTHROPLASTY Left 12/17/2014   Procedure: LEFT KNEE UNI ARTHROPLASTY ;  Surgeon: Marchia Bond, MD;  Location: [place];  Service: Orthopedics;  Laterality: Left;  . TONSILLECTOMY  1950's    Family History  Problem Relation Age of Onset  . Emphysema Father        Died age 21  . COPD Father   . Sudden death Mother        Died age 34  . CAD Brother        Died age 47  . Hypertension Brother     Social history:  reports that he has quit smoking. He has never used smokeless tobacco. He reports that he drinks alcohol. He reports that he does not use drugs.    Allergies  Allergen Reactions  . Augmentin [Amoxicillin-Pot Clavulanate] Nausea Only       . Penicillins Nausea Only    Medications:  Prior to Admission medications   Medication Sig Start Date End Date Taking? Authorizing Provider  aspirin 81 MG tablet Take 81 mg by mouth daily.   Yes [provider]  cholecalciferol (VITAMIN D) 1000 UNITS tablet Take 1,000 Units by mouth daily.   Yes [provider]  clonazePAM (KLONOPIN) 1 MG tablet Take 1  mg by mouth at bedtime.   Yes [provider]  DULoxetine (CYMBALTA) 60 MG capsule TAKE 1 CAPSULE BY MOUTH TWICE DAILY 09/02/17  Yes Kathrynn Ducking, MD  gabapentin (NEURONTIN) 300 MG capsule TAKE THREE CAPSULES BY MOUTH TWICE DAILY 08/08/17  Yes Kathrynn Ducking, MD  metFORMIN (GLUCOPHAGE) 500 MG tablet Take 500 mg by mouth 2 (two) times daily with a meal.   Yes [provider]  metoprolol tartrate (LOPRESSOR) 25 MG tablet Take 25 mg by mouth.   Yes [provider]  Multiple Vitamin (MULTIVITAMIN) tablet Take 1 tablet by mouth daily.   Yes [provider]  pantoprazole (PROTONIX) 40 MG tablet Take 40 mg by mouth daily.   Yes [provider]  rOPINIRole (REQUIP) 2 MG tablet Take 2 mg by mouth 3 (three) times daily.    Yes [provider]    vitamin E 100 UNIT capsule Take by mouth daily.   Yes [provider]    ROS:  Out of a complete 14 system review of symptoms, the patient complains only of the following symptoms, and all other reviewed systems are negative.  Fatigue, excessive sweating Restless legs, insomnia Joint pain, back pain, walking difficulty Dizziness, numbness, weakness Confusion, depression  Blood pressure 123/87, pulse 81, height 5\' 11"  (1.803 m), weight 234 lb 8 oz (106.4 kg).  Physical Exam  General: The patient is alert and cooperative at the time of the examination.  The patient is moderately obese.  Skin: No significant peripheral edema is noted.   Neurologic Exam  Mental status: The patient is alert and oriented x 3 at the time of the examination. The patient has apparent normal recent and remote memory, with an apparently normal attention span and concentration ability.  Mini-Mental status examination done today shows a total score 28/30.   Cranial nerves: Facial symmetry is present. Speech is normal, no aphasia or dysarthria is noted. Extraocular movements are full. Visual fields are full.  Motor: The patient has good strength in all 4 extremities.  Sensory examination: Soft touch sensation is symmetric on the face, arms, and legs.  Coordination: The patient has good finger-nose-finger and heel-to-shin bilaterally.  Gait and station: The patient has a normal gait. Tandem gait is slightly unsteady. Romberg is negative. No drift is seen.  Reflexes: Deep tendon reflexes are symmetric.  Ankle jerk reflexes are present, symmetric.   MR brain 04/21/17:  IMPRESSION: Slightly abnormal MRI scan the brain showing mild changes of chronic microvascular ischemia.  * MRI scan images were reviewed online. I agree with the written report.    Assessment/Plan:  1.  Peripheral neuropathy, small fiber  2.  Diabetes  3.  Sleep apnea on CPAP  4.  Chronic low back pain  5.   Reported memory disturbance  The patient will have an increase in the gabapentin dosing taking 800 mg 3 times daily.  The patient may be getting back on CPAP, he needs to sleep better at night, hopefully this will improve the memory.  The patient will contact me if he continues to have significant discomfort.  He will follow-up otherwise in 6 months.  We will follow the memory issues over time.   Jill Alexanders MD 11/06/2017 8:09 AM  [facility] [address] [place], [place] [address]  Phone [phone] Fax [phone]

## 2017-11-06 NOTE — Patient Instructions (Signed)
We will increase the gabapentin to 800 mg three times a day.  Neurontin (gabapentin) may result in drowsiness, ankle swelling, gait instability, or possibly dizziness. Please contact our office if significant side effects occur with this medication.

## 2017-12-23 ENCOUNTER — Other Ambulatory Visit: Payer: Self-pay | Admitting: Neurology

## 2018-01-22 ENCOUNTER — Other Ambulatory Visit: Payer: Self-pay | Admitting: Neurology

## 2018-05-09 ENCOUNTER — Encounter: Payer: Self-pay | Admitting: Neurology

## 2018-05-09 ENCOUNTER — Ambulatory Visit: Payer: Medicare Other | Admitting: Neurology

## 2018-05-09 VITALS — BP 118/77 | HR 87 | Ht 71.0 in | Wt 240.0 lb

## 2018-05-09 DIAGNOSIS — G2581 Restless legs syndrome: Secondary | ICD-10-CM

## 2018-05-09 DIAGNOSIS — G603 Idiopathic progressive neuropathy: Secondary | ICD-10-CM | POA: Diagnosis not present

## 2018-05-09 DIAGNOSIS — R413 Other amnesia: Secondary | ICD-10-CM | POA: Diagnosis not present

## 2018-05-09 HISTORY — DX: Other amnesia: R41.3

## 2018-05-09 MED ORDER — CARBAMAZEPINE 200 MG PO TABS: 100.0000 mg | ORAL_TABLET | Freq: Two times a day (BID) | ORAL | 3 refills | Status: DC

## 2018-05-09 MED ORDER — ROPINIROLE HCL 2 MG PO TABS: ORAL_TABLET | ORAL | 1 refills | Status: DC

## 2018-05-09 MED ORDER — DULOXETINE HCL 60 MG PO CPEP: 60.0000 mg | ORAL_CAPSULE | Freq: Two times a day (BID) | ORAL | 3 refills | Status: DC

## 2018-05-09 NOTE — Progress Notes (Signed)
Reason for visit: Small fiber neuropathy, memory disorder, restless leg syndrome  Jason [name] is an 73 y.o. male  History of present illness:  Jason [name] is a 73 year old right-handed white male with a history of diabetes with a small fiber neuropathy.  The patient has a significant amount of discomfort associated with the neuropathy, he has discomfort up to the knee levels bilaterally, he will have lancinating pains at times.  The patient has difficulty sleeping in part secondary to this and in part related to his restless leg syndrome.  He takes Requip 2 mg 3 times daily, this is not adequate many nights to allow him to rest.  The patient does have some mild gait instability, he has not had any falls recently.  He will stumble.  He reports bilateral knee pain.  He has ongoing chronic fatigue, he has sleep apnea but he is not on CPAP.  The patient has just started melatonin at night, he has a TENS unit to try to see if this helps the pain.  The patient returns to this office for an evaluation.  The patient continues to note some problems with memory, he is somewhat forgetful, he will have trouble with directions with driving at times.  He is continue to work however.  Past Medical History:  Diagnosis Date  . GERD (gastroesophageal reflux disease)   . History of migraine    resolved  . Hypertension    states under control with med., has been on med. x 5 yr.  . Insomnia   . Paresthesia 04/27/2015  . Peripheral neuropathy 11/06/2017  . Primary localized osteoarthritis of left knee 12/17/2014  . Restless leg syndrome   . RLS (restless legs syndrome) 04/27/2015  . Sleep apnea    no CPAP use  . Unilateral osteoarthritis of knee 12/2014   left    Past Surgical History:  Procedure Laterality Date  . PARTIAL KNEE ARTHROPLASTY Right 06/02/2013   Procedure: RIGHT UNICOMPARTMENTAL KNEE;  Surgeon: Johnny Bridge, MD;  Location: [place];  Service: Orthopedics;  Laterality: Right;  . PARTIAL KNEE  ARTHROPLASTY Left 12/17/2014   Procedure: LEFT KNEE UNI ARTHROPLASTY ;  Surgeon: Marchia Bond, MD;  Location: [place];  Service: Orthopedics;  Laterality: Left;  . TONSILLECTOMY  1950's    Family History  Problem Relation Age of Onset  . Emphysema Father        Died age 36  . COPD Father   . Sudden death Mother        Died age 56  . CAD Brother        Died age 65  . Hypertension Brother     Social history:  reports that he has quit smoking. He has never used smokeless tobacco. He reports current alcohol use. He reports that he does not use drugs.    Allergies  Allergen Reactions  . Augmentin [Amoxicillin-Pot Clavulanate] Nausea Only       . Penicillins Nausea Only    Medications:  Prior to Admission medications   Medication Sig Start Date End Date Taking? Authorizing Provider  aspirin 81 MG tablet Take 81 mg by mouth daily.   Yes [provider]  cholecalciferol (VITAMIN D) 1000 UNITS tablet Take 1,000 Units by mouth daily.   Yes [provider]  clonazePAM (KLONOPIN) 1 MG tablet Take 1 mg by mouth at bedtime.   Yes [provider]  DULoxetine (CYMBALTA) 60 MG capsule TAKE 1 CAPSULE BY MOUTH TWICE  DAILY 12/23/17  Yes Kathrynn Ducking, MD  gabapentin (NEURONTIN) 800 MG tablet Take 1 tablet (800 mg total) by mouth 3 (three) times daily. 11/06/17  Yes Kathrynn Ducking, MD  metFORMIN (GLUCOPHAGE) 500 MG tablet Take 500 mg by mouth 2 (two) times daily with a meal.   Yes [provider]  metoprolol tartrate (LOPRESSOR) 25 MG tablet Take 25 mg by mouth.   Yes [provider]  Multiple Vitamin (MULTIVITAMIN) tablet Take 1 tablet by mouth daily.   Yes [provider]  pantoprazole (PROTONIX) 40 MG tablet Take 40 mg by mouth daily.   Yes [provider]  ranitidine (ZANTAC) 150 MG tablet Take 150 mg by mouth.  11/04/17  Yes [provider]  rOPINIRole (REQUIP) 2 MG tablet Take 2 mg by mouth 3 (three)  times daily.    Yes [provider]  vitamin E 100 UNIT capsule Take by mouth daily.   Yes [provider]    ROS:  Out of a complete 14 system review of symptoms, the patient complains only of the following symptoms, and all other reviewed systems are negative.  Decreased appetite, fatigue Excessive thirst Restless legs, insomnia, sleep apnea Urinary urgency Joint pain, back pain, walking difficulty Confusion  Blood pressure 118/77, pulse 87, height 5\' 11"  (1.803 m), weight 240 lb (108.9 kg).  Physical Exam  General: The patient is alert and cooperative at the time of the examination.  The patient is markedly obese.  Skin: No significant peripheral edema is noted.   Neurologic Exam  Mental status: The patient is alert and oriented x 3 at the time of the examination. The Mini-Mental status examination done today shows a total score of 28/30.  The patient is able to name 9 four-legged animals in 1 minute..   Cranial nerves: Facial symmetry is present. Speech is normal, no aphasia or dysarthria is noted. Extraocular movements are full. Visual fields are full.  Motor: The patient has good strength in all 4 extremities.  Sensory examination: Soft touch sensation is symmetric on the face, arms, and legs.  Coordination: The patient has good finger-nose-finger and heel-to-shin bilaterally.  Gait and station: The patient has a normal gait. Tandem gait is normal. Romberg is negative. No drift is seen.  Reflexes: Deep tendon reflexes are symmetric.   Assessment/Plan:  1.  Small fiber neuropathy  2.  Mild memory disturbance  3.  Restless leg syndrome  The patient is still having a lot of difficulty with the restless leg syndrome, we will go up on the dose of the Requip taking 2 mg, 1 tablet twice daily and 2 at night.  Given the lancinating pains with the neuropathy, carbamazepine will be added taking the 200 mg tablet, 1/2 tablet twice daily.  He will call  for any dose adjustments.  He will follow-up in 6 months.  He will continue the Cymbalta 60 mg twice daily.    Jill Alexanders MD 05/09/2018 9:25 AM  [facility] [address] [place], [place] [address]  Phone [phone] Fax [phone]

## 2018-08-20 ENCOUNTER — Other Ambulatory Visit: Payer: Self-pay | Admitting: Neurology

## 2018-08-22 ENCOUNTER — Other Ambulatory Visit: Payer: Self-pay | Admitting: Urology

## 2018-09-02 NOTE — Progress Notes (Signed)
05/09/2018- noted in Epic-office visit with Dr. Margette Fast

## 2018-09-02 NOTE — Patient Instructions (Addendum)
SAID RUEB  09/02/2018   Your procedure is scheduled on: Monday 09/08/2018   Report to [facility] Main  Entrance              Report to admitting at   [place] 19 TEST ON Thursday, September 04, 2018 @ 9:55AM, THIS TEST MUST BE DONE BEFORE SURGERY, COME TO [place].  Must  self-quarantine after testing per handout.   Call this number if you have problems the morning of surgery [phone]    Remember: Do not eat food:After Midnight May have clear liquids from midnight up until 0800 am then nothing until after surgery!    CLEAR LIQUID DIET   Foods Allowed                                                                     Foods Excluded  Water, Black Coffee and tea, regular and decaf                             liquids that you cannot  Plain Jell-O in any flavor                                             see through such as: Fruit ices (not with fruit pulp)                                     milk, soups, orange juice  Iced Popsicles                                    All solid food Carbonated beverages, regular and diet                                    Cranberry, grape and apple juices Sports drinks like Gatorade Lightly seasoned clear broth or consume(fat free) Sugar, honey syrup  Sample Menu Breakfast                                Lunch                                     Supper Cranberry juice                    Beef broth                            Chicken broth Jell-O  Grape juice                           Apple juice Coffee or tea                        Jell-O                                      Popsicle                                                Coffee or tea                        Coffee or tea  _____________________________________________________________________               BRUSH YOUR TEETH MORNING OF SURGERY AND RINSE  YOUR MOUTH OUT, NO CHEWING GUM CANDY OR MINTS.    How to Manage Your Diabetes Before and After Surgery  Why is it important to control my blood sugar before and after surgery? . Improving blood sugar levels before and after surgery helps healing and can limit problems. . A way of improving blood sugar control is eating a healthy diet by: o  Eating less sugar and carbohydrates o  Increasing activity/exercise o  Talking with your doctor about reaching your blood sugar goals . High blood sugars (greater than 180 mg/dL) can raise your risk of infections and slow your recovery, so you will need to focus on controlling your diabetes during the weeks before surgery. . Make sure that the doctor who takes care of your diabetes knows about your planned surgery including the date and location.  How do I manage my blood sugar before surgery? . Check your blood sugar at least 4 times a day, starting 2 days before surgery, to make sure that the level is not too high or low. o Check your blood sugar the morning of your surgery when you wake up and every 2 hours until you get to the Short Stay unit. . If your blood sugar is less than 70 mg/dL, you will need to treat for low blood sugar: o Do not take insulin. o Treat a low blood sugar (less than 70 mg/dL) with  cup of clear juice (cranberry or apple), 4 glucose tablets, OR glucose gel. o Recheck blood sugar in 15 minutes after treatment (to make sure it is greater than 70 mg/dL). If your blood sugar is not greater than 70 mg/dL on recheck, call [phone] for further instructions. . Report your blood sugar to the short stay nurse when you get to Short Stay.  . If you are admitted to the hospital after surgery: o Your blood sugar will be checked by the staff and you will probably be given insulin after surgery (instead of oral diabetes medicines) to make sure you have good blood sugar levels. o The goal for blood sugar control after surgery is 80-180  mg/dL.   WHAT DO I DO ABOUT MY DIABETES MEDICATION?        The day before surgery, Take Metformin as usual.  . Do not take oral diabetes medicines (pills) the morning of surgery.   Take  these medicines the morning of surgery with A SIP OF WATER: Carbamazepine ( Tegretol), Duloxetine (Cymbalta), Gabapentin (Neurontin), Pantoprazole (Protonix), Requip, Tamsulosin                              You may not have any metal on your body including hair pins and              piercings  Do not wear jewelry, make-up, lotions, powders or perfumes, deodorant                       Men may shave face and neck.   Do not bring valuables to the hospital. [place].  Contacts, dentures or bridgework may not be worn into surgery.  Leave suitcase in the car. After surgery it may be brought to your room.   Patients discharged the day of surgery will not be allowed to drive home. IF YOU ARE HAVING SURGERY AND GOING HOME THE SAME DAY, YOU MUST HAVE AN ADULT TO DRIVE YOU HOME AND BE WITH YOU FOR 24 HOURS. YOU MAY GO HOME BY TAXI OR UBER OR ORTHERWISE, BUT AN ADULT MUST ACCOMPANY YOU HOME AND STAY WITH YOU FOR 24 HOURS.   Name and phone number of your driver:                Please read over the following fact sheets you were given: _____________________________________________________________________             [facility] - Preparing for Surgery Before surgery, you can play an important role.  Because skin is not sterile, your skin needs to be as free of germs as possible.  You can reduce the number of germs on your skin by washing with CHG (chlorahexidine gluconate) soap before surgery.  CHG is an antiseptic cleaner which kills germs and bonds with the skin to continue killing germs even after washing. Please DO NOT use if you have an allergy to CHG or antibacterial soaps.  If your skin becomes reddened/irritated stop using the CHG and inform your nurse when  you arrive at Short Stay. Do not shave (including legs and underarms) for at least 48 hours prior to the first CHG shower.  You may shave your face/neck. Please follow these instructions carefully:  1.  Shower with CHG Soap the night before surgery and the  morning of Surgery.  2.  If you choose to wash your hair, wash your hair first as usual with your  normal  shampoo.  3.  After you shampoo, rinse your hair and body thoroughly to remove the  shampoo.                             4.  Use CHG as you would any other liquid soap.  You can apply chg directly  to the skin and wash                       Gently with a scrungie or clean washcloth.  5.  Apply the CHG Soap to your body ONLY FROM THE NECK DOWN.   Do not use on face/ open  Wound or open sores. Avoid contact with eyes, ears mouth and genitals (private parts).                       Wash face,  Genitals (private parts) with your normal soap.             6.  Wash thoroughly, paying special attention to the area where your surgery  will be performed.  7.  Thoroughly rinse your body with warm water from the neck down.  8.  DO NOT shower/wash with your normal soap after using and rinsing off  the CHG Soap.                9.  Pat yourself dry with a clean towel.            10.  Wear clean pajamas.            11.  Place clean sheets on your bed the night of your first shower and do not  sleep with pets. Day of Surgery : Do not apply any lotions/deodorants the morning of surgery.  Please wear clean clothes to the hospital/surgery center.  FAILURE TO FOLLOW THESE INSTRUCTIONS MAY RESULT IN THE CANCELLATION OF YOUR SURGERY PATIENT SIGNATURE_________________________________  NURSE SIGNATURE__________________________________  ________________________________________________________________________

## 2018-09-03 ENCOUNTER — Encounter (HOSPITAL_COMMUNITY)
Admission: RE | Admit: 2018-09-03 | Discharge: 2018-09-03 | Disposition: A | Discharge status: Home / Self Care | Payer: Medicare Other | Source: Ambulatory Visit | Attending: Urology | Admitting: Urology

## 2018-09-03 ENCOUNTER — Encounter (HOSPITAL_COMMUNITY): Payer: Self-pay

## 2018-09-03 ENCOUNTER — Other Ambulatory Visit: Payer: Self-pay

## 2018-09-03 DIAGNOSIS — A4901 Methicillin susceptible Staphylococcus aureus infection, unspecified site: Secondary | ICD-10-CM

## 2018-09-03 DIAGNOSIS — Z1159 Encounter for screening for other viral diseases: Secondary | ICD-10-CM | POA: Diagnosis not present

## 2018-09-03 DIAGNOSIS — Z01818 Encounter for other preprocedural examination: Secondary | ICD-10-CM | POA: Diagnosis not present

## 2018-09-03 DIAGNOSIS — Z22322 Carrier or suspected carrier of Methicillin resistant Staphylococcus aureus: Secondary | ICD-10-CM

## 2018-09-03 HISTORY — DX: Hypotension, unspecified: I95.9

## 2018-09-03 HISTORY — DX: Personal history of other diseases of the nervous system and sense organs: Z86.69

## 2018-09-03 HISTORY — DX: Type 2 diabetes mellitus without complications: E11.9

## 2018-09-03 HISTORY — DX: Transient cerebral ischemic attack, unspecified: G45.9

## 2018-09-03 HISTORY — DX: Other seasonal allergic rhinitis: J30.2

## 2018-09-03 HISTORY — DX: Personal history of diseases of the blood and blood-forming organs and certain disorders involving the immune mechanism: Z86.2

## 2018-09-03 HISTORY — DX: Carrier or suspected carrier of methicillin resistant Staphylococcus aureus: Z22.322

## 2018-09-03 HISTORY — DX: Personal history of urinary calculi: Z87.442

## 2018-09-03 HISTORY — DX: Methicillin susceptible Staphylococcus aureus infection, unspecified site: A49.01

## 2018-09-03 LAB — SURGICAL PCR SCREEN
MRSA, PCR: POSITIVE — AB
Staphylococcus aureus: POSITIVE — AB

## 2018-09-03 LAB — GLUCOSE, CAPILLARY: Glucose-Capillary: 106 mg/dL — ABNORMAL HIGH (ref 70–99)

## 2018-09-03 LAB — CBC
HCT: 41.2 % (ref 39.0–52.0)
Hemoglobin: 13.6 g/dL (ref 13.0–17.0)
MCH: 32.1 pg (ref 26.0–34.0)
MCHC: 33 g/dL (ref 30.0–36.0)
MCV: 97.2 fL (ref 80.0–100.0)
Platelets: 292 10*3/uL (ref 150–400)
RBC: 4.24 MIL/uL (ref 4.22–5.81)
RDW: 13.2 % (ref 11.5–15.5)
WBC: 6.6 10*3/uL (ref 4.0–10.5)
nRBC: 0 % (ref 0.0–0.2)

## 2018-09-03 LAB — HEMOGLOBIN A1C
Hgb A1c MFr Bld: 6.3 % — ABNORMAL HIGH (ref 4.8–5.6)
Mean Plasma Glucose: 134.11 mg/dL

## 2018-09-03 LAB — BASIC METABOLIC PANEL
Anion gap: 10 (ref 5–15)
BUN: 13 mg/dL (ref 8–23)
CO2: 26 mmol/L (ref 22–32)
Calcium: 9 mg/dL (ref 8.9–10.3)
Chloride: 103 mmol/L (ref 98–111)
Creatinine, Ser: 0.75 mg/dL (ref 0.61–1.24)
GFR calc Af Amer: >60 mL/min (ref >60)
GFR calc non Af Amer: >60 mL/min (ref >60)
Glucose, Bld: 116 mg/dL — ABNORMAL HIGH (ref 70–99)
Potassium: 4.4 mmol/L (ref 3.5–5.1)
Sodium: 139 mmol/L (ref 135–145)

## 2018-09-04 ENCOUNTER — Other Ambulatory Visit (HOSPITAL_COMMUNITY)
Admission: RE | Admit: 2018-09-04 | Discharge: 2018-09-04 | Disposition: A | Discharge status: Home / Self Care | Payer: Medicare Other | Source: Ambulatory Visit | Attending: Urology | Admitting: Urology

## 2018-09-04 DIAGNOSIS — Z01818 Encounter for other preprocedural examination: Secondary | ICD-10-CM | POA: Diagnosis not present

## 2018-09-05 LAB — NOVEL CORONAVIRUS, NAA (HOSP ORDER, SEND-OUT TO REF LAB; TAT 18-24 HRS): SARS-CoV-2, NAA: NOT DETECTED

## 2018-09-05 NOTE — Progress Notes (Signed)
SPOKE W/  _patient     SCREENING SYMPTOMS OF COVID 19: denies   COUGH--no  RUNNY NOSE---no   SORE THROAT---no  NASAL CONGESTION----no  SNEEZING----no  SHORTNESS OF BREATH---no  DIFFICULTY BREATHING---no  TEMP >100.0 ---no--  UNEXPLAINED BODY ACHES----nothing new--  CHILLS ---no-----   HEADACHES ------no---  LOSS OF SMELL/ TASTE -----no---    HAVE YOU OR ANY FAMILY MEMBER TRAVELLED PAST 14 DAYS OUT OF THE   COUNTY---no STATE----no COUNTRY----no  HAVE YOU OR ANY FAMILY MEMBER BEEN EXPOSED TO ANYONE WITH COVID 19? denies

## 2018-09-08 ENCOUNTER — Ambulatory Visit (HOSPITAL_COMMUNITY): Payer: Medicare Other | Admitting: Anesthesiology

## 2018-09-08 ENCOUNTER — Ambulatory Visit (HOSPITAL_COMMUNITY): Payer: Medicare Other

## 2018-09-08 ENCOUNTER — Ambulatory Visit (HOSPITAL_COMMUNITY): Payer: Medicare Other | Admitting: Physician Assistant

## 2018-09-08 ENCOUNTER — Encounter (HOSPITAL_COMMUNITY)
Admission: RE | Disposition: A | Discharge status: Home / Self Care | Payer: Self-pay | Source: Home / Self Care | Attending: Urology

## 2018-09-08 ENCOUNTER — Encounter (HOSPITAL_COMMUNITY): Payer: Self-pay | Admitting: Emergency Medicine

## 2018-09-08 ENCOUNTER — Other Ambulatory Visit: Payer: Self-pay

## 2018-09-08 ENCOUNTER — Ambulatory Visit (HOSPITAL_COMMUNITY)
Admission: RE | Admit: 2018-09-08 | Discharge: 2018-09-08 | Disposition: A | Discharge status: Home / Self Care | Payer: Medicare Other | Attending: Urology | Admitting: Urology

## 2018-09-08 DIAGNOSIS — Z96653 Presence of artificial knee joint, bilateral: Secondary | ICD-10-CM | POA: Insufficient documentation

## 2018-09-08 DIAGNOSIS — E1140 Type 2 diabetes mellitus with diabetic neuropathy, unspecified: Secondary | ICD-10-CM | POA: Diagnosis not present

## 2018-09-08 DIAGNOSIS — N2889 Other specified disorders of kidney and ureter: Secondary | ICD-10-CM | POA: Insufficient documentation

## 2018-09-08 DIAGNOSIS — Z7982 Long term (current) use of aspirin: Secondary | ICD-10-CM | POA: Insufficient documentation

## 2018-09-08 DIAGNOSIS — F419 Anxiety disorder, unspecified: Secondary | ICD-10-CM | POA: Diagnosis not present

## 2018-09-08 DIAGNOSIS — K219 Gastro-esophageal reflux disease without esophagitis: Secondary | ICD-10-CM | POA: Diagnosis not present

## 2018-09-08 DIAGNOSIS — Z7984 Long term (current) use of oral hypoglycemic drugs: Secondary | ICD-10-CM | POA: Insufficient documentation

## 2018-09-08 DIAGNOSIS — Z79899 Other long term (current) drug therapy: Secondary | ICD-10-CM | POA: Diagnosis not present

## 2018-09-08 DIAGNOSIS — I10 Essential (primary) hypertension: Secondary | ICD-10-CM | POA: Insufficient documentation

## 2018-09-08 DIAGNOSIS — N132 Hydronephrosis with renal and ureteral calculous obstruction: Secondary | ICD-10-CM | POA: Insufficient documentation

## 2018-09-08 DIAGNOSIS — Z87891 Personal history of nicotine dependence: Secondary | ICD-10-CM | POA: Insufficient documentation

## 2018-09-08 DIAGNOSIS — N35912 Unspecified bulbous urethral stricture, male: Secondary | ICD-10-CM | POA: Diagnosis not present

## 2018-09-08 DIAGNOSIS — G4730 Sleep apnea, unspecified: Secondary | ICD-10-CM | POA: Diagnosis not present

## 2018-09-08 DIAGNOSIS — Z8673 Personal history of transient ischemic attack (TIA), and cerebral infarction without residual deficits: Secondary | ICD-10-CM | POA: Diagnosis not present

## 2018-09-08 DIAGNOSIS — N400 Enlarged prostate without lower urinary tract symptoms: Secondary | ICD-10-CM | POA: Insufficient documentation

## 2018-09-08 DIAGNOSIS — F329 Major depressive disorder, single episode, unspecified: Secondary | ICD-10-CM | POA: Diagnosis not present

## 2018-09-08 HISTORY — PX: CYSTOSCOPY WITH URETEROSCOPY AND STENT PLACEMENT: SHX6377

## 2018-09-08 LAB — GLUCOSE, CAPILLARY
Glucose-Capillary: 106 mg/dL — ABNORMAL HIGH (ref 70–99)
Glucose-Capillary: 119 mg/dL — ABNORMAL HIGH (ref 70–99)

## 2018-09-08 SURGERY — CYSTOURETEROSCOPY, WITH STENT INSERTION
Anesthesia: General | Laterality: Bilateral

## 2018-09-08 MED ORDER — PHENYLEPHRINE HCL (PRESSORS) 10 MG/ML IV SOLN
INTRAVENOUS | Status: DC | PRN
  Administered 2018-09-08 (×2): 80 ug via INTRAVENOUS
  Administered 2018-09-08 (×2): 120 ug via INTRAVENOUS

## 2018-09-08 MED ORDER — LACTATED RINGERS IV SOLN
INTRAVENOUS | Status: DC
  Administered 2018-09-08 (×2): via INTRAVENOUS

## 2018-09-08 MED ORDER — HYDROCODONE-ACETAMINOPHEN 5-325 MG PO TABS: 1.0000 | ORAL_TABLET | ORAL | 0 refills | Status: DC | PRN

## 2018-09-08 MED ORDER — FENTANYL CITRATE (PF) 100 MCG/2ML IJ SOLN
INTRAMUSCULAR | Status: DC | PRN
  Administered 2018-09-08 (×2): 50 ug via INTRAVENOUS

## 2018-09-08 MED ORDER — PROPOFOL 10 MG/ML IV BOLUS
INTRAVENOUS | Status: DC | PRN
  Administered 2018-09-08: 150 mg via INTRAVENOUS

## 2018-09-08 MED ORDER — ONDANSETRON HCL 4 MG/2ML IJ SOLN
INTRAMUSCULAR | Status: DC | PRN
  Administered 2018-09-08: 4 mg via INTRAVENOUS

## 2018-09-08 MED ORDER — IOHEXOL 300 MG/ML  SOLN
INTRAMUSCULAR | Status: DC | PRN
  Administered 2018-09-08: 35 mL

## 2018-09-08 MED ORDER — SODIUM CHLORIDE 0.9 % IR SOLN
Status: DC | PRN
  Administered 2018-09-08 (×2): 3000 mL

## 2018-09-08 MED ORDER — ONDANSETRON HCL 4 MG/2ML IJ SOLN
INTRAMUSCULAR | Status: AC
  Filled 2018-09-08: qty 2

## 2018-09-08 MED ORDER — OXYCODONE HCL 5 MG PO TABS: 5.0000 mg | ORAL_TABLET | Freq: Once | ORAL | Status: DC | PRN

## 2018-09-08 MED ORDER — LIDOCAINE HCL (CARDIAC) PF 100 MG/5ML IV SOSY
PREFILLED_SYRINGE | INTRAVENOUS | Status: DC | PRN
  Administered 2018-09-08: 100 mg via INTRAVENOUS

## 2018-09-08 MED ORDER — OXYCODONE HCL 5 MG/5ML PO SOLN: 5.0000 mg | Freq: Once | ORAL | Status: DC | PRN

## 2018-09-08 MED ORDER — EPHEDRINE SULFATE 50 MG/ML IJ SOLN
INTRAMUSCULAR | Status: DC | PRN
  Administered 2018-09-08 (×2): 10 mg via INTRAVENOUS

## 2018-09-08 MED ORDER — DEXAMETHASONE SODIUM PHOSPHATE 10 MG/ML IJ SOLN
INTRAMUSCULAR | Status: DC | PRN
  Administered 2018-09-08: 10 mg via INTRAVENOUS

## 2018-09-08 MED ORDER — OXYBUTYNIN CHLORIDE 5 MG PO TABS: 5.0000 mg | ORAL_TABLET | Freq: Three times a day (TID) | ORAL | 3 refills | Status: DC

## 2018-09-08 MED ORDER — FENTANYL CITRATE (PF) 100 MCG/2ML IJ SOLN: 25.0000 ug | INTRAMUSCULAR | Status: DC | PRN

## 2018-09-08 MED ORDER — ACETAMINOPHEN 10 MG/ML IV SOLN: 1000.0000 mg | Freq: Once | INTRAVENOUS | Status: DC | PRN

## 2018-09-08 MED ORDER — SUCCINYLCHOLINE CHLORIDE 20 MG/ML IJ SOLN
INTRAMUSCULAR | Status: DC | PRN
  Administered 2018-09-08: 120 mg via INTRAVENOUS

## 2018-09-08 MED ORDER — PROMETHAZINE HCL 25 MG/ML IJ SOLN: 6.2500 mg | INTRAMUSCULAR | Status: DC | PRN

## 2018-09-08 MED ORDER — PROPOFOL 10 MG/ML IV BOLUS
INTRAVENOUS | Status: AC
  Filled 2018-09-08: qty 20

## 2018-09-08 MED ORDER — FENTANYL CITRATE (PF) 100 MCG/2ML IJ SOLN
INTRAMUSCULAR | Status: AC
  Filled 2018-09-08: qty 2

## 2018-09-08 MED ORDER — SODIUM CHLORIDE 0.9 % IV SOLN
INTRAVENOUS | Status: DC | PRN
  Administered 2018-09-08: 50 ug/min via INTRAVENOUS

## 2018-09-08 MED ORDER — CIPROFLOXACIN IN D5W 400 MG/200ML IV SOLN
400.0000 mg | Freq: Two times a day (BID) | INTRAVENOUS | Status: DC
  Administered 2018-09-08: 400 mg via INTRAVENOUS
  Filled 2018-09-08: qty 200

## 2018-09-08 SURGICAL SUPPLY — 34 items
BAG URINE DRAINAGE (UROLOGICAL SUPPLIES) IMPLANT
BAG URO CATCHER STRL LF (MISCELLANEOUS) ×4 IMPLANT
BASKET LASER NITINOL 1.9FR (BASKET) IMPLANT
BASKET ZERO TIP NITINOL 2.4FR (BASKET) IMPLANT
BSKT STON RTRVL 120 1.9FR (BASKET)
BSKT STON RTRVL ZERO TP 2.4FR (BASKET)
CATH FOLEY 2W COUNCIL 5CC 16FR (CATHETERS) ×3 IMPLANT
CATH INTERMIT  6FR 70CM (CATHETERS) IMPLANT
CATH URET 5FR 28IN CONE TIP (BALLOONS)
CATH URET 5FR 70CM CONE TIP (BALLOONS) IMPLANT
CATH URET DUAL LUMEN 6-10FR 50 (CATHETERS) ×3 IMPLANT
CLOTH BEACON ORANGE TIMEOUT ST (SAFETY) ×4 IMPLANT
COVER WAND RF STERILE (DRAPES) IMPLANT
ELECT REM PT RETURN 15FT ADLT (MISCELLANEOUS) ×4 IMPLANT
EXTRACTOR STONE 1.7FRX115CM (UROLOGICAL SUPPLIES) IMPLANT
FIBER LASER FLEXIVA 365 (UROLOGICAL SUPPLIES) IMPLANT
FIBER LASER TRAC TIP (UROLOGICAL SUPPLIES) ×3 IMPLANT
GLOVE BIO SURGEON STRL SZ7.5 (GLOVE) ×4 IMPLANT
GOWN STRL REUS W/TWL LRG LVL3 (GOWN DISPOSABLE) ×4 IMPLANT
GOWN STRL REUS W/TWL XL LVL3 (GOWN DISPOSABLE) ×4 IMPLANT
GUIDEWIRE ANG ZIPWIRE 038X150 (WIRE) IMPLANT
GUIDEWIRE STR DUAL SENSOR (WIRE) ×7 IMPLANT
KIT TURNOVER KIT A (KITS) IMPLANT
LOOP CUT BIPOLAR 24F LRG (ELECTROSURGICAL) IMPLANT
MANIFOLD NEPTUNE II (INSTRUMENTS) ×4 IMPLANT
PACK CYSTO (CUSTOM PROCEDURE TRAY) ×4 IMPLANT
SET ASPIRATION TUBING (TUBING) IMPLANT
SHEATH URETERAL 12FRX28CM (UROLOGICAL SUPPLIES) IMPLANT
SHEATH URETERAL 12FRX35CM (MISCELLANEOUS) ×3 IMPLANT
STENT URET 6FRX26 CONTOUR (STENTS) ×3 IMPLANT
SYRINGE IRR TOOMEY STRL 70CC (SYRINGE) IMPLANT
TUBING CONNECTING 10 (TUBING) ×3 IMPLANT
TUBING CONNECTING 10' (TUBING) ×1
TUBING UROLOGY SET (TUBING) ×4 IMPLANT

## 2018-09-08 NOTE — Anesthesia Procedure Notes (Signed)
Procedure Name: Intubation Date/Time: 09/08/2018 11:35 AM Performed by: Glory Buff, CRNA Pre-anesthesia Checklist: Patient identified, Emergency Drugs available, Suction available and Patient being monitored Patient Re-evaluated:Patient Re-evaluated prior to induction Oxygen Delivery Method: Circle system utilized Preoxygenation: Pre-oxygenation with 100% oxygen Induction Type: IV induction Ventilation: Mask ventilation without difficulty Laryngoscope Size: Miller and 3 Grade View: Grade I Tube type: Oral Tube size: 7.5 mm Number of attempts: 1 Airway Equipment and Method: Stylet and Oral airway Placement Confirmation: ETT inserted through vocal cords under direct vision,  positive ETCO2 and breath sounds checked- equal and bilateral Secured at: 22 cm Tube secured with: Tape Dental Injury: Teeth and Oropharynx as per pre-operative assessment

## 2018-09-08 NOTE — Anesthesia Postprocedure Evaluation (Signed)
Anesthesia Post Note  Patient: Jason [name]  Procedure(s) Performed: CYSTOSCOPY WITH BILATERAL  URETEROSCOPY HOLMIUM LASER AND STENT PLACEMENT (Bilateral )     Patient location during evaluation: PACU Anesthesia Type: General Level of consciousness: awake and alert Pain management: pain level controlled Vital Signs Assessment: post-procedure vital signs reviewed and stable Respiratory status: spontaneous breathing, nonlabored ventilation and respiratory function stable Cardiovascular status: blood pressure returned to baseline and stable Postop Assessment: no apparent nausea or vomiting Anesthetic complications: no    Last Vitals:  Vitals:   09/08/18 1315 09/08/18 1358  BP: 117/76 117/76  Pulse: 78 78  Resp: 14 15  Temp: 36.9 C 36.9 C  SpO2: 94% 98%    Last Pain:  Vitals:   09/08/18 1315  TempSrc:   PainSc: 0-No pain                 Brennan Alkhatib

## 2018-09-08 NOTE — H&P (Signed)
CC/HPI: Cc: Left ureteral calculus, right renal mass.  HPI:  08/14/2018  Patient presented to the emergency department with severe left-sided flank pain. This was on 08/10/2018. He underwent a CT of the abdomen and pelvis without contrast. This revealed a 6 mm ureterovesicular junction calculus. The stone was visible on scout imaging. However, today on KUB, he has prominent colon and I did not visualize the stone. However, he continues with 8 out of 10 flank pain. He denies any fever. Occasional nausea and decreased appetite but no vomiting.   He was also incidentally found to have a centralized right renal mass. This was incompletely characterized. He denies any gross hematuria. Urinalysis shows persistent microscopic hematuria.   08/21/2018  In interval, the patient has gone to the emergency department for severe left-sided flank pain. He recently underwent a CT with and without contrast. This revealed persistence of the left distal ureteral calculus with upstream hydronephrosis. This also revealed an approximately 5 cm centralized enhancing right renal mass. In the interval, he passed several stones. His pain is resolved. He is now having some mild right sided flank pain. 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 Has not smoked since 07/31/1988.   Tobacco Use Assessment Completed: Used Tobacco in last 30 days? Drinks 1 caffeinated drink per day.    REVIEW OF SYSTEMS:    GU Review Male:   Patient denies frequent urination, hard to postpone urination, burning/ pain with urination, get up at night to urinate, leakage of urine, stream starts and stops, trouble starting your stream, have to strain to urinate , erection problems, and penile pain.  Gastrointestinal (Upper):   Patient denies nausea, vomiting, and indigestion/ heartburn.  Gastrointestinal (Lower):   Patient denies diarrhea and constipation.  Constitutional:   Patient denies weight loss, fatigue, night sweats, and fever.  Skin:   Patient denies skin rash/ lesion and itching.  Eyes:   Patient denies blurred vision and double vision.  Ears/ Nose/ Throat:   Patient denies sore throat and sinus problems.  Hematologic/Lymphatic:   Patient denies swollen glands and easy bruising.  Cardiovascular:   Patient denies leg swelling and chest pains.  Respiratory:   Patient denies cough and shortness of breath.  Endocrine:   Patient denies excessive thirst.  Musculoskeletal:   Patient reports back pain. Patient denies joint pain.  Neurological:   Patient denies headaches and dizziness.  Psychologic:   Patient denies depression and anxiety.   Notes: passed stone this morning R sided pain     VITAL SIGNS:      08/21/2018 08:17 AM  BP 132/87 mmHg  Heart Rate 78 /min  Temperature 98.0 F / 36.6 C   MULTI-SYSTEM PHYSICAL EXAMINATION:    Constitutional: Well-nourished. No physical deformities. Normally developed. Good grooming. Appears uncomfortable secondary to discomfort  Respiratory: No labored breathing, no use of accessory muscles.   Cardiovascular: Normal  temperature, adequate perfusion of extremities  Skin: No paleness, no jaundice  Neurologic / Psychiatric: Oriented to time, oriented to place, oriented to person. No depression, no anxiety, no agitation.  Gastrointestinal: No mass, no tenderness, no rigidity, non obese abdomen.  Eyes: Normal conjunctivae. Normal eyelids.  Musculoskeletal: Normal gait and station of head and neck.     PAST DATA REVIEWED:  Source Of History:  Patient  X-Ray Review: C.T. Abdomen/Pelvis: Reviewed Films. Reviewed Report. Discussed With Patient.     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  Hydrocodone-Acetaminophen 5 mg-325 mg tablet 1 tablet PO Q 6 H PRN pain Start: 08/20/2018  Stop: 08/24/2018   Discontinue: 08/21/2018  - Reason: The medication cycle was completed.            Orders Labs Urine Culture          Document Letter(s):  Created for Patient: Clinical Summary         Notes:   Recommend proceeding to the operating room for cystoscopy, bilateral ureteroscopy, possible laser lithotripsy of the left ureteral stone, possible biopsy of the right  renal mass, possible bilateral ureteral stent placement. He understands potential risks including but not limited to bleeding, infection, injury to surrounding structures, need for additional procedures.   Cc: Dr. Woody Seller, M.D.    Signed by Link Snuffer, III, M.D. on 08/21/18 at 8:45 AM (EDT

## 2018-09-08 NOTE — Op Note (Signed)
Operative Note  Preoperative diagnosis:  1.  Left ureteral calculus and right renal calculus 2.  Right renal mass  Postoperative diagnosis: 1.  Left ureteral calculus that had passed and right renal calculus 2.  Right renal mass 3.  Bulbar urethral stricture  Procedure(s): 1.  Cystoscopy, urethral dilation of urethral stricture, left retrograde pyelogram, left diagnostic ureteroscopy with stent placement, right retrograde pyelogram, right ureteroscopy with laser lithotripsy and ureteral stent placement  Surgeon: Link Snuffer, MD  Assistants: None  Anesthesia: General  Complications: None immediate  EBL: Minimal  Specimens: 1.  None  Drains/Catheters: 1.  Bilateral 6 x 26 double-J ureteral stents 2.  18 French council tip catheter  Intraoperative findings: 1.  Anterior urethra with a short, less than 1 cm bulbar urethral stricture that was dilated over a wire with the scope.  Prostate was mildly obstructing.  Bladder was normal.  Left retrograde pyelogram revealed a well opacified kidney with no filling defects.  Diagnostic ureteroscopy revealed no obvious stones.  Right retrograde pyelogram revealed no obvious filling defects.  No obvious mass within the collecting system.  Right ureteroscopy revealed an 8 mm renal calculus that was fragmented to tiny fragments.  No renal mass was seen in the collecting system.  Indication: 73 year old male who was experiencing flank pain found to have a distal left ureteral calculus as well as an incidental finding of a right renal mass.  Dedicated renal imaging revealed persistence of the stone as well as an enhancing renal mass concerning for malignancy.  Therefore, he presents for the previously mentioned operation.  Description of procedure:  The patient was identified and consent was obtained.  The patient was taken to the operating room and placed in the supine position.  The patient was placed under general anesthesia.  Perioperative  antibiotics were administered.  The patient was placed in dorsal lithotomy.  Patient was prepped and draped in a standard sterile fashion and a timeout was performed.  A 21 French rigid cystoscope was advanced into the urethra.  I encountered a urethral stricture and therefore passed a sensor wire through this and into the bladder.  I was then able to carefully advanced the scope over the wire to passively dilate this stricture.  I then systematically inspected the bladder and there were no masses.  I advanced a sensor wire up the left kidney under fluoroscopic guidance.  I advanced a semirigid ureteroscope alongside the wire up to the renal pelvis and no stones were seen.  I shot a retrograde pyelogram through the scope with the findings noted above.  I then carefully withdrew the scope and no stone fragments were seen again.  I backloaded the wire onto a rigid cystoscope and advanced that into the bladder followed by routine placement of a 6 x 26 double-J ureteral stent.  I then advanced a wire up the right ureter and into the kidney under fluoroscopic guidance.  A dual-lumen ureteral catheter was advanced over the wire under continuous fluoroscopic guidance into the distal ureter.  Retrograde pyelogram was performed with the findings noted above.  I then advanced a second wire through the dual-lumen up to the kidney under fluoroscopic guidance.  I withdrew the dual-lumen and secured 1 of the wires to the drape as a safety wire.  The other wire was used to carefully advanced a 12 x 14 ureteral access sheath over the wire under continuous fluoroscopic guidance up the ureter.  Flexible ureteroscopy was performed and no concerns for malignancy were seen  within the collecting system.  I turned my attention to the stone and fragmented it to tiny fragments.  No other stone fragments were seen.  I reinspected the entire kidney and again no malignancy was seen.  The collecting system was otherwise normal.  I therefore  withdrew the scope along with the access sheath visualizing the ureter upon removal.  No stones were visualized and no significant trauma was seen.  I then backloaded the wire onto a rigid cystoscope which was advanced into the bladder followed by routine placement of a 6 x 26 double-J ureteral stent in a standard fashion followed by removal of the wire.  Fluoroscopy confirmed proximal placement and direct visualization confirmed a good coil within the bladder.  I then readvanced the wire into the bladder and withdrew the scope followed by advancement of an 61 [place] council tip catheter over the wire and into the bladder to allow for urethral healing.  The wire was withdrawn and 10 cc of sterile water was instilled into the catheter balloon.  This concluded the operation.  The patient tolerated procedure well was stable postoperatively.  Plan: Follow-up in 1 week for stent removal and catheter removal

## 2018-09-08 NOTE — Transfer of Care (Signed)
Immediate Anesthesia Transfer of Care Note  Patient: Jason [name]  Procedure(s) Performed: CYSTOSCOPY WITH BILATERAL  URETEROSCOPY HOLMIUM LASER AND STENT PLACEMENT (Bilateral )  Patient Location: PACU  Anesthesia Type:General  Level of Consciousness: sedated, patient cooperative and responds to stimulation  Airway & Oxygen Therapy: Patient Spontanous Breathing and Patient connected to face mask oxygen  Post-op Assessment: Report given to RN and Post -op Vital signs reviewed and stable  Post vital signs: Reviewed and stable  Last Vitals:  Vitals Value Taken Time  BP 128/77 09/08/2018 12:35 PM  Temp    Pulse 79 09/08/2018 12:36 PM  Resp 17 09/08/2018 12:36 PM  SpO2 98 % 09/08/2018 12:36 PM  Vitals shown include unvalidated device data.  Last Pain:  Vitals:   09/08/18 1100  TempSrc:   PainSc: 2       Patients Stated Pain Goal: 4 (16/10/96 0454)  Complications: No apparent anesthesia complications

## 2018-09-08 NOTE — Discharge Instructions (Signed)

## 2018-09-08 NOTE — Anesthesia Preprocedure Evaluation (Addendum)
Anesthesia Evaluation  Patient identified by MRN, date of birth, ID band Patient awake    Reviewed: Allergy & Precautions, NPO status , Patient's Chart, lab work & pertinent test results, reviewed documented beta blocker date and time   History of Anesthesia Complications Negative for: history of anesthetic complications  Airway Mallampati: II  TM Distance: >3 FB Neck ROM: Full    Dental  (+) Missing,    Pulmonary sleep apnea , former smoker,    Pulmonary exam normal        Cardiovascular hypertension, Pt. on home beta blockers and Pt. on medications Normal cardiovascular exam  TTE 2013: EF 63%, no significant valvular abnormalities   Neuro/Psych Depression RLS TIA   GI/Hepatic Neg liver ROS, GERD  Medicated and Controlled,  Endo/Other  diabetes, Type 2, Oral Hypoglycemic Agents  Renal/GU negative Renal ROS     Musculoskeletal  (+) Arthritis ,   Abdominal   Peds  Hematology negative hematology ROS (+)   Anesthesia Other Findings Day of surgery medications reviewed with the patient.  Reproductive/Obstetrics                           Anesthesia Physical Anesthesia Plan  ASA: III  Anesthesia Plan: General   Post-op Pain Management:    Induction: Intravenous  PONV Risk Score and Plan: 3 and Treatment may vary due to age or medical condition, Ondansetron and Dexamethasone  Airway Management Planned: Oral ETT  Additional Equipment:   Intra-op Plan:   Post-operative Plan: Extubation in OR  Informed Consent: I have reviewed the patients History and Physical, chart, labs and discussed the procedure including the risks, benefits and alternatives for the proposed anesthesia with the patient or authorized representative who has indicated his/her understanding and acceptance.     Dental advisory given  Plan Discussed with: CRNA  Anesthesia Plan Comments:       Anesthesia Quick  Evaluation

## 2018-09-09 ENCOUNTER — Encounter (HOSPITAL_COMMUNITY): Payer: Self-pay | Admitting: Urology

## 2018-09-19 ENCOUNTER — Other Ambulatory Visit: Payer: Self-pay | Admitting: Urology

## 2018-10-08 NOTE — Progress Notes (Signed)
[facility] 05-29-18 Epic EKG 09-03-18 Epic HEMAGLOBIN A1C 09-03-18 EPIC

## 2018-10-08 NOTE — Patient Instructions (Addendum)
YOU NEED TO HAVE A COVID 19 TEST ON Saturday, July 11th @ 9:00 AM, THIS TEST MUST BE DONE BEFORE SURGERY, COME TO [facility] ENTRANCE. ONCE YOUR COVID TEST IS COMPLETED, PLEASE BEGIN THE QUARANTINE INSTRUCTIONS AS OUTLINED IN YOUR HANDOUT.                Jason [name]    Your procedure is scheduled on: 10-15-2018   Report to [facility] Main  Entrance    Report to [facility] at 6:30 AM      Call this number if you have problems the morning of surgery [phone]    Remember: Do not eat food or drink liquids :After Midnight. BRUSH YOUR TEETH MORNING OF SURGERY AND RINSE YOUR MOUTH OUT, NO CHEWING GUM CANDY OR MINTS.     Take these medicines the morning of surgery with A SIP OF WATER: METOPROLOL, DULOXETINE, TEGRETOL,PRTONIX, TAMSULOSIN, GABAPENTIN                  How to Manage Your Diabetes Before and After Surgery  Why is it important to control my blood sugar before and after surgery? . Improving blood sugar levels before and after surgery helps healing and can limit problems. . A way of improving blood sugar control is eating a healthy diet by: o  Eating less sugar and carbohydrates o  Increasing activity/exercise o  Talking with your doctor about reaching your blood sugar goals . High blood sugars (greater than 180 mg/dL) can raise your risk of infections and slow your recovery, so you will need to focus on controlling your diabetes during the weeks before surgery. . Make sure that the doctor who takes care of your diabetes knows about your planned surgery including the date and location.  How do I manage my blood sugar before surgery? . Check your blood sugar at least 4 times a day, starting 2 days before surgery, to make sure that the level is not too high or low. o Check your blood sugar the morning of your surgery when you wake up and every 2 hours until you get to the Short Stay unit. . If your blood sugar is less than 70 mg/dL,  you will need to treat for low blood sugar: o Do not take insulin. o Treat a low blood sugar (less than 70 mg/dL) with  cup of clear juice (cranberry or apple), 4 glucose tablets, OR glucose gel. o Recheck blood sugar in 15 minutes after treatment (to make sure it is greater than 70 mg/dL). If your blood sugar is not greater than 70 mg/dL on recheck, call [phone] for further instructions. . Report your blood sugar to the short stay nurse when you get to Short Stay.  . If you are admitted to the hospital after surgery: o Your blood sugar will be checked by the staff and you will probably be given insulin after surgery (instead of oral diabetes medicines) to make sure you have good blood sugar levels. o The goal for blood sugar control after surgery is 80-180 mg/dL.   WHAT DO I DO ABOUT MY DIABETES MEDICATION?   . THE DAY BEFORE SURGERY TAKE METFORMIN AS USUAL.       . THE MORNING OF SURGERY DO NOT TAKE METFORMIN.    Reviewed and Endorsed by [facility] Patient Education Committee, August 2015                  You may not have  any metal on your body including hair pins and              piercings  Do not wear jewelry, make-up, lotions, powders or perfumes, deodorant                        Men may shave face and neck.   Do not bring valuables to the hospital. [place].  Contacts, dentures or bridgework may not be worn into surgery.  Leave suitcase in the car. After surgery it may be brought to your room.      _____________________________________________________________________             [facility] - Preparing for Surgery Before surgery, you can play an important role.  Because skin is not sterile, your skin needs to be as free of germs as possible.  You can reduce the number of germs on your skin by washing with CHG (chlorahexidine gluconate) soap before surgery.  CHG is an antiseptic cleaner which kills germs and bonds  with the skin to continue killing germs even after washing. Please DO NOT use if you have an allergy to CHG or antibacterial soaps.  If your skin becomes reddened/irritated stop using the CHG and inform your nurse when you arrive at Short Stay. Do not shave (including legs and underarms) for at least 48 hours prior to the first CHG shower.  You may shave your face/neck. Please follow these instructions carefully:  1.  Shower with CHG Soap the night before surgery and the  morning of Surgery.  2.  If you choose to wash your hair, wash your hair first as usual with your  normal  shampoo.  3.  After you shampoo, rinse your hair and body thoroughly to remove the  shampoo.                           4.  Use CHG as you would any other liquid soap.  You can apply chg directly  to the skin and wash                       Gently with a scrungie or clean washcloth.  5.  Apply the CHG Soap to your body ONLY FROM THE NECK DOWN.   Do not use on face/ open                           Wound or open sores. Avoid contact with eyes, ears mouth and genitals (private parts).                       Wash face,  Genitals (private parts) with your normal soap.             6.  Wash thoroughly, paying special attention to the area where your surgery  will be performed.  7.  Thoroughly rinse your body with warm water from the neck down.  8.  DO NOT shower/wash with your normal soap after using and rinsing off  the CHG Soap.                9.  Pat yourself dry with a clean towel.            10.  Wear clean pajamas.            11.  Place clean sheets on your bed the night of your first shower and do not  sleep with pets. Day of Surgery : Do not apply any lotions/deodorants the morning of surgery.  Please wear clean clothes to the hospital/surgery center.  FAILURE TO FOLLOW THESE INSTRUCTIONS MAY RESULT IN THE CANCELLATION OF YOUR SURGERY PATIENT SIGNATURE_________________________________  NURSE  SIGNATURE__________________________________  ________________________________________________________________________  WHAT IS A BLOOD TRANSFUSION? Blood Transfusion Information  A transfusion is the replacement of blood or some of its parts. Blood is made up of multiple cells which provide different functions.  Red blood cells carry oxygen and are used for blood loss replacement.  White blood cells fight against infection.  Platelets control bleeding.  Plasma helps clot blood.  Other blood products are available for specialized needs, such as hemophilia or other clotting disorders. BEFORE THE TRANSFUSION  Who gives blood for transfusions?   Healthy volunteers who are fully evaluated to make sure their blood is safe. This is blood bank blood. Transfusion therapy is the safest it has ever been in the practice of medicine. Before blood is taken from a donor, a complete history is taken to make sure that person has no history of diseases nor engages in risky social behavior (examples are intravenous drug use or sexual activity with multiple partners). The donor's travel history is screened to minimize risk of transmitting infections, such as malaria. The donated blood is tested for signs of infectious diseases, such as HIV and hepatitis. The blood is then tested to be sure it is compatible with you in order to minimize the chance of a transfusion reaction. If you or a relative donates blood, this is often done in anticipation of surgery and is not appropriate for emergency situations. It takes many days to process the donated blood. RISKS AND COMPLICATIONS Although transfusion therapy is very safe and saves many lives, the main dangers of transfusion include:   Getting an infectious disease.  Developing a transfusion reaction. This is an allergic reaction to something in the blood you were given. Every precaution is taken to prevent this. The decision to have a blood transfusion has been  considered carefully by your caregiver before blood is given. Blood is not given unless the benefits outweigh the risks. AFTER THE TRANSFUSION  Right after receiving a blood transfusion, you will usually feel much better and more energetic. This is especially true if your red blood cells have gotten low (anemic). The transfusion raises the level of the red blood cells which carry oxygen, and this usually causes an energy increase.  The nurse administering the transfusion will monitor you carefully for complications. HOME CARE INSTRUCTIONS  No special instructions are needed after a transfusion. You may find your energy is better. Speak with your caregiver about any limitations on activity for underlying diseases you may have. SEEK MEDICAL CARE IF:   Your condition is not improving after your transfusion.  You develop redness or irritation at the intravenous (IV) site. SEEK IMMEDIATE MEDICAL CARE IF:  Any of the following symptoms occur over the next 12 hours:  Shaking chills.  You have a temperature by mouth above 102 F (38.9 C), not controlled by medicine.  Chest, back, or muscle pain.  People around you feel you are not acting correctly or are confused.  Shortness of breath or difficulty breathing.  Dizziness and fainting.  You get a rash or develop hives.  You have a decrease in urine output.  Your urine turns a dark color or changes to pink, red, or brown. Any of the following symptoms occur over the next 10 days:  You have a temperature by mouth above 102 F (38.9 C), not controlled by medicine.  Shortness of breath.  Weakness after normal activity.  The white part of the eye turns yellow (jaundice).  You have a decrease in the amount of urine or are urinating less often.  Your urine turns a dark color or changes to pink, red, or brown. Document Released: 03/16/2000 Document Revised: 06/11/2011 Document Reviewed: 11/03/2007 [facility] Patient Information 2014  [place], [place].  _______________________________________________________________________

## 2018-10-10 ENCOUNTER — Encounter (HOSPITAL_COMMUNITY): Payer: Self-pay

## 2018-10-10 ENCOUNTER — Encounter (HOSPITAL_COMMUNITY)
Admission: RE | Admit: 2018-10-10 | Discharge: 2018-10-10 | Disposition: A | Discharge status: Home / Self Care | Payer: Medicare Other | Source: Ambulatory Visit | Attending: Urology | Admitting: Urology

## 2018-10-10 ENCOUNTER — Other Ambulatory Visit: Payer: Self-pay

## 2018-10-10 DIAGNOSIS — Z01812 Encounter for preprocedural laboratory examination: Secondary | ICD-10-CM | POA: Diagnosis not present

## 2018-10-10 DIAGNOSIS — Z1159 Encounter for screening for other viral diseases: Secondary | ICD-10-CM | POA: Diagnosis not present

## 2018-10-10 LAB — BASIC METABOLIC PANEL
Anion gap: 10 (ref 5–15)
BUN: 13 mg/dL (ref 8–23)
CO2: 26 mmol/L (ref 22–32)
Calcium: 8.9 mg/dL (ref 8.9–10.3)
Chloride: 99 mmol/L (ref 98–111)
Creatinine, Ser: 0.75 mg/dL (ref 0.61–1.24)
GFR calc Af Amer: >60 mL/min (ref >60)
GFR calc non Af Amer: >60 mL/min (ref >60)
Glucose, Bld: 117 mg/dL — ABNORMAL HIGH (ref 70–99)
Potassium: 4.5 mmol/L (ref 3.5–5.1)
Sodium: 135 mmol/L (ref 135–145)

## 2018-10-10 LAB — CBC
HCT: 39.5 % (ref 39.0–52.0)
Hemoglobin: 13.1 g/dL (ref 13.0–17.0)
MCH: 32 pg (ref 26.0–34.0)
MCHC: 33.2 g/dL (ref 30.0–36.0)
MCV: 96.3 fL (ref 80.0–100.0)
Platelets: 215 10*3/uL (ref 150–400)
RBC: 4.1 MIL/uL — ABNORMAL LOW (ref 4.22–5.81)
RDW: 13.8 % (ref 11.5–15.5)
WBC: 6.2 10*3/uL (ref 4.0–10.5)
nRBC: 0 % (ref 0.0–0.2)

## 2018-10-10 LAB — PROTIME-INR
INR: 1.1 (ref 0.8–1.2)
Prothrombin Time: 13.8 seconds (ref 11.4–15.2)

## 2018-10-10 LAB — SURGICAL PCR SCREEN
MRSA, PCR: POSITIVE — AB
Staphylococcus aureus: POSITIVE — AB

## 2018-10-10 LAB — ABO/RH: ABO/RH(D): O POS

## 2018-10-10 LAB — GLUCOSE, CAPILLARY: Glucose-Capillary: 115 mg/dL — ABNORMAL HIGH (ref 70–99)

## 2018-10-11 ENCOUNTER — Other Ambulatory Visit (HOSPITAL_COMMUNITY)
Admission: RE | Admit: 2018-10-11 | Discharge: 2018-10-11 | Disposition: A | Discharge status: Home / Self Care | Payer: Medicare Other | Source: Ambulatory Visit | Attending: Urology | Admitting: Urology

## 2018-10-11 DIAGNOSIS — Z01812 Encounter for preprocedural laboratory examination: Secondary | ICD-10-CM | POA: Diagnosis not present

## 2018-10-11 LAB — SARS CORONAVIRUS 2 (TAT 6-24 HRS): SARS Coronavirus 2: NEGATIVE

## 2018-10-15 ENCOUNTER — Other Ambulatory Visit: Payer: Self-pay

## 2018-10-15 ENCOUNTER — Inpatient Hospital Stay (HOSPITAL_COMMUNITY)
Admission: RE | Admit: 2018-10-15 | Discharge: 2018-10-17 | DRG: 658 | Disposition: A | Discharge status: Home / Self Care | Payer: Medicare Other | Attending: Urology | Admitting: Urology

## 2018-10-15 ENCOUNTER — Encounter (HOSPITAL_COMMUNITY): Payer: Self-pay | Admitting: Certified Registered Nurse Anesthetist

## 2018-10-15 ENCOUNTER — Inpatient Hospital Stay (HOSPITAL_COMMUNITY): Payer: Medicare Other | Admitting: Certified Registered Nurse Anesthetist

## 2018-10-15 ENCOUNTER — Inpatient Hospital Stay (HOSPITAL_COMMUNITY): Payer: Medicare Other | Admitting: Physician Assistant

## 2018-10-15 ENCOUNTER — Encounter (HOSPITAL_COMMUNITY)
Admission: RE | Disposition: A | Discharge status: Home / Self Care | Payer: Self-pay | Source: Home / Self Care | Attending: Urology

## 2018-10-15 DIAGNOSIS — Z87442 Personal history of urinary calculi: Secondary | ICD-10-CM | POA: Diagnosis not present

## 2018-10-15 DIAGNOSIS — K219 Gastro-esophageal reflux disease without esophagitis: Secondary | ICD-10-CM | POA: Diagnosis present

## 2018-10-15 DIAGNOSIS — Z8249 Family history of ischemic heart disease and other diseases of the circulatory system: Secondary | ICD-10-CM | POA: Diagnosis not present

## 2018-10-15 DIAGNOSIS — I10 Essential (primary) hypertension: Secondary | ICD-10-CM | POA: Diagnosis present

## 2018-10-15 DIAGNOSIS — F419 Anxiety disorder, unspecified: Secondary | ICD-10-CM | POA: Diagnosis present

## 2018-10-15 DIAGNOSIS — Z96653 Presence of artificial knee joint, bilateral: Secondary | ICD-10-CM | POA: Diagnosis present

## 2018-10-15 DIAGNOSIS — E119 Type 2 diabetes mellitus without complications: Secondary | ICD-10-CM | POA: Diagnosis present

## 2018-10-15 DIAGNOSIS — G629 Polyneuropathy, unspecified: Secondary | ICD-10-CM | POA: Diagnosis present

## 2018-10-15 DIAGNOSIS — Z87891 Personal history of nicotine dependence: Secondary | ICD-10-CM | POA: Diagnosis not present

## 2018-10-15 DIAGNOSIS — G4730 Sleep apnea, unspecified: Secondary | ICD-10-CM | POA: Diagnosis present

## 2018-10-15 DIAGNOSIS — C641 Malignant neoplasm of right kidney, except renal pelvis: Principal | ICD-10-CM | POA: Diagnosis present

## 2018-10-15 DIAGNOSIS — M1990 Unspecified osteoarthritis, unspecified site: Secondary | ICD-10-CM | POA: Diagnosis present

## 2018-10-15 DIAGNOSIS — N2889 Other specified disorders of kidney and ureter: Secondary | ICD-10-CM | POA: Diagnosis present

## 2018-10-15 DIAGNOSIS — Z7984 Long term (current) use of oral hypoglycemic drugs: Secondary | ICD-10-CM | POA: Diagnosis not present

## 2018-10-15 HISTORY — PX: LAPAROSCOPIC NEPHRECTOMY, HAND ASSISTED: SHX1929

## 2018-10-15 LAB — GLUCOSE, CAPILLARY
Glucose-Capillary: 114 mg/dL — ABNORMAL HIGH (ref 70–99)
Glucose-Capillary: 180 mg/dL — ABNORMAL HIGH (ref 70–99)
Glucose-Capillary: 158 mg/dL — ABNORMAL HIGH (ref 70–99)
Glucose-Capillary: 132 mg/dL — ABNORMAL HIGH (ref 70–99)

## 2018-10-15 LAB — TYPE AND SCREEN
ABO/RH(D): O POS
Antibody Screen: NEGATIVE

## 2018-10-15 LAB — HEMOGLOBIN AND HEMATOCRIT, BLOOD
HCT: 40.6 % (ref 39.0–52.0)
Hemoglobin: 12.9 g/dL — ABNORMAL LOW (ref 13.0–17.0)

## 2018-10-15 SURGERY — NEPHRECTOMY, HAND-ASSISTED, LAPAROSCOPIC
Anesthesia: General | Site: Abdomen | Laterality: Right

## 2018-10-15 MED ORDER — OXYCODONE HCL 5 MG PO TABS
5.0000 mg | ORAL_TABLET | ORAL | Status: DC | PRN
  Administered 2018-10-15 – 2018-10-17 (×5): 5 mg via ORAL
  Filled 2018-10-15 (×6): qty 1

## 2018-10-15 MED ORDER — 0.9 % SODIUM CHLORIDE (POUR BTL) OPTIME
TOPICAL | Status: DC | PRN
  Administered 2018-10-15: 10:00:00 1000 mL

## 2018-10-15 MED ORDER — ROCURONIUM BROMIDE 10 MG/ML (PF) SYRINGE
PREFILLED_SYRINGE | INTRAVENOUS | Status: DC | PRN
  Administered 2018-10-15: 50 mg via INTRAVENOUS
  Administered 2018-10-15: 20 mg via INTRAVENOUS

## 2018-10-15 MED ORDER — ROPINIROLE HCL 1 MG PO TABS
4.0000 mg | ORAL_TABLET | Freq: Every day | ORAL | Status: DC
  Administered 2018-10-15 – 2018-10-16 (×2): 4 mg via ORAL
  Filled 2018-10-15 (×2): qty 4

## 2018-10-15 MED ORDER — CLINDAMYCIN PHOSPHATE 900 MG/50ML IV SOLN
900.0000 mg | Freq: Three times a day (TID) | INTRAVENOUS | Status: AC
  Administered 2018-10-15 – 2018-10-16 (×3): 900 mg via INTRAVENOUS
  Filled 2018-10-15 (×3): qty 50

## 2018-10-15 MED ORDER — SODIUM CHLORIDE 0.9% FLUSH
INTRAVENOUS | Status: DC | PRN
  Administered 2018-10-15: 20 mL via INTRAVENOUS

## 2018-10-15 MED ORDER — FENTANYL CITRATE (PF) 100 MCG/2ML IJ SOLN
INTRAMUSCULAR | Status: AC
  Administered 2018-10-15: 50 ug via INTRAVENOUS
  Filled 2018-10-15: qty 2

## 2018-10-15 MED ORDER — LIDOCAINE 2% (20 MG/ML) 5 ML SYRINGE
INTRAMUSCULAR | Status: DC | PRN
  Administered 2018-10-15: 6 mg via INTRAVENOUS

## 2018-10-15 MED ORDER — INSULIN ASPART 100 UNIT/ML ~~LOC~~ SOLN
0.0000 [IU] | Freq: Three times a day (TID) | SUBCUTANEOUS | Status: DC
  Administered 2018-10-15: 3 [IU] via SUBCUTANEOUS
  Administered 2018-10-16: 2 [IU] via SUBCUTANEOUS

## 2018-10-15 MED ORDER — ACETAMINOPHEN 500 MG PO TABS
1000.0000 mg | ORAL_TABLET | Freq: Four times a day (QID) | ORAL | Status: AC
  Administered 2018-10-15 – 2018-10-16 (×3): 1000 mg via ORAL
  Filled 2018-10-15 (×4): qty 2

## 2018-10-15 MED ORDER — PROPOFOL 10 MG/ML IV BOLUS
INTRAVENOUS | Status: DC | PRN
  Administered 2018-10-15: 160 mg via INTRAVENOUS

## 2018-10-15 MED ORDER — SODIUM CHLORIDE (PF) 0.9 % IJ SOLN
INTRAMUSCULAR | Status: AC
  Filled 2018-10-15: qty 20

## 2018-10-15 MED ORDER — FENTANYL CITRATE (PF) 100 MCG/2ML IJ SOLN
INTRAMUSCULAR | Status: DC | PRN
  Administered 2018-10-15: 100 ug via INTRAVENOUS

## 2018-10-15 MED ORDER — CARBAMAZEPINE 200 MG PO TABS
100.0000 mg | ORAL_TABLET | Freq: Two times a day (BID) | ORAL | Status: DC
  Administered 2018-10-15 – 2018-10-17 (×4): 100 mg via ORAL
  Filled 2018-10-15 (×4): qty 0.5

## 2018-10-15 MED ORDER — FENTANYL CITRATE (PF) 100 MCG/2ML IJ SOLN
25.0000 ug | INTRAMUSCULAR | Status: DC | PRN
  Administered 2018-10-15 (×3): 50 ug via INTRAVENOUS

## 2018-10-15 MED ORDER — METOPROLOL TARTRATE 25 MG PO TABS
25.0000 mg | ORAL_TABLET | Freq: Every day | ORAL | Status: DC
  Administered 2018-10-15 – 2018-10-16 (×2): 25 mg via ORAL
  Filled 2018-10-15 (×2): qty 1

## 2018-10-15 MED ORDER — BUPIVACAINE LIPOSOME 1.3 % IJ SUSP
20.0000 mL | Freq: Once | INTRAMUSCULAR | Status: DC
  Filled 2018-10-15: qty 20

## 2018-10-15 MED ORDER — LACTATED RINGERS IV SOLN
INTRAVENOUS | Status: DC
  Administered 2018-10-15 – 2018-10-16 (×2): via INTRAVENOUS

## 2018-10-15 MED ORDER — ONDANSETRON HCL 4 MG/2ML IJ SOLN
INTRAMUSCULAR | Status: DC | PRN
  Administered 2018-10-15: 4 mg via INTRAVENOUS

## 2018-10-15 MED ORDER — HYDROMORPHONE HCL 1 MG/ML IJ SOLN
0.5000 mg | INTRAMUSCULAR | Status: DC | PRN
  Administered 2018-10-15 – 2018-10-16 (×3): 1 mg via INTRAVENOUS
  Filled 2018-10-15 (×3): qty 1

## 2018-10-15 MED ORDER — DULOXETINE HCL 60 MG PO CPEP
60.0000 mg | ORAL_CAPSULE | Freq: Two times a day (BID) | ORAL | Status: DC
  Administered 2018-10-15 – 2018-10-17 (×4): 60 mg via ORAL
  Filled 2018-10-15 (×4): qty 1

## 2018-10-15 MED ORDER — ACETAMINOPHEN 500 MG PO TABS
1000.0000 mg | ORAL_TABLET | Freq: Once | ORAL | Status: AC
  Administered 2018-10-15: 1000 mg via ORAL
  Filled 2018-10-15: qty 2

## 2018-10-15 MED ORDER — OXYBUTYNIN CHLORIDE 5 MG PO TABS: 5.0000 mg | ORAL_TABLET | Freq: Three times a day (TID) | ORAL | Status: DC | PRN

## 2018-10-15 MED ORDER — CLINDAMYCIN PHOSPHATE 900 MG/50ML IV SOLN
900.0000 mg | Freq: Once | INTRAVENOUS | Status: AC
  Administered 2018-10-15: 900 mg via INTRAVENOUS
  Filled 2018-10-15: qty 50

## 2018-10-15 MED ORDER — DEXAMETHASONE SODIUM PHOSPHATE 10 MG/ML IJ SOLN
INTRAMUSCULAR | Status: DC | PRN
  Administered 2018-10-15: 8 mg via INTRAVENOUS

## 2018-10-15 MED ORDER — ROPINIROLE HCL 1 MG PO TABS
2.0000 mg | ORAL_TABLET | Freq: Two times a day (BID) | ORAL | Status: DC
  Administered 2018-10-16 – 2018-10-17 (×3): 2 mg via ORAL
  Filled 2018-10-15 (×3): qty 2

## 2018-10-15 MED ORDER — PHENYLEPHRINE 40 MCG/ML (10ML) SYRINGE FOR IV PUSH (FOR BLOOD PRESSURE SUPPORT)
PREFILLED_SYRINGE | INTRAVENOUS | Status: DC | PRN
  Administered 2018-10-15: 200 ug via INTRAVENOUS
  Administered 2018-10-15: 280 ug via INTRAVENOUS
  Administered 2018-10-15: 80 ug via INTRAVENOUS

## 2018-10-15 MED ORDER — FENTANYL CITRATE (PF) 250 MCG/5ML IJ SOLN
INTRAMUSCULAR | Status: AC
  Filled 2018-10-15: qty 5

## 2018-10-15 MED ORDER — PANTOPRAZOLE SODIUM 40 MG PO TBEC
40.0000 mg | DELAYED_RELEASE_TABLET | Freq: Every day | ORAL | Status: DC
  Administered 2018-10-16 – 2018-10-17 (×2): 40 mg via ORAL
  Filled 2018-10-15 (×2): qty 1

## 2018-10-15 MED ORDER — GABAPENTIN 400 MG PO CAPS
800.0000 mg | ORAL_CAPSULE | Freq: Three times a day (TID) | ORAL | Status: DC
  Administered 2018-10-16 – 2018-10-17 (×4): 800 mg via ORAL
  Filled 2018-10-15 (×4): qty 2

## 2018-10-15 MED ORDER — ONDANSETRON HCL 4 MG/2ML IJ SOLN: 4.0000 mg | INTRAMUSCULAR | Status: DC | PRN

## 2018-10-15 MED ORDER — LACTATED RINGERS IV SOLN
INTRAVENOUS | Status: DC
  Administered 2018-10-15 (×2): via INTRAVENOUS
  Administered 2018-10-15: 1000 mL via INTRAVENOUS
  Administered 2018-10-15: 09:00:00 via INTRAVENOUS

## 2018-10-15 MED ORDER — ONDANSETRON HCL 4 MG/2ML IJ SOLN
INTRAMUSCULAR | Status: AC
  Administered 2018-10-15: 4 mg via INTRAVENOUS
  Filled 2018-10-15: qty 2

## 2018-10-15 MED ORDER — BUPIVACAINE LIPOSOME 1.3 % IJ SUSP
20.0000 mL | Freq: Once | INTRAMUSCULAR | Status: AC
  Administered 2018-10-15: 11:00:00 20 mL
  Filled 2018-10-15: qty 20

## 2018-10-15 MED ORDER — INSULIN ASPART 100 UNIT/ML ~~LOC~~ SOLN
4.0000 [IU] | Freq: Three times a day (TID) | SUBCUTANEOUS | Status: DC
  Administered 2018-10-15 – 2018-10-17 (×5): 4 [IU] via SUBCUTANEOUS

## 2018-10-15 MED ORDER — VASOPRESSIN 20 UNIT/ML IV SOLN
INTRAVENOUS | Status: AC
  Filled 2018-10-15: qty 1

## 2018-10-15 MED ORDER — SUCCINYLCHOLINE CHLORIDE 20 MG/ML IJ SOLN
INTRAMUSCULAR | Status: DC | PRN
  Administered 2018-10-15: 120 mg via INTRAVENOUS

## 2018-10-15 MED ORDER — EPHEDRINE SULFATE-NACL 50-0.9 MG/10ML-% IV SOSY
PREFILLED_SYRINGE | INTRAVENOUS | Status: DC | PRN
  Administered 2018-10-15: 20 mg via INTRAVENOUS
  Administered 2018-10-15: 10 mg via INTRAVENOUS
  Administered 2018-10-15: 20 mg via INTRAVENOUS
  Administered 2018-10-15: 30 mg via INTRAVENOUS

## 2018-10-15 MED ORDER — ONDANSETRON HCL 4 MG/2ML IJ SOLN
4.0000 mg | Freq: Once | INTRAMUSCULAR | Status: AC
  Administered 2018-10-15: 12:00:00 4 mg via INTRAVENOUS

## 2018-10-15 MED ORDER — SENNOSIDES-DOCUSATE SODIUM 8.6-50 MG PO TABS
2.0000 | ORAL_TABLET | Freq: Every day | ORAL | Status: DC
  Administered 2018-10-15 – 2018-10-16 (×2): 2 via ORAL
  Filled 2018-10-15 (×2): qty 2

## 2018-10-15 MED ORDER — TAMSULOSIN HCL 0.4 MG PO CAPS
0.4000 mg | ORAL_CAPSULE | Freq: Every day | ORAL | Status: DC
  Administered 2018-10-15 – 2018-10-17 (×3): 0.4 mg via ORAL
  Filled 2018-10-15 (×3): qty 1

## 2018-10-15 MED ORDER — SUGAMMADEX SODIUM 200 MG/2ML IV SOLN
INTRAVENOUS | Status: DC | PRN
  Administered 2018-10-15: 200 mg via INTRAVENOUS

## 2018-10-15 MED ORDER — PROPOFOL 10 MG/ML IV BOLUS
INTRAVENOUS | Status: AC
  Filled 2018-10-15: qty 20

## 2018-10-15 SURGICAL SUPPLY — 65 items
ADH SKN CLS APL DERMABOND .7 (GAUZE/BANDAGES/DRESSINGS)
AGENT HMST KT MTR STRL THRMB (HEMOSTASIS)
APL PRP STRL LF DISP 70% ISPRP (MISCELLANEOUS) ×1
BAG LAPAROSCOPIC 12 15 PORT 16 (BASKET) IMPLANT
BAG RETRIEVAL 12/15 (BASKET)
BAG RETRIEVAL 12/15MM (BASKET)
BAG SPEC RTRVL LRG 6X4 10 (ENDOMECHANICALS)
BAG SPEC THK2 15X12 ZIP CLS (MISCELLANEOUS) ×1
BAG ZIPLOCK 12X15 (MISCELLANEOUS) ×3 IMPLANT
BLADE EXTENDED COATED 6.5IN (ELECTRODE) IMPLANT
BLADE SURG SZ10 CARB STEEL (BLADE) IMPLANT
CABLE HIGH FREQUENCY MONO STRZ (ELECTRODE) ×3 IMPLANT
CHLORAPREP W/TINT 26 (MISCELLANEOUS) ×3 IMPLANT
CLEANER TIP ELECTROSURG 2X2 (MISCELLANEOUS) IMPLANT
CLIP VESOLOCK LG 6/CT PURPLE (CLIP) ×3 IMPLANT
CLIP VESOLOCK MED LG 6/CT (CLIP) IMPLANT
CLIP VESOLOCK XL 6/CT (CLIP) IMPLANT
COVER SURGICAL LIGHT HANDLE (MISCELLANEOUS) ×3 IMPLANT
COVER WAND RF STERILE (DRAPES) IMPLANT
CUTTER FLEX LINEAR 45M (STAPLE) ×2 IMPLANT
DERMABOND ADVANCED (GAUZE/BANDAGES/DRESSINGS)
DERMABOND ADVANCED .7 DNX12 (GAUZE/BANDAGES/DRESSINGS) IMPLANT
DRAIN CHANNEL 10F 3/8 F FF (DRAIN) IMPLANT
DRAPE INCISE IOBAN 66X45 STRL (DRAPES) ×3 IMPLANT
DRESSING TELFA ISLAND 4X8 (GAUZE/BANDAGES/DRESSINGS) ×2 IMPLANT
DRSG TEGADERM 2-3/8X2-3/4 SM (GAUZE/BANDAGES/DRESSINGS) ×6 IMPLANT
DRSG TEGADERM 4X4.75 (GAUZE/BANDAGES/DRESSINGS) IMPLANT
ELECT PENCIL ROCKER SW 15FT (MISCELLANEOUS) ×3 IMPLANT
ELECT REM PT RETURN 15FT ADLT (MISCELLANEOUS) ×3 IMPLANT
EVACUATOR SILICONE 100CC (DRAIN) IMPLANT
GLOVE BIO SURGEON STRL SZ 6.5 (GLOVE) ×2 IMPLANT
GLOVE BIO SURGEON STRL SZ7.5 (GLOVE) ×3 IMPLANT
GLOVE BIO SURGEONS STRL SZ 6.5 (GLOVE) ×1
GOWN STRL REUS W/TWL LRG LVL3 (GOWN DISPOSABLE) ×6 IMPLANT
GOWN STRL REUS W/TWL XL LVL3 (GOWN DISPOSABLE) ×3 IMPLANT
HEMOSTAT SURGICEL 4X8 (HEMOSTASIS) ×2 IMPLANT
IRRIG SUCT STRYKERFLOW 2 WTIP (MISCELLANEOUS) ×3
IRRIGATION SUCT STRKRFLW 2 WTP (MISCELLANEOUS) IMPLANT
KIT BASIN OR (CUSTOM PROCEDURE TRAY) ×3 IMPLANT
KIT TURNOVER KIT A (KITS) IMPLANT
LIGASURE VESSEL 5MM BLUNT TIP (ELECTROSURGICAL) ×2 IMPLANT
MARKER SKIN DUAL TIP RULER LAB (MISCELLANEOUS) ×2 IMPLANT
POUCH SPECIMEN RETRIEVAL 10MM (ENDOMECHANICALS) IMPLANT
RELOAD 45 VASCULAR/THIN (ENDOMECHANICALS) ×6 IMPLANT
RELOAD STAPLE 45 2.5 WHT GRN (ENDOMECHANICALS) IMPLANT
SCISSORS LAP 5X35 DISP (ENDOMECHANICALS) IMPLANT
SET TUBE SMOKE EVAC HIGH FLOW (TUBING) ×3 IMPLANT
SPONGE LAP 18X18 RF (DISPOSABLE) IMPLANT
STAPLER VISISTAT 35W (STAPLE) ×2 IMPLANT
SURGIFLO W/THROMBIN 8M KIT (HEMOSTASIS) IMPLANT
SUT ETHILON 3 0 PS 1 (SUTURE) IMPLANT
SUT MNCRL AB 4-0 PS2 18 (SUTURE) IMPLANT
SUT PDS AB 1 TP1 96 (SUTURE) IMPLANT
SUT VIC AB 2-0 SH 27 (SUTURE)
SUT VIC AB 2-0 SH 27X BRD (SUTURE) IMPLANT
SUT VICRYL 0 UR6 27IN ABS (SUTURE) IMPLANT
SYS LAPSCP GELPORT 120MM (MISCELLANEOUS) ×3
SYSTEM LAPSCP GELPORT 120MM (MISCELLANEOUS) ×1 IMPLANT
TOWEL OR 17X26 10 PK STRL BLUE (TOWEL DISPOSABLE) ×6 IMPLANT
TRAY FOLEY MTR SLVR 16FR STAT (SET/KITS/TRAYS/PACK) ×3 IMPLANT
TRAY LAPAROSCOPIC (CUSTOM PROCEDURE TRAY) ×3 IMPLANT
TROCAR BLADELESS OPT 5 100 (ENDOMECHANICALS) IMPLANT
TROCAR UNIVERSAL OPT 12M 100M (ENDOMECHANICALS) ×3 IMPLANT
TROCAR XCEL 12X100 BLDLESS (ENDOMECHANICALS) ×3 IMPLANT
YANKAUER SUCT BULB TIP 10FT TU (MISCELLANEOUS) ×3 IMPLANT

## 2018-10-15 NOTE — H&P (Signed)
CC/HPI: Cc: Left ureteral calculus, right renal mass.  HPI:  08/14/2018  Patient presented to the emergency department with severe left-sided flank pain. This was on 08/10/2018. He underwent a CT of the abdomen and pelvis without contrast. This revealed a 6 mm ureterovesicular junction calculus. The stone was visible on scout imaging. However, today on KUB, he has prominent colon and I did not visualize the stone. However, he continues with 8 out of 10 flank pain. He denies any fever. Occasional nausea and decreased appetite but no vomiting.   He was also incidentally found to have a centralized right renal mass. This was incompletely characterized. He denies any gross hematuria. Urinalysis shows persistent microscopic hematuria.   08/21/2018  In interval, the patient has gone to the emergency department for severe left-sided flank pain. He recently underwent a CT with and without contrast. This revealed persistence of the left distal ureteral calculus with upstream hydronephrosis. This also revealed an approximately 5 cm centralized enhancing right renal mass. In the interval, he passed several stones. His pain is resolved. He is now having some mild right sided flank pain. GFR was 60.5   09/17/2018  Patient status post left diagnostic ureteroscopy, which revealed no obvious stone. He also had a right diagnostic ureteroscopy, which revealed no obvious mass within the collecting system. He wants to proceed with right hand-assisted laparoscopic radical nephrectomy. He is here for voiding trial as well as stent removal.     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 Has not smoked since 07/31/1988.   Tobacco Use Assessment Completed: Used Tobacco in last 30 days? Drinks 1 caffeinated drink per day.    REVIEW OF SYSTEMS:    GU Review Male:   Patient denies frequent urination, hard to postpone urination, burning/ pain with urination, get up at night to urinate, leakage of urine, stream starts and stops, trouble starting your stream, have to strain to urinate , erection problems, and penile pain.  Gastrointestinal (Upper):   Patient denies nausea, vomiting, and indigestion/ heartburn.  Gastrointestinal (Lower):   Patient denies diarrhea and constipation.  Constitutional:   Patient denies fever, night sweats, weight loss, and fatigue.  Skin:   Patient denies itching and skin rash/ lesion.  Eyes:   Patient denies blurred vision and double vision.  Ears/ Nose/ Throat:   Patient denies sore throat and sinus problems.  Hematologic/Lymphatic:   Patient denies swollen glands and easy bruising.  Cardiovascular:   Patient denies leg swelling and chest pains.  Respiratory:   Patient denies cough and shortness of breath.  Endocrine:   Patient denies  excessive thirst.  Musculoskeletal:   Patient denies back pain and joint pain.  Neurological:   Patient denies headaches and dizziness.  Psychologic:   Patient denies depression and anxiety.   VITAL SIGNS:      09/17/2018 03:52 PM  BP 138/84 mmHg  Heart  Rate 93 /min  Temperature 98.5 F / 36.9 C   MULTI-SYSTEM PHYSICAL EXAMINATION:    Constitutional: Well-nourished. No physical deformities. Normally developed. Good grooming.  Respiratory: No labored breathing, no use of accessory muscles.   Cardiovascular: Normal temperature, adequate perfusion of extremities  Skin: No paleness, no jaundice  Neurologic / Psychiatric: Oriented to time, oriented to place, oriented to person. No depression, no anxiety, no agitation.  Gastrointestinal: No mass, no tenderness, no rigidity, mildly obese abdomen.   Eyes: Normal conjunctivae. Normal eyelids.  Musculoskeletal: Normal gait and station of head and neck.     PAST DATA REVIEWED:  Source Of History:  Patient  Records Review:   Previous Patient Records   PROCEDURES:         Flexible Cystoscopy Bilateral Stent Removal - 52310  Risks, benefits, and some of the potential complications of the procedure were discussed at length with the patient including infection, bleeding, voiding discomfort, urinary retention, fever, chills, sepsis, and others. All questions were answered. Informed consent was obtained. Antibiotic prophylaxis was given. Sterile technique and intraurethral analgesia were used.  Meatus:  Normal size. Normal location. Normal condition.  Urethra:  No strictures.  External Sphincter:  Normal.  Verumontanum:  Normal.  Prostate:  Borderline obstructing. Mild hyperplasia.  Bladder Neck:  Non-obstructing.  Ureteral Orifices:  Normal location. Normal size. Normal shape.   Bladder:  No trabeculation. No tumors. Normal mucosa. No stones.  Bilateral ureteral stents were carefully removed with a grasping instrument.    The lower urinary tract was carefully examined. The procedure was well-tolerated and without complications. Antibiotic instructions were given. Instructions were given to call the office immediately for bloody urine, difficulty urinating, urinary retention, painful or frequent  urination, fever, chills, nausea, vomiting or other illness. The patient stated that he understood these instructions and would comply with them.   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 He understands potential risks including but not limited to bleeding, infection, injury to surrounding structures, need for additional procedures, potential for myocardial infarction, stroke, bleeding requiring transfusion, blood clot, and death.   Cc: Dr. Woody Seller, M.D.   Signed by Link Snuffer, III, M.D. on 09/17/18 at 4:40 PM (EDT

## 2018-10-15 NOTE — Anesthesia Procedure Notes (Signed)
Procedure Name: Intubation Performed by: Gean Maidens, CRNA Pre-anesthesia Checklist: Patient identified, Emergency Drugs available, Suction available, Patient being monitored and Timeout performed Patient Re-evaluated:Patient Re-evaluated prior to induction Preoxygenation: Pre-oxygenation with 100% oxygen Induction Type: IV induction Ventilation: Mask ventilation without difficulty Laryngoscope Size: Mac and 4 Grade View: Grade I Tube type: Oral Tube size: 7.5 mm Number of attempts: 1 Airway Equipment and Method: Stylet Placement Confirmation: ETT inserted through vocal cords under direct vision,  positive ETCO2,  CO2 detector and breath sounds checked- equal and bilateral Secured at: 23 cm Tube secured with: Tape Dental Injury: Teeth and Oropharynx as per pre-operative assessment

## 2018-10-15 NOTE — Progress Notes (Signed)
PT TRANSPORTED TO ROOM WITH 1 bag.

## 2018-10-15 NOTE — Progress Notes (Signed)
Post-op note  Subjective: The patient is doing well.  No complaints.  6cm raised area superficial to 12cm midline port. Compressible. Non tender. No drainage. Appears to be area of trapped air from port (did not close 12 port). Does not feel like a hernia or hematoma.   Will continue to watch   Objective: Vital signs in last 24 hours: Temp:  [97.3 F (36.3 C)-98.4 F (36.9 C)] 97.7 F (36.5 C) (07/15 1406) Pulse Rate:  [62-75] 72 (07/15 1406) Resp:  [11-20] 18 (07/15 1406) BP: (115-163)/(73-100) 139/87 (07/15 1406) SpO2:  [93 %-100 %] 100 % (07/15 1406) Weight:  [106.6 kg-109.5 kg] 109.5 kg (07/15 1406)  Intake/Output from previous day: No intake/output data recorded. Intake/Output this shift: Total I/O In: 2500 [I.V.:2500] Out: 500 [Urine:400; Blood:100]  Physical Exam:  General: Alert and oriented. Abdomen: Soft, Nondistended.  Incisions: Clean and dry. GU: foley catheter to drainage, draining   Lab Results: Recent Labs    10/15/18 1237  HGB 12.9*  HCT 40.6    Assessment/Plan: POD#0   1) Continue to monitor   Tharon Aquas, MD   LOS: 0 days   Tharon Aquas 10/15/2018, 3:33 PM

## 2018-10-15 NOTE — Transfer of Care (Signed)
Immediate Anesthesia Transfer of Care Note  Patient: Jason [name]  Procedure(s) Performed: HAND ASSISTED LAPAROSCOPIC NEPHRECTOMY (Right Abdomen)  Patient Location: PACU  Anesthesia Type:General  Level of Consciousness: sedated, patient cooperative and responds to stimulation  Airway & Oxygen Therapy: Patient Spontanous Breathing and Patient connected to face mask oxygen  Post-op Assessment: Report given to RN and Post -op Vital signs reviewed and stable  Post vital signs: Reviewed and stable  Last Vitals:  Vitals Value Taken Time  BP 163/97 10/15/18 1126  Temp    Pulse 65 10/15/18 1128  Resp 17 10/15/18 1128  SpO2 96 % 10/15/18 1128  Vitals shown include unvalidated device data.  Last Pain:  Vitals:   10/15/18 0716  TempSrc:   PainSc: 0-No pain      Patients Stated Pain Goal: 4 (40/97/35 3299)  Complications: No apparent anesthesia complications

## 2018-10-15 NOTE — Op Note (Signed)
Operative Note  Preoperative diagnosis:  1.  Right renal mass  Postoperative diagnosis: 1.  Right renal mass  Procedure(s): 1.  Right hand assisted laparoscopic radical nephrectomy  Surgeon: Link Snuffer, MD  Assistants: Tharon Aquas, resident an assistant was needed throughout the case further expertise in assisting with a laparoscopic surgery, including visualization with the camera, passing instruments, etc.  Anesthesia: General  Complications: None  EBL: 100 cc  Specimens: 1.  Right kidney  Drains/Catheters: 1. Foley catheter  Intraoperative findings: Right kidney removed entirely  Indication: 73 year old male who was found on imaging to have a right renal mass concerning for renal cell carcinoma.  After discussion of different options, the patient elected to undergo the above operation.  Description of procedure:  The patient was identified and consent was obtained.  The patient was taken to the operating room and placed in the supine position.  The patient was placed under general anesthesia.  Perioperative antibiotics were administered.  The patient was placed in right lateral position at approximately 65 degrees and all pressure points were padded.  Patient was prepped and draped in a standard sterile fashion and a timeout was performed.  An 8 cm periumbilical incision was made sharply into the skin.  This was carried down with Bovie electrocautery down to the anterior rectus sheath which was divided with electrocautery.  The underlying musculature was separated in the midline.  Sharp dissection with Metzenbaum scissors was used to open up the posterior sheath and peritoneum.  This was extended with electrocautery taking great care not to use cautery near the bowel.  The hand assist port was secured into the incision.  I made sure no bowel was trapped within this.  A 12 mm port was inserted through the hand assist port and the abdomen was insufflated to a pressure of 15.  A  12 mm port was placed lateral as well as superior to the hand assist port, each 1 about a hand width away. A 5 mm port was placed superior to the midline port and used for liver retraction.  Please note that all ports were placed under direct visualization with the camera.  The colon was first dissected medially by incising along the white line of Toldt.  After medializing the colon, the kidney was dissected laterally and medially as well as superiorly.  Inferior attachments as well as the ureter and gonadal vein were divided with LigaSure device.  I continued to carefully dissect medially and identified the renal hilum.  The renal vein and renal artery were divided with a 45 mm vascular staple load.  Superior attachments were then released using LigaSure device as well as blunt dissection.  Once the entire kidney and surrounding Gerota's fascia was freed, the specimen was withdrawn from the midline incision and passed off for permanent specimen.  The abdomen was reinspected and no active bleeding was noted.  Surgicel was applied to the nephrectomy bed.  The midline fascia was closed with running 0 looped PDS suture followed by staples.  The port incisions were closed with staples.  Exparel was instilled for anesthetic effect.  A dressing was applied.  Patient tolerated the procedure well and was stable postoperatively.  Plan: Stat labs will be obtained.  Anticipate the patient will be in the hospital 1-2 nights as long as he does well.

## 2018-10-15 NOTE — Anesthesia Preprocedure Evaluation (Addendum)
Anesthesia Evaluation  Patient identified by MRN, date of birth, ID band Patient awake    Reviewed: Allergy & Precautions, NPO status , Patient's Chart, lab work & pertinent test results, reviewed documented beta blocker date and time   Airway Mallampati: II  TM Distance: >3 FB Neck ROM: Full    Dental no notable dental hx. (+) Missing, Dental Advisory Given,    Pulmonary sleep apnea (does not use CPAP) , former smoker,    Pulmonary exam normal breath sounds clear to auscultation       Cardiovascular hypertension, Pt. on home beta blockers and Pt. on medications Normal cardiovascular exam Rhythm:Regular Rate:Normal  TTE 2013: EF 63%, no significant valvular abnormalities   Neuro/Psych TIAnegative psych ROS   GI/Hepatic Neg liver ROS, GERD  Medicated and Controlled,  Endo/Other  negative endocrine ROSdiabetes, Oral Hypoglycemic Agents  Renal/GU Right renal mass  negative genitourinary   Musculoskeletal  (+) Arthritis ,   Abdominal   Peds  Hematology negative hematology ROS (+)   Anesthesia Other Findings   Reproductive/Obstetrics                            Anesthesia Physical Anesthesia Plan  ASA: III  Anesthesia Plan: General   Post-op Pain Management:    Induction: Intravenous  PONV Risk Score and Plan: 2 and Ondansetron and Dexamethasone  Airway Management Planned: Oral ETT  Additional Equipment:   Intra-op Plan:   Post-operative Plan: Extubation in OR  Informed Consent: I have reviewed the patients History and Physical, chart, labs and discussed the procedure including the risks, benefits and alternatives for the proposed anesthesia with the patient or authorized representative who has indicated his/her understanding and acceptance.     Dental advisory given  Plan Discussed with: CRNA  Anesthesia Plan Comments:         Anesthesia Quick Evaluation

## 2018-10-16 ENCOUNTER — Encounter (HOSPITAL_COMMUNITY): Payer: Self-pay | Admitting: Urology

## 2018-10-16 LAB — BASIC METABOLIC PANEL
Anion gap: 10 (ref 5–15)
BUN: 13 mg/dL (ref 8–23)
CO2: 26 mmol/L (ref 22–32)
Calcium: 8.1 mg/dL — ABNORMAL LOW (ref 8.9–10.3)
Chloride: 98 mmol/L (ref 98–111)
Creatinine, Ser: 0.94 mg/dL (ref 0.61–1.24)
GFR calc Af Amer: >60 mL/min (ref >60)
GFR calc non Af Amer: >60 mL/min (ref >60)
Glucose, Bld: 111 mg/dL — ABNORMAL HIGH (ref 70–99)
Potassium: 4.1 mmol/L (ref 3.5–5.1)
Sodium: 134 mmol/L — ABNORMAL LOW (ref 135–145)

## 2018-10-16 LAB — HEMOGLOBIN AND HEMATOCRIT, BLOOD
HCT: 36 % — ABNORMAL LOW (ref 39.0–52.0)
Hemoglobin: 12 g/dL — ABNORMAL LOW (ref 13.0–17.0)

## 2018-10-16 LAB — GLUCOSE, CAPILLARY
Glucose-Capillary: 105 mg/dL — ABNORMAL HIGH (ref 70–99)
Glucose-Capillary: 126 mg/dL — ABNORMAL HIGH (ref 70–99)
Glucose-Capillary: 106 mg/dL — ABNORMAL HIGH (ref 70–99)
Glucose-Capillary: 132 mg/dL — ABNORMAL HIGH (ref 70–99)

## 2018-10-16 NOTE — Anesthesia Postprocedure Evaluation (Signed)
Anesthesia Post Note  Patient: Jason [name]  Procedure(s) Performed: HAND ASSISTED LAPAROSCOPIC NEPHRECTOMY (Right Abdomen)     Patient location during evaluation: PACU Anesthesia Type: General Level of consciousness: awake and alert Pain management: pain level controlled Vital Signs Assessment: post-procedure vital signs reviewed and stable Respiratory status: spontaneous breathing, nonlabored ventilation, respiratory function stable and patient connected to nasal cannula oxygen Cardiovascular status: blood pressure returned to baseline and stable Postop Assessment: no apparent nausea or vomiting Anesthetic complications: no    Last Vitals:  Vitals:   10/16/18 0212 10/16/18 0452  BP: 117/78 117/74  Pulse: 71 71  Resp: 16 16  Temp: 36.6 C 36.9 C  SpO2: 98% 92%    Last Pain:  Vitals:   10/16/18 1054  TempSrc:   PainSc: 3                  Aidan Caloca L Angee Gupton

## 2018-10-16 NOTE — Progress Notes (Signed)
Urology Post-op note  Subjective: 73 year old male status post right hand-assisted radical nephrectomy on 7/15  patient did well last night.  Tolerated clears.  Has not ambulated yet.  Some discomfort underneath his right rib but pain is controlled.  Labs stable.  Objective: Vital signs in last 24 hours: Temp:  [97.3 F (36.3 C)-98.6 F (37 C)] 98.4 F (36.9 C) (07/16 0452) Pulse Rate:  [62-87] 71 (07/16 0452) Resp:  [11-20] 16 (07/16 0452) BP: (106-163)/(66-97) 117/74 (07/16 0452) SpO2:  [92 %-100 %] 92 % (07/16 0452) Weight:  [109.5 kg] 109.5 kg (07/15 1406)  Intake/Output from previous day: 07/15 0701 - 07/16 0700 In: 4591.3 [P.O.:240; I.V.:4251.3; IV Piggyback:100] Out: 1800 [Urine:1700; Blood:100] Intake/Output this shift: No intake/output data recorded.  Physical Exam:  General: Alert and oriented. Abdomen: Soft.  Subcu distention above 12 cm midline port.  Still nontender and consistent with a subcu air. No drainage, does not appear to be hernia or hematoma  Incisions: Clean and dry. GU: foley catheter to drainage, draining   Lab Results: Recent Labs    10/15/18 1237 10/16/18 0426  HGB 12.9* 12.0*  HCT 40.6 36.0*    Assessment/Plan: POD#1  -Discontinue fluids -Discontinue Foley, trial of void -Ambulate - Soft diet - Will likely stay until 7/17 but if doing well by this afternoon could potentially discharge today -All home meds restarted    Tharon Aquas, MD   LOS: 1 day   Tharon Aquas 10/16/2018, 7:31 AM

## 2018-10-16 NOTE — Progress Notes (Addendum)
Patient ambulated 500 ft in hallways with stand by assist and walker. Tolerated well. Pain minimal. Returned to chair.  No urge to void yet since foley removal this AM at 0845.  Urinal at bedside

## 2018-10-16 NOTE — Progress Notes (Signed)
Patient voided in toilet - unable to measure.  Bladder scan reveals 0 cc.  Patients states he feels bladder emptied fully as well.  Also passing gas.

## 2018-10-17 LAB — GLUCOSE, CAPILLARY: Glucose-Capillary: 113 mg/dL — ABNORMAL HIGH (ref 70–99)

## 2018-10-17 NOTE — Progress Notes (Signed)
All discharge paperwork including medications and follow up plan discussed with patient. Dr. Gloriann Loan paged to clarify when pt should resume aspirin and he stated it would be ok to resume this evening, pt informed. IV removed. All personal belongings including medications returned to pt. Wife, Remo Lipps, to pick up patient.

## 2018-10-17 NOTE — Discharge Summary (Signed)
[facility] Discharge Summary  Admit date: 10/15/2018  Discharge date and time: 10/17/18   Discharge to: Home  Discharge Service: Urology  Discharge Attending Physician:  Dr. Lovena Neighbours  Discharge  Diagnoses: Renal mass  OR Procedures: Procedure(s): HAND ASSISTED LAPAROSCOPIC NEPHRECTOMY 10/15/2018   Ancillary Procedures: None   Discharge Day Services: The patient was seen and examined by the Urology team both in the morning and immediately prior to discharge.  Vital signs and laboratory values were stable and within normal limits.  The physical exam was benign and unchanged and all surgical wounds were examined.  Discharge instructions were explained and all questions answered.  Subjective  No acute events overnight. Pain Controlled. No fever or chills.  Objective Patient Vitals for the past 8 hrs:  BP Temp Temp src Pulse Resp SpO2  10/17/18 0549 110/70 97.8 F (36.6 C) Oral 66 18 95 %   No intake/output data recorded.  General Appearance:        No acute distress Lungs:                       Normal work of breathing on room air Heart:                                Regular rate and rhythm Abdomen:                         Soft, non-tender, non-distended.  Area of swelling above 12 mm port is resolved.  Dressing removed this morning and incisions are clean dry and intact. Extremities:                      Warm and well perfused   Hospital Course:  The patient underwent right hand-assisted nephrectomy on 10/15/2018.  The patient tolerated the procedure well, was extubated in the OR, and afterwards was taken to the PACU for routine post-surgical care. When stable the patient was transferred to the floor.     The patient did well postoperatively.  He passed a trial of void on postoperative day 1 and was having flatus prior to discharge.  Stayed until postoperative day 2 due to pain controlled by the time of discharge and his pain controlled on oral medications only.    The  patient was discharged home 2 Days Post-Op, at which point was tolerating a regular solid diet, was able to void spontaneously, have adequate pain control with P.O. pain medication, and could ambulate without difficulty. The patient will follow up with [place] for post op check.  Pathology was not discussed prior to discharge.  Condition at Discharge: Improved  Discharge Medications:  Allergies as of 10/17/2018      Reactions   Augmentin [amoxicillin-pot Clavulanate] Nausea Only   Did it involve swelling of the face/tongue/throat, SOB, or low BP? No Did it involve sudden or severe rash/hives, skin peeling, or any reaction on the inside of your mouth or nose? No Did you need to seek medical attention at a hospital or doctor's office? No When did it last happen?15-20 years ago If all above answers are "NO", may proceed with cephalosporin use.   Penicillins Nausea Only   Did it involve swelling of the face/tongue/throat, SOB, or low BP? No Did it involve sudden or severe rash/hives, skin peeling, or any reaction on the inside of your mouth or nose? No Did  you need to seek medical attention at a hospital or doctor's office? No When did it last happen?10-15 years ago If all above answers are "NO", may proceed with cephalosporin use.      Medication List    TAKE these medications   ADULT GUMMY PO Take 2 tablets by mouth daily.   aspirin EC 81 MG tablet Take 81 mg by mouth every evening.   carbamazepine 200 MG tablet Commonly known as: TEGRETOL TAKE 1/2 TABLET BY MOUTH TWICE DAILY What changed: when to take this   [employer] FIBER GUMMIES PO Take 2 tablets by mouth daily.   DULoxetine 60 MG capsule Commonly known as: CYMBALTA Take 1 capsule (60 mg total) by mouth 2 (two) times daily.   ELDERBERRY PO Take 2 tablets by mouth daily. Gummies   gabapentin 800 MG tablet Commonly known as: NEURONTIN TAKE 1 TABLET BY MOUTH THREE TIMES DAILY   HYDROcodone-acetaminophen 5-325 MG tablet Commonly  known as: Norco Take 1 tablet by mouth every 4 (four) hours as needed for moderate pain.   Lubricant Eye Drops 0.4-0.3 % Soln Generic drug: Polyethyl Glycol-Propyl Glycol Place 1 drop into both eyes 3 (three) times daily as needed (dry eyes).   metFORMIN 500 MG tablet Commonly known as: GLUCOPHAGE Take 500 mg by mouth 2 (two) times daily with a meal.   metoprolol tartrate 25 MG tablet Commonly known as: LOPRESSOR Take 25 mg by mouth every evening.   oxybutynin 5 MG tablet Commonly known as: DITROPAN Take 1 tablet (5 mg total) by mouth 3 (three) times daily.   pantoprazole 40 MG tablet Commonly known as: PROTONIX Take 40 mg by mouth daily.   promethazine 25 MG tablet Commonly known as: PHENERGAN Take 12.5 mg by mouth every 6 (six) hours as needed for nausea.   rOPINIRole 2 MG tablet Commonly known as: REQUIP One tablet twice during the day and 2 at night What changed:   how much to take  how to take this  when to take this  additional instructions   tamsulosin 0.4 MG Caps capsule Commonly known as: FLOMAX Take 0.4 mg by mouth daily.

## 2018-11-07 ENCOUNTER — Ambulatory Visit: Payer: Medicare Other | Admitting: Neurology

## 2018-11-11 NOTE — Progress Notes (Signed)
PATIENT: Jason [name] DOB: 05-Jun-1945  REASON FOR VISIT: follow up HISTORY FROM: patient  HISTORY OF PRESENT ILLNESS: Today 11/12/18  Mr. Gover is a 73 year old male with history of diabetes, small fiber neuropathy, and mild memory disturbance.  He has a significant moderate discomfort up to his knees bilaterally.  At times he may have lancinating pains.  He has difficulty sleeping as result of his restless leg syndrome.  He remains on Requip taking 2 mg tablet, 1 tablet twice daily, and 2 at night.  He also takes carbamazepine 200 mg tablet, half a tablet twice daily, along with Cymbalta 60 mg twice daily, and gabapentin 800 mg 3 times daily.  His last memory score was 28/30.  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 He has seen good benefit with the carbamazepine. The pains come and go. He hasn't been doing much after his surgery. He has good and bad days with the pain in his legs. He says he doesn't sleep much lately. He hasn't been very tired at night lately. The numbness pain comes up higher in his legs. His diabetes is under good control, and his doctor is considering taking him off medication.  He needs a new CPAP machine. He needs to talk with his primary care doctor. He has not had any falls, but uses a cane.  He said he had 1 lightheaded spell while visiting his sister, but he had not eaten.  He went home and ate and felt much better.  He lives with his wife and drives a car. He presents today for follow-up unaccompanied.   HISTORY 05/09/2018 Dr. Jannifer Franklin: Mr. Dirk is a 73 year old right-handed white male with a history of diabetes with a small fiber neuropathy.  The patient has a significant amount of discomfort associated with the neuropathy, he has discomfort up to the knee levels bilaterally, he will have lancinating pains at times.  The patient has difficulty sleeping in part secondary to this and in part related to his restless leg  syndrome.  He takes Requip 2 mg 3 times daily, this is not adequate many nights to allow him to rest.  The patient does have some mild gait instability, he has not had any falls recently.  He will stumble.  He reports bilateral knee pain.  He has ongoing chronic fatigue, he has sleep apnea but he is not on CPAP.  The patient has just started melatonin at night, he has a TENS unit to try to see if this helps the pain.  The patient returns to this office for an evaluation.  The patient continues to note some problems with memory, he is somewhat forgetful, he will have trouble with directions with driving at times.  He is continue to work however.  REVIEW OF SYSTEMS: Out of a complete 14 system review of symptoms, the patient complains only of the following symptoms, and all other reviewed systems are negative.  Restless leg syndrome, neuropathy, leg pain, insomnia, sleep apnea  ALLERGIES: Allergies  Allergen Reactions   Augmentin [Amoxicillin-Pot Clavulanate] Nausea Only    Did it involve swelling of the face/tongue/throat, SOB, or low BP? No Did it involve sudden or severe rash/hives, skin peeling, or any reaction on the inside of your mouth or nose? No Did you need to seek medical attention at a hospital or doctor's office? No When did it last happen?15-20 years ago If all above answers are "NO", may proceed with  cephalosporin use.    Penicillins Nausea Only    Did it involve swelling of the face/tongue/throat, SOB, or low BP? No Did it involve sudden or severe rash/hives, skin peeling, or any reaction on the inside of your mouth or nose? No Did you need to seek medical attention at a hospital or doctor's office? No When did it last happen?10-15 years ago If all above answers are "NO", may proceed with cephalosporin use.      HOME MEDICATIONS: Outpatient Medications Prior to Visit  Medication Sig Dispense Refill   aspirin EC 81 MG tablet Take 81 mg by mouth every evening.      carbamazepine (TEGRETOL) 200 MG tablet TAKE 1/2 TABLET BY MOUTH TWICE DAILY (Patient taking differently: Take 100 mg by mouth 2 (two) times a day. ) 30 tablet 3   [employer] FIBER GUMMIES PO Take 2 tablets by mouth daily.     DULoxetine (CYMBALTA) 60 MG capsule Take 1 capsule (60 mg total) by mouth 2 (two) times daily. 180 capsule 3   ELDERBERRY PO Take 2 tablets by mouth daily. Gummies     gabapentin (NEURONTIN) 800 MG tablet TAKE 1 TABLET BY MOUTH THREE TIMES DAILY (Patient taking differently: Take 800 mg by mouth 3 (three) times daily. ) 270 tablet 2   metFORMIN (GLUCOPHAGE) 500 MG tablet Take 500 mg by mouth 2 (two) times daily with a meal.     metoprolol tartrate (LOPRESSOR) 25 MG tablet Take 25 mg by mouth every evening.      Multiple Vitamins-Minerals (ADULT GUMMY PO) Take 2 tablets by mouth daily.     pantoprazole (PROTONIX) 40 MG tablet Take 40 mg by mouth daily.     Polyethyl Glycol-Propyl Glycol (LUBRICANT EYE DROPS) 0.4-0.3 % SOLN Place 1 drop into both eyes 3 (three) times daily as needed (dry eyes).      rOPINIRole (REQUIP) 2 MG tablet One tablet twice during the day and 2 at night (Patient taking differently: Take 4 mg by mouth 3 (three) times daily. ) 360 tablet 1   tamsulosin (FLOMAX) 0.4 MG CAPS capsule Take 0.4 mg by mouth daily.     HYDROcodone-acetaminophen (NORCO) 5-325 MG tablet Take 1 tablet by mouth every 4 (four) hours as needed for moderate pain. (Patient not taking: Reported on 11/12/2018) 10 tablet 0   oxybutynin (DITROPAN) 5 MG tablet Take 1 tablet (5 mg total) by mouth 3 (three) times daily. (Patient not taking: Reported on 10/08/2018) 15 tablet 3   promethazine (PHENERGAN) 25 MG tablet Take 12.5 mg by mouth every 6 (six) hours as needed for nausea.     No facility-administered medications prior to visit.     PAST MEDICAL HISTORY: Past Medical History:  Diagnosis Date   Diabetes mellitus without complication (HCC)    GERD (gastroesophageal reflux disease)      History of anemia    Childhood   History of cataract    History of kidney stones    History of migraine    resolved   Hypertension    states under control with med., has been on med. x 5 yr.   Insomnia    Low blood pressure    x3 hospitalized once for it   Memory difficulty 05/09/2018   MRSA (methicillin resistant staph aureus) culture positive 09/03/2018   Paresthesia 04/27/2015   Peripheral neuropathy 11/06/2017   RLS (restless legs syndrome) 04/27/2015   Seasonal allergies    Sleep apnea    no CPAP use, REPORTS HE NEEDS  A NEW ONE    Staph aureus infection 09/03/2018   TIA (transient ischemic attack)    Unilateral osteoarthritis of knee 12/2014   left    PAST SURGICAL HISTORY: Past Surgical History:  Procedure Laterality Date   CATARACT EXTRACTION, BILATERAL     COLONOSCOPY     CYSTOSCOPY WITH URETEROSCOPY AND STENT PLACEMENT Bilateral 09/08/2018   Procedure: CYSTOSCOPY WITH BILATERAL  URETEROSCOPY HOLMIUM LASER AND STENT PLACEMENT;  Surgeon: Lucas Mallow, MD;  Location: [facility];  Service: Urology;  Laterality: Bilateral;   LAPAROSCOPIC NEPHRECTOMY, HAND ASSISTED Right 10/15/2018   Procedure: HAND ASSISTED LAPAROSCOPIC NEPHRECTOMY;  Surgeon: Lucas Mallow, MD;  Location: [facility];  Service: Urology;  Laterality: Right;   PARTIAL KNEE ARTHROPLASTY Right 06/02/2013   Procedure: RIGHT UNICOMPARTMENTAL KNEE;  Surgeon: Johnny Bridge, MD;  Location: [facility];  Service: Orthopedics;  Laterality: Right;   PARTIAL KNEE ARTHROPLASTY Left 12/17/2014   Procedure: LEFT KNEE UNI ARTHROPLASTY ;  Surgeon: Marchia Bond, MD;  Location: [facility];  Service: Orthopedics;  Laterality: Left;   TONSILLECTOMY  1950's   UPPER GI ENDOSCOPY      FAMILY HISTORY: Family History  Problem Relation Age of Onset   Emphysema Father        Died age 8   COPD Father    Sudden death Mother        Died age 49   CAD Brother        Died age 66   Hypertension  Brother     SOCIAL HISTORY: Social History   Socioeconomic History   Marital status: Married    Spouse name: Not on file   Number of children: 2   Years of education: 14   Highest education level: Not on file  Occupational History   Occupation: self-employed  Scientist, product/process development strain: Not on file   Food insecurity    Worry: Not on file    Inability: Not on file   Transportation needs    Medical: Not on file    Non-medical: Not on file  Tobacco Use   Smoking status: Former Smoker    Types: Pipe   Smokeless tobacco: Never Used   Tobacco comment: 06/02/2013   Substance and Sexual Activity   Alcohol use: Yes    Comment: occasionally   Drug use: No   Sexual activity: Yes  Lifestyle   Physical activity    Days per week: Not on file    Minutes per session: Not on file   Stress: Not on file  Relationships   Social connections    Talks on phone: Not on file    Gets together: Not on file    Attends religious service: Not on file    Active member of club or organization: Not on file    Attends meetings of clubs or organizations: Not on file    Relationship status: Not on file   Intimate partner violence    Fear of current or ex partner: Not on file    Emotionally abused: Not on file    Physically abused: Not on file    Forced sexual activity: Not on file  Other Topics Concern   Not on file  Social History Narrative   Retiring from IT sales professional.     Patient drinks 1 cup of caffeine daily.   Patient is right handed.     PHYSICAL EXAM  Vitals:   11/12/18 0852  BP: 136/88  Pulse: 63  Temp: 98 F (36.7 C)  TempSrc: Oral  Weight: 232 lb 9.6 oz (105.5 kg)  Height: 5\' 10"  (1.778 m)   Body mass index is 33.37 kg/m.  Generalized: Well developed, in no acute distress   Neurological examination  Mentation: Alert oriented to time, place, history taking. Follows all commands speech and language fluent Cranial nerve II-XII: Pupils were equal  round reactive to light. Extraocular movements were full, visual field were full on confrontational test. Facial sensation and strength were normal.  Head turning and shoulder shrug were normal and symmetric. Motor: The motor testing reveals 5 over 5 strength of all 4 extremities. Good symmetric motor tone is noted throughout.  Sensory: Stocking pattern sensory deficit in bilateral lower extremities, decreased vibration sensation on the LLE. No evidence of extinction is noted.  Coordination: Cerebellar testing reveals good finger-nose-finger and heel-to-shin bilaterally.  Gait and station: Gait is normal. Tandem gait is unsteady.  Romberg is mildly positive. No drift is seen.  Reflexes: Deep tendon reflexes are symmetric and normal bilaterally.   DIAGNOSTIC DATA (LABS, IMAGING, TESTING) - I reviewed patient records, labs, notes, testing and imaging myself where available.  Lab Results  Component Value Date   WBC 6.2 10/10/2018   HGB 12.0 (L) 10/16/2018   HCT 36.0 (L) 10/16/2018   MCV 96.3 10/10/2018   PLT 215 10/10/2018      Component Value Date/Time   NA 134 (L) 10/16/2018 0426   K 4.1 10/16/2018 0426   CL 98 10/16/2018 0426   CO2 26 10/16/2018 0426   GLUCOSE 111 (H) 10/16/2018 0426   BUN 13 10/16/2018 0426   CREATININE 0.94 10/16/2018 0426   CALCIUM 8.1 (L) 10/16/2018 0426   PROT 7.0 04/27/2015 0828   GFRNONAA >60 10/16/2018 0426   GFRAA >60 10/16/2018 0426   No results found for: CHOL, HDL, LDLCALC, LDLDIRECT, TRIG, CHOLHDL Lab Results  Component Value Date   HGBA1C 6.3 (H) 09/03/2018   Lab Results  Component Value Date   IEPPIRJJ88 416 04/08/2017   No results found for: TSH   ASSESSMENT AND PLAN 73 y.o. year old male  has a past medical history of Diabetes mellitus without complication ([place]), GERD (gastroesophageal reflux disease), History of anemia, History of cataract, History of kidney stones, History of migraine, Hypertension, Insomnia, Low blood pressure, Memory  difficulty (05/09/2018), MRSA (methicillin resistant staph aureus) culture positive (09/03/2018), Paresthesia (04/27/2015), Peripheral neuropathy (11/06/2017), RLS (restless legs syndrome) (04/27/2015), Seasonal allergies, Sleep apnea, Staph aureus infection (09/03/2018), TIA (transient ischemic attack), and Unilateral osteoarthritis of knee (12/2014). here with:  1. Restless leg syndrome  2.  Peripheral neuropathy, small fiber  He reports benefit with carbamazepine, but still has sharp lancinating pains in the evening.  I will check lab work today, to include a carbamazepine level.  We will likely go up on the medication if the level allows. He is taking Carbamazepine 200 mg tablet, 1/2 tablet twice daily. I plan to increase to 1/2 tablet in the morning, full tablet in the evening.   He will continue taking Requip, gabapentin, and Cymbalta.  He did recently have a right nephrectomy for a renal mass.  I suggested he discuss with his primary care doctor about getting a new CPAP machine.  He will follow-up in 6 months or sooner if needed.  I problems worsen or if he develops any new symptoms he should let us know. I have refilled his requip today.   I spent 25 minutes with the patient.  50% of this time was spent discussing his plan of care.   Butler Denmark, AGNP-C, DNP 11/12/2018, 9:09 AM [facility] [address], [place], [place] [address] [phone]

## 2018-11-12 ENCOUNTER — Encounter: Payer: Self-pay | Admitting: Neurology

## 2018-11-12 ENCOUNTER — Ambulatory Visit: Payer: Medicare Other | Admitting: Neurology

## 2018-11-12 ENCOUNTER — Other Ambulatory Visit: Payer: Self-pay

## 2018-11-12 VITALS — BP 136/88 | HR 63 | Temp 98.0°F | Ht 70.0 in | Wt 232.6 lb

## 2018-11-12 DIAGNOSIS — G2581 Restless legs syndrome: Secondary | ICD-10-CM | POA: Diagnosis not present

## 2018-11-12 DIAGNOSIS — G603 Idiopathic progressive neuropathy: Secondary | ICD-10-CM | POA: Diagnosis not present

## 2018-11-12 DIAGNOSIS — R202 Paresthesia of skin: Secondary | ICD-10-CM

## 2018-11-12 MED ORDER — ROPINIROLE HCL 2 MG PO TABS: ORAL_TABLET | ORAL | 1 refills | Status: DC

## 2018-11-12 NOTE — Patient Instructions (Signed)
It was wonderful to meet you today! We will check lab work today. We will likely go up on the carbamazepine is the level allows.

## 2018-11-12 NOTE — Progress Notes (Signed)
I have read the note, and I agree with the clinical assessment and plan.  Latrenda Irani K Cynthia Stainback   

## 2018-11-13 ENCOUNTER — Telehealth: Payer: Self-pay | Admitting: Neurology

## 2018-11-13 LAB — CBC WITH DIFFERENTIAL/PLATELET
Basophils Absolute: 0.1 10*3/uL (ref 0.0–0.2)
Basos: 1 %
EOS (ABSOLUTE): 0.4 10*3/uL (ref 0.0–0.4)
Eos: 6 %
Hematocrit: 37.4 % — ABNORMAL LOW (ref 37.5–51.0)
Hemoglobin: 12.8 g/dL — ABNORMAL LOW (ref 13.0–17.7)
Immature Grans (Abs): 0 10*3/uL (ref 0.0–0.1)
Immature Granulocytes: 0 %
Lymphocytes Absolute: 1.8 10*3/uL (ref 0.7–3.1)
Lymphs: 27 %
MCH: 31.7 pg (ref 26.6–33.0)
MCHC: 34.2 g/dL (ref 31.5–35.7)
MCV: 93 fL (ref 79–97)
Monocytes Absolute: 0.5 10*3/uL (ref 0.1–0.9)
Monocytes: 8 %
Neutrophils Absolute: 3.8 10*3/uL (ref 1.4–7.0)
Neutrophils: 58 %
Platelets: 224 10*3/uL (ref 150–450)
RBC: 4.04 x10E6/uL — ABNORMAL LOW (ref 4.14–5.80)
RDW: 14.2 % (ref 11.6–15.4)
WBC: 6.6 10*3/uL (ref 3.4–10.8)

## 2018-11-13 LAB — COMPREHENSIVE METABOLIC PANEL
ALT: 17 IU/L (ref 0–44)
AST: 18 IU/L (ref 0–40)
Albumin/Globulin Ratio: 1.9 (ref 1.2–2.2)
Albumin: 4.3 g/dL (ref 3.7–4.7)
Alkaline Phosphatase: 104 IU/L (ref 39–117)
BUN/Creatinine Ratio: 13 (ref 10–24)
BUN: 14 mg/dL (ref 8–27)
Bilirubin Total: 0.4 mg/dL (ref 0.0–1.2)
CO2: 24 mmol/L (ref 20–29)
Calcium: 8.8 mg/dL (ref 8.6–10.2)
Chloride: 101 mmol/L (ref 96–106)
Creatinine, Ser: 1.12 mg/dL (ref 0.76–1.27)
GFR calc Af Amer: 76 mL/min/{1.73_m2} (ref >59)
GFR calc non Af Amer: 66 mL/min/{1.73_m2} (ref >59)
Globulin, Total: 2.3 g/dL (ref 1.5–4.5)
Glucose: 102 mg/dL — ABNORMAL HIGH (ref 65–99)
Potassium: 4.5 mmol/L (ref 3.5–5.2)
Sodium: 141 mmol/L (ref 134–144)
Total Protein: 6.6 g/dL (ref 6.0–8.5)

## 2018-11-13 LAB — CARBAMAZEPINE LEVEL, TOTAL: Carbamazepine (Tegretol), S: 3.7 ug/mL — ABNORMAL LOW (ref 4.0–12.0)

## 2018-11-13 MED ORDER — CARBAMAZEPINE 200 MG PO TABS: ORAL_TABLET | ORAL | 5 refills | Status: DC

## 2018-11-13 NOTE — Telephone Encounter (Signed)
I called the patient.  His carbamazepine level resulted at 3.7, allowing for increase in the medication.  We will go up on the dose.  He will take carbamazepine 200 mg tablet, 1/2 tablet in the morning, 1 tablet in the evening.  I will send a new prescription.  Other lab work was unremarkable.

## 2019-05-14 ENCOUNTER — Other Ambulatory Visit: Payer: Self-pay | Admitting: Neurology

## 2019-05-18 ENCOUNTER — Other Ambulatory Visit: Payer: Self-pay

## 2019-05-18 ENCOUNTER — Ambulatory Visit: Payer: Medicare PPO | Admitting: Neurology

## 2019-05-18 ENCOUNTER — Encounter: Payer: Self-pay | Admitting: Neurology

## 2019-05-18 VITALS — BP 124/74 | HR 81 | Temp 96.8°F | Ht 70.0 in | Wt 244.2 lb

## 2019-05-18 DIAGNOSIS — G603 Idiopathic progressive neuropathy: Secondary | ICD-10-CM | POA: Diagnosis not present

## 2019-05-18 DIAGNOSIS — G2581 Restless legs syndrome: Secondary | ICD-10-CM | POA: Diagnosis not present

## 2019-05-18 MED ORDER — CARBAMAZEPINE 200 MG PO TABS: ORAL_TABLET | ORAL | 5 refills | Status: DC

## 2019-05-18 NOTE — Progress Notes (Signed)
I have read the note, and I agree with the clinical assessment and plan.  Vianne Grieshop K Anastasija Anfinson   

## 2019-05-18 NOTE — Patient Instructions (Signed)
Let's try higher dose of gabapentin for a few weeks, taking 800 mg, 4 times a day. Increase carbamazepine 200 mg twice daily. If your pain does not improve, let me know, may consider switch to Lyrica.

## 2019-05-18 NOTE — Progress Notes (Signed)
PATIENT: Jason [name] DOB: 26-Feb-1946  REASON FOR VISIT: follow up HISTORY FROM: patient  HISTORY OF PRESENT ILLNESS: Today 05/18/19  [name] is a 74 year old male with history of diabetes, small fiber neuropathy, and mild memory disturbance.  He also has restless leg syndrome. The neuropathy pain has worsened since last seen.  At times he may have lancinating pains, is taking carbamazepine.  His dose of carbamazepine was increased after last visit, it has been helpful for the "bee sting" pains.  He complains of constant burning in his feet all day, every day, to the point he has gotten used to it.  His symptoms are worse in the evening, burning, "bee sting" pains.  He is having to sleep in recliner.  He has now resumed CPAP use.  He reports his diabetes remains borderline.  His restless leg symptoms come and go. His left 3-5 toes are painful, he had to cut holes in his shoes. At night his feet turn red.  He has had a few falls, as result of losing his balance.  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  He does wonder if he needs to get a pain management for his symptoms.  He presents today for evaluation unaccompanied.  HISTORY 11/12/2018 SS: [name] is a 74 year old male with history of diabetes, small fiber neuropathy, and mild memory disturbance.  He has a significant moderate discomfort up to his knees bilaterally.  At times he may have lancinating pains.  He has difficulty sleeping as result of his restless leg syndrome.  He remains on Requip taking 2 mg tablet, 1 tablet twice daily, and 2 at night.  He also takes carbamazepine 200 mg tablet, half a tablet twice daily, along with Cymbalta 60 mg twice daily, and gabapentin 800 mg 3 times daily.  His last memory score was 28/30.  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 He has seen good benefit with the carbamazepine. The pains come and go. He hasn't been doing much after his  surgery. He has good and bad days with the pain in his legs. He says he doesn't sleep much lately. He hasn't been very tired at night lately. The numbness pain comes up higher in his legs. His diabetes is under good control, and his doctor is considering taking him off medication.  He needs a new CPAP machine. He needs to talk with his primary care doctor. He has not had any falls, but uses a cane.  He said he had 1 lightheaded spell while visiting his sister, but he had not eaten.  He went home and ate and felt much better.  He lives with his wife and drives a car. He presents today for follow-up unaccompanied.    REVIEW OF SYSTEMS: Out of a complete 14 system review of symptoms, the patient complains only of the following symptoms, and all other reviewed systems are negative.  Burning, numbness  ALLERGIES: Allergies  Allergen Reactions  . Augmentin [Amoxicillin-Pot Clavulanate] Nausea Only    Did it involve swelling of the face/tongue/throat, SOB, or low BP? No Did it involve sudden or severe rash/hives, skin peeling, or any reaction on the inside of your mouth or nose? No Did you need to seek medical attention at a hospital or doctor's office? No When did it last happen?15-20 years ago If all above answers are "NO", may proceed with cephalosporin use.   Marland Kitchen Penicillins Nausea Only  Did it involve swelling of the face/tongue/throat, SOB, or low BP? No Did it involve sudden or severe rash/hives, skin peeling, or any reaction on the inside of your mouth or nose? No Did you need to seek medical attention at a hospital or doctor's office? No When did it last happen?10-15 years ago If all above answers are "NO", may proceed with cephalosporin use.      HOME MEDICATIONS: Outpatient Medications Prior to Visit  Medication Sig Dispense Refill  . aspirin EC 81 MG tablet Take 81 mg by mouth every evening.    . carbamazepine (TEGRETOL) 200 MG tablet Take 1/2 tablet in the morning, take 1 tablet  in the evening 50 tablet 5  . [employer] FIBER GUMMIES PO Take 2 tablets by mouth daily.    . DULoxetine (CYMBALTA) 60 MG capsule TAKE ONE CAPSULE BY MOUTH TWICE DAILY 180 capsule 3  . ELDERBERRY PO Take 2 tablets by mouth daily. Gummies    . gabapentin (NEURONTIN) 800 MG tablet TAKE 1 TABLET BY MOUTH THREE TIMES DAILY 270 tablet 2  . metFORMIN (GLUCOPHAGE) 500 MG tablet Take 500 mg by mouth 2 (two) times daily with a meal.    . metoprolol tartrate (LOPRESSOR) 25 MG tablet Take 25 mg by mouth every evening.     . Multiple Vitamins-Minerals (ADULT GUMMY PO) Take 2 tablets by mouth daily.    Marland Kitchen oxybutynin (DITROPAN) 5 MG tablet Take 1 tablet (5 mg total) by mouth 3 (three) times daily. 15 tablet 3  . pantoprazole (PROTONIX) 40 MG tablet Take 40 mg by mouth daily.    Vladimir Faster Glycol-Propyl Glycol (LUBRICANT EYE DROPS) 0.4-0.3 % SOLN Place 1 drop into both eyes 3 (three) times daily as needed (dry eyes).     . promethazine (PHENERGAN) 25 MG tablet Take 12.5 mg by mouth every 6 (six) hours as needed for nausea.    Marland Kitchen rOPINIRole (REQUIP) 2 MG tablet TAKE 1 TABLET BY MOUTH TWICE DAILY and TAKE TWO TABLETS AT BEDTIME 360 tablet 1  . tamsulosin (FLOMAX) 0.4 MG CAPS capsule Take 0.4 mg by mouth daily.    Marland Kitchen HYDROcodone-acetaminophen (NORCO) 5-325 MG tablet Take 1 tablet by mouth every 4 (four) hours as needed for moderate pain. 10 tablet 0   No facility-administered medications prior to visit.    PAST MEDICAL HISTORY: Past Medical History:  Diagnosis Date  . Diabetes mellitus without complication ([place])   . GERD (gastroesophageal reflux disease)   . History of anemia    Childhood  . History of cataract   . History of kidney stones   . History of migraine    resolved  . Hypertension    states under control with med., has been on med. x 5 yr.  . Insomnia   . Low blood pressure    x3 hospitalized once for it  . Memory difficulty 05/09/2018  . MRSA (methicillin resistant staph aureus) culture positive  09/03/2018  . Paresthesia 04/27/2015  . Peripheral neuropathy 11/06/2017  . RLS (restless legs syndrome) 04/27/2015  . Seasonal allergies   . Sleep apnea    no CPAP use, REPORTS HE NEEDS A NEW ONE   . Staph aureus infection 09/03/2018  . TIA (transient ischemic attack)   . Unilateral osteoarthritis of knee 12/2014   left    PAST SURGICAL HISTORY: Past Surgical History:  Procedure Laterality Date  . CATARACT EXTRACTION, BILATERAL    . COLONOSCOPY    . CYSTOSCOPY WITH URETEROSCOPY AND STENT PLACEMENT Bilateral 09/08/2018  Procedure: CYSTOSCOPY WITH BILATERAL  URETEROSCOPY HOLMIUM LASER AND STENT PLACEMENT;  Surgeon: Lucas Mallow, MD;  Location: [facility];  Service: Urology;  Laterality: Bilateral;  . LAPAROSCOPIC NEPHRECTOMY, HAND ASSISTED Right 10/15/2018   Procedure: HAND ASSISTED LAPAROSCOPIC NEPHRECTOMY;  Surgeon: Lucas Mallow, MD;  Location: [facility];  Service: Urology;  Laterality: Right;  . PARTIAL KNEE ARTHROPLASTY Right 06/02/2013   Procedure: RIGHT UNICOMPARTMENTAL KNEE;  Surgeon: Johnny Bridge, MD;  Location: [facility];  Service: Orthopedics;  Laterality: Right;  . PARTIAL KNEE ARTHROPLASTY Left 12/17/2014   Procedure: LEFT KNEE UNI ARTHROPLASTY ;  Surgeon: Marchia Bond, MD;  Location: [facility];  Service: Orthopedics;  Laterality: Left;  . TONSILLECTOMY  1950's  . UPPER GI ENDOSCOPY      FAMILY HISTORY: Family History  Problem Relation Age of Onset  . Emphysema Father        Died age 9  . COPD Father   . Sudden death Mother        Died age 43  . CAD Brother        Died age 1  . Hypertension Brother     SOCIAL HISTORY: Social History   Socioeconomic History  . Marital status: Married    Spouse name: Not on file  . Number of children: 2  . Years of education: 76  . Highest education level: Not on file  Occupational History  . Occupation: self-employed  Tobacco Use  . Smoking status: Former Smoker    Types: Pipe  . Smokeless tobacco: Never  Used  . Tobacco comment: 06/02/2013   Substance and Sexual Activity  . Alcohol use: Yes    Comment: occasionally  . Drug use: No  . Sexual activity: Yes  Other Topics Concern  . Not on file  Social History Narrative   Retiring from [employer].     Patient drinks 1 cup of caffeine daily.   Patient is right handed.    Social Determinants of Health   Financial Resource Strain:   . Difficulty of Paying Living Expenses: Not on file  Food Insecurity:   . Worried About Charity fundraiser in the Last Year: Not on file  . Ran Out of Food in the Last Year: Not on file  Transportation Needs:   . Lack of Transportation (Medical): Not on file  . Lack of Transportation (Non-Medical): Not on file  Physical Activity:   . Days of Exercise per Week: Not on file  . Minutes of Exercise per Session: Not on file  Stress:   . Feeling of Stress : Not on file  Social Connections:   . Frequency of Communication with Friends and Family: Not on file  . Frequency of Social Gatherings with Friends and Family: Not on file  . Attends Religious Services: Not on file  . Active Member of Clubs or Organizations: Not on file  . Attends Archivist Meetings: Not on file  . Marital Status: Not on file  Intimate Partner Violence:   . Fear of Current or Ex-Partner: Not on file  . Emotionally Abused: Not on file  . Physically Abused: Not on file  . Sexually Abused: Not on file   PHYSICAL EXAM  Vitals:   05/18/19 0945  BP: 124/74  Pulse: 81  Temp: (!) 96.8 F (36 C)  Weight: 244 lb 3.2 oz (110.8 kg)  Height: 5\' 10"  (1.778 m)   Body mass index is 35.04 kg/m.  Generalized: Well developed,  in no acute distress  MMSE - Mini Mental State Exam 05/09/2018 11/06/2017  Orientation to time 5 4  Orientation to Place 5 5  Registration 3 3  Attention/ Calculation 5 5  Recall 1 2  Language- name 2 objects 2 2  Language- repeat 1 1  Language- follow 3 step command 3 3  Language- read & follow direction 1 1    Write a sentence 1 1  Copy design 1 1  Total score 28 28    Neurological examination  Mentation: Alert oriented to time, place, history taking. Follows all commands speech and language fluent Cranial nerve II-XII: Pupils were equal round reactive to light. Extraocular movements were full, visual field were full on confrontational test. Facial sensation and strength were normal.  Head turning and shoulder shrug  were normal and symmetric. Motor: The motor testing reveals 5 over 5 strength of all 4 extremities. Good symmetric motor tone is noted throughout.  Sensory: Stocking pattern sensory deficit to pinprick Coordination: Cerebellar testing reveals good finger-nose-finger and heel-to-shin bilaterally.  Gait and station: Gait is normal. Tandem gait is mildly impaired Reflexes: Deep tendon reflexes are symmetric    DIAGNOSTIC DATA (LABS, IMAGING, TESTING) - I reviewed patient records, labs, notes, testing and imaging myself where available.  Lab Results  Component Value Date   WBC 6.6 11/12/2018   HGB 12.8 (L) 11/12/2018   HCT 37.4 (L) 11/12/2018   MCV 93 11/12/2018   PLT 224 11/12/2018      Component Value Date/Time   NA 141 11/12/2018 0926   K 4.5 11/12/2018 0926   CL 101 11/12/2018 0926   CO2 24 11/12/2018 0926   GLUCOSE 102 (H) 11/12/2018 0926   GLUCOSE 111 (H) 10/16/2018 0426   BUN 14 11/12/2018 0926   CREATININE 1.12 11/12/2018 0926   CALCIUM 8.8 11/12/2018 0926   PROT 6.6 11/12/2018 0926   ALBUMIN 4.3 11/12/2018 0926   AST 18 11/12/2018 0926   ALT 17 11/12/2018 0926   ALKPHOS 104 11/12/2018 0926   BILITOT 0.4 11/12/2018 0926   GFRNONAA 66 11/12/2018 0926   GFRAA 76 11/12/2018 0926   No results found for: CHOL, HDL, LDLCALC, LDLDIRECT, TRIG, CHOLHDL Lab Results  Component Value Date   HGBA1C 6.3 (H) 09/03/2018   Lab Results  Component Value Date   [id number] 04/08/2017   No results found for: TSH    ASSESSMENT AND PLAN 74 y.o. year old male  has a  past medical history of Diabetes mellitus without complication ([place]), GERD (gastroesophageal reflux disease), History of anemia, History of cataract, History of kidney stones, History of migraine, Hypertension, Insomnia, Low blood pressure, Memory difficulty (05/09/2018), MRSA (methicillin resistant staph aureus) culture positive (09/03/2018), Paresthesia (04/27/2015), Peripheral neuropathy (11/06/2017), RLS (restless legs syndrome) (04/27/2015), Seasonal allergies, Sleep apnea, Staph aureus infection (09/03/2018), TIA (transient ischemic attack), and Unilateral osteoarthritis of knee (12/2014). here with:  1.  Restless leg syndrome 2.  Peripheral neuropathy, small fiber neuropathy  He complains of worsening pain, difficulty sleeping.  I will increase his carbamazepine to 200 mg twice a day to help with the lancinating pains.  He will remain on Cymbalta and Requip.  He discussed the importance of resuming CPAP, will discuss with PCP.  It seems he is no longer benefiting from gabapentin as well as he used to.  We discussed options, I think he may benefit better with Lyrica.  He wishes to try a slightly higher dose of gabapentin, he will take gabapentin 800 mg  4 times a day for a few weeks, along with a higher dose of carbamazepine.  If this is not effective.  I will switch him to Lyrica, will stop gabapentin, the next day start Lyrica 200 mg twice a day. He will follow-up in 4 months, to ensure his symptoms have improved.  At that time, we should check a memory test.  I spent 25 minutes with the patient. 50% of this time was spent discussing his plan of care.   Butler Denmark, AGNP-C, DNP 05/18/2019, 9:54 AM [facility] [address] [place], [place] [address] [phone]

## 2019-09-14 ENCOUNTER — Encounter: Payer: Self-pay | Admitting: Neurology

## 2019-09-14 ENCOUNTER — Ambulatory Visit: Payer: Medicare PPO | Admitting: Neurology

## 2019-09-14 ENCOUNTER — Other Ambulatory Visit: Payer: Self-pay

## 2019-09-14 VITALS — BP 135/89 | HR 74 | Ht 70.0 in | Wt 243.0 lb

## 2019-09-14 DIAGNOSIS — G4730 Sleep apnea, unspecified: Secondary | ICD-10-CM | POA: Diagnosis not present

## 2019-09-14 DIAGNOSIS — G603 Idiopathic progressive neuropathy: Secondary | ICD-10-CM | POA: Diagnosis not present

## 2019-09-14 DIAGNOSIS — G2581 Restless legs syndrome: Secondary | ICD-10-CM | POA: Diagnosis not present

## 2019-09-14 MED ORDER — PREGABALIN 200 MG PO CAPS: 200.0000 mg | ORAL_CAPSULE | Freq: Two times a day (BID) | ORAL | 3 refills | Status: DC

## 2019-09-14 MED ORDER — CARBAMAZEPINE 200 MG PO TABS: ORAL_TABLET | ORAL | 5 refills | Status: DC

## 2019-09-14 MED ORDER — ROPINIROLE HCL 2 MG PO TABS: ORAL_TABLET | ORAL | 1 refills | Status: DC

## 2019-09-14 NOTE — Patient Instructions (Signed)
Stop the gabapentin  Start Lyrica 200 mg twice daily Continue other medications  Check blood work today  See you back in 6 months

## 2019-09-14 NOTE — Progress Notes (Signed)
PATIENT: Jason [name] DOB: 1945/11/21  REASON FOR VISIT: follow up HISTORY FROM: patient  HISTORY OF PRESENT ILLNESS: Today 09/14/19  Jason [name] is a 74 year old male with history of diabetes, small fiber neuropathy, and mild memory disturbance.  He also has restless leg syndrome.  At times he may have lancinating pains, is on carbamazepine and gabapentin. He is also taking Cymbalta and Requip.  At last visit, we increased gabapentin 800 mg 4 times a day.  Has not noticed any change.  The lancinating pains have improved with carbamazepine.  He has chronic burning in his feet, is worse at night, he does not sleep well.  Has history of OSA, has CPAP, but indicates his machine is at least 73 years old, the equipment does not work well, and he cannot use it.  He has had a few falls.  He uses a cane as needed.  He usually has to sleep in a recliner at night.  His diabetes remains borderline, but has not had his A1c checked in quite a while.  He also may have some numbness in his hands.  He presents today for evaluation accompanied by his wife.  HISTORY 05/18/2019 SS: Jason [name] is a 74 year old male with history of diabetes, small fiber neuropathy, and mild memory disturbance.  He also has restless leg syndrome. The neuropathy pain has worsened since last seen.  At times he may have lancinating pains, is taking carbamazepine.  His dose of carbamazepine was increased after last visit, it has been helpful for the "bee sting" pains.  He complains of constant burning in his feet all day, every day, to the point he has gotten used to it.  His symptoms are worse in the evening, burning, "bee sting" pains.  He is having to sleep in recliner.  He has now resumed CPAP use.  He reports his diabetes remains borderline.  His restless leg symptoms come and go. His left 3-5 toes are painful, he had to cut holes in his shoes. At night his feet turn red.  He has had a few falls, as result of losing his balance.  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  He does wonder if he needs to get a pain management for his symptoms.  He presents today for evaluation unaccompanied.   REVIEW OF SYSTEMS: Out of a complete 14 system review of symptoms, the patient complains only of the following symptoms, and all other reviewed systems are negative.  Numbness  ALLERGIES: Allergies  Allergen Reactions  . Augmentin [Amoxicillin-Pot Clavulanate] Nausea Only    Did it involve swelling of the face/tongue/throat, SOB, or low BP? No Did it involve sudden or severe rash/hives, skin peeling, or any reaction on the inside of your mouth or nose? No Did you need to seek medical attention at a hospital or doctor's office? No When did it last happen?15-20 years ago If all above answers are "NO", may proceed with cephalosporin use.   Marland Kitchen Penicillins Nausea Only    Did it involve swelling of the face/tongue/throat, SOB, or low BP? No Did it involve sudden or severe rash/hives, skin peeling, or any reaction on the inside of your mouth or nose? No Did you need to seek medical attention at a hospital or doctor's office? No When did it last happen?10-15 years ago If all above answers are "NO", may proceed with cephalosporin use.      HOME MEDICATIONS: Outpatient Medications Prior to Visit  Medication Sig Dispense  Refill  . aspirin EC 81 MG tablet Take 81 mg by mouth every evening.    . [employer] FIBER GUMMIES PO Take 2 tablets by mouth daily.    . DULoxetine (CYMBALTA) 60 MG capsule TAKE ONE CAPSULE BY MOUTH TWICE DAILY 180 capsule 3  . ELDERBERRY PO Take 2 tablets by mouth daily. Gummies    . lisinopril (ZESTRIL) 2.5 MG tablet     . metFORMIN (GLUCOPHAGE) 500 MG tablet Take 500 mg by mouth 2 (two) times daily with a meal.    . metoprolol tartrate (LOPRESSOR) 25 MG tablet Take 25 mg by mouth every evening.     . Multiple Vitamins-Minerals (ADULT GUMMY PO) Take 2 tablets by mouth daily.    Marland Kitchen oxybutynin (DITROPAN) 5 MG  tablet Take 1 tablet (5 mg total) by mouth 3 (three) times daily. 15 tablet 3  . pantoprazole (PROTONIX) 40 MG tablet Take 40 mg by mouth daily.    Vladimir Faster Glycol-Propyl Glycol (LUBRICANT EYE DROPS) 0.4-0.3 % SOLN Place 1 drop into both eyes 3 (three) times daily as needed (dry eyes).     . promethazine (PHENERGAN) 25 MG tablet Take 12.5 mg by mouth every 6 (six) hours as needed for nausea.    . rosuvastatin (CRESTOR) 10 MG tablet     . tamsulosin (FLOMAX) 0.4 MG CAPS capsule Take 0.4 mg by mouth daily.    . carbamazepine (TEGRETOL) 200 MG tablet Take 1 tablet twice daily 60 tablet 5  . gabapentin (NEURONTIN) 800 MG tablet TAKE 1 TABLET BY MOUTH THREE TIMES DAILY (Patient taking differently: in the morning, at noon, in the evening, and at bedtime. ) 270 tablet 2  . rOPINIRole (REQUIP) 2 MG tablet TAKE 1 TABLET BY MOUTH TWICE DAILY and TAKE TWO TABLETS AT BEDTIME 360 tablet 1   No facility-administered medications prior to visit.    PAST MEDICAL HISTORY: Past Medical History:  Diagnosis Date  . Diabetes mellitus without complication ([place])   . GERD (gastroesophageal reflux disease)   . History of anemia    Childhood  . History of cataract   . History of kidney stones   . History of migraine    resolved  . Hypertension    states under control with med., has been on med. x 5 yr.  . Insomnia   . Low blood pressure    x3 hospitalized once for it  . Memory difficulty 05/09/2018  . MRSA (methicillin resistant staph aureus) culture positive 09/03/2018  . Paresthesia 04/27/2015  . Peripheral neuropathy 11/06/2017  . RLS (restless legs syndrome) 04/27/2015  . Seasonal allergies   . Sleep apnea    no CPAP use, REPORTS HE NEEDS A NEW ONE   . Staph aureus infection 09/03/2018  . TIA (transient ischemic attack)   . Unilateral osteoarthritis of knee 12/2014   left    PAST SURGICAL HISTORY: Past Surgical History:  Procedure Laterality Date  . CATARACT EXTRACTION, BILATERAL    . COLONOSCOPY     . CYSTOSCOPY WITH URETEROSCOPY AND STENT PLACEMENT Bilateral 09/08/2018   Procedure: CYSTOSCOPY WITH BILATERAL  URETEROSCOPY HOLMIUM LASER AND STENT PLACEMENT;  Surgeon: Lucas Mallow, MD;  Location: [facility];  Service: Urology;  Laterality: Bilateral;  . LAPAROSCOPIC NEPHRECTOMY, HAND ASSISTED Right 10/15/2018   Procedure: HAND ASSISTED LAPAROSCOPIC NEPHRECTOMY;  Surgeon: Lucas Mallow, MD;  Location: [facility];  Service: Urology;  Laterality: Right;  . PARTIAL KNEE ARTHROPLASTY Right 06/02/2013   Procedure: RIGHT UNICOMPARTMENTAL KNEE;  Surgeon:  Johnny Bridge, MD;  Location: [facility];  Service: Orthopedics;  Laterality: Right;  . PARTIAL KNEE ARTHROPLASTY Left 12/17/2014   Procedure: LEFT KNEE UNI ARTHROPLASTY ;  Surgeon: Marchia Bond, MD;  Location: [facility];  Service: Orthopedics;  Laterality: Left;  . TONSILLECTOMY  1950's  . UPPER GI ENDOSCOPY      FAMILY HISTORY: Family History  Problem Relation Age of Onset  . Emphysema Father        Died age 35  . COPD Father   . Sudden death Mother        Died age 34  . CAD Brother        Died age 93  . Hypertension Brother     SOCIAL HISTORY: Social History   Socioeconomic History  . Marital status: Married    Spouse name: Not on file  . Number of children: 2  . Years of education: 78  . Highest education level: Not on file  Occupational History  . Occupation: self-employed  Tobacco Use  . Smoking status: Former Smoker    Types: Pipe  . Smokeless tobacco: Never Used  . Tobacco comment: 06/02/2013   Vaping Use  . Vaping Use: Never used  Substance and Sexual Activity  . Alcohol use: Yes    Comment: occasionally  . Drug use: No  . Sexual activity: Yes  Other Topics Concern  . Not on file  Social History Narrative   Retiring from [employer].     Patient drinks 1 cup of caffeine daily.   Patient is right handed.    Social Determinants of Health   Financial Resource Strain:   . Difficulty of Paying Living  Expenses:   Food Insecurity:   . Worried About Charity fundraiser in the Last Year:   . Arboriculturist in the Last Year:   Transportation Needs:   . Film/video editor (Medical):   Marland Kitchen Lack of Transportation (Non-Medical):   Physical Activity:   . Days of Exercise per Week:   . Minutes of Exercise per Session:   Stress:   . Feeling of Stress :   Social Connections:   . Frequency of Communication with Friends and Family:   . Frequency of Social Gatherings with Friends and Family:   . Attends Religious Services:   . Active Member of Clubs or Organizations:   . Attends Archivist Meetings:   Marland Kitchen Marital Status:   Intimate Partner Violence:   . Fear of Current or Ex-Partner:   . Emotionally Abused:   Marland Kitchen Physically Abused:   . Sexually Abused:    PHYSICAL EXAM  Vitals:   09/14/19 1427  BP: 135/89  Pulse: 74  Weight: 243 lb (110.2 kg)  Height: 5\' 10"  (1.778 m)   Body mass index is 34.87 kg/m.  Generalized: Well developed, in no acute distress   Neurological examination  Mentation: Alert oriented to time, place, history taking. Follows all commands speech and language fluent Cranial nerve II-XII: Pupils were equal round reactive to light. Extraocular movements were full, visual field were full on confrontational test. Facial sensation and strength were normal. Head turning and shoulder shrug were normal and symmetric. Motor: The motor testing reveals 5 over 5 strength of all 4 extremities. Good symmetric motor tone is noted throughout.  Sensory: Decreased soft touch, vibration, pinprick sensation up to knees bilaterally Coordination: Cerebellar testing reveals good finger-nose-finger and heel-to-shin bilaterally.  Gait and station: Gait is wide-based, cautious, cannot perform  tandem gait, able to walk on heels and tiptoe, Romberg is negative. Reflexes: Deep tendon reflexes are symmetric   DIAGNOSTIC DATA (LABS, IMAGING, TESTING) - I reviewed patient records, labs,  notes, testing and imaging myself where available.  Lab Results  Component Value Date   WBC 6.6 11/12/2018   HGB 12.8 (L) 11/12/2018   HCT 37.4 (L) 11/12/2018   MCV 93 11/12/2018   PLT 224 11/12/2018      Component Value Date/Time   NA 141 11/12/2018 0926   K 4.5 11/12/2018 0926   CL 101 11/12/2018 0926   CO2 24 11/12/2018 0926   GLUCOSE 102 (H) 11/12/2018 0926   GLUCOSE 111 (H) 10/16/2018 0426   BUN 14 11/12/2018 0926   CREATININE 1.12 11/12/2018 0926   CALCIUM 8.8 11/12/2018 0926   PROT 6.6 11/12/2018 0926   ALBUMIN 4.3 11/12/2018 0926   AST 18 11/12/2018 0926   ALT 17 11/12/2018 0926   ALKPHOS 104 11/12/2018 0926   BILITOT 0.4 11/12/2018 0926   GFRNONAA 66 11/12/2018 0926   GFRAA 76 11/12/2018 0926   No results found for: CHOL, HDL, LDLCALC, LDLDIRECT, TRIG, CHOLHDL Lab Results  Component Value Date   HGBA1C 6.3 (H) 09/03/2018   Lab Results  Component Value Date   [id number] 076 04/08/2017   No results found for: TSH   ASSESSMENT AND PLAN 74 y.o. year old male  has a past medical history of Diabetes mellitus without complication ([place]), GERD (gastroesophageal reflux disease), History of anemia, History of cataract, History of kidney stones, History of migraine, Hypertension, Insomnia, Low blood pressure, Memory difficulty (05/09/2018), MRSA (methicillin resistant staph aureus) culture positive (09/03/2018), Paresthesia (04/27/2015), Peripheral neuropathy (11/06/2017), RLS (restless legs syndrome) (04/27/2015), Seasonal allergies, Sleep apnea, Staph aureus infection (09/03/2018), TIA (transient ischemic attack), and Unilateral osteoarthritis of knee (12/2014). here with:  1.  Peripheral neuropathy, small fiber neuropathy 2.  Restless leg syndrome 3.  OSA, not using CPAP  He continues to have difficulty controlling his pain.  He will stop gabapentin.  He will switch to Lyrica 200 mg twice a day.  He will remain on carbamazepine 200 mg twice a day-has been quite helpful for  lancinating pain.  I will recheck routine blood work, along with A1c, make sure has not significantly worsened.  I will refer him for sleep evaluation, as his OSA is currently not treated, he has old equipment that does not function, he suffers from daytime drowsiness.  He will continue Cymbalta and Requip.  He will follow-up in 6 months or sooner if needed.  He will provide a progress report of how he is tolerating Lyrica. In the future, may need to consider increase of carbamazepine if the level allows, even addition of Keppra.    I spent 30 minutes of face-to-face and non-face-to-face time with patient.  This included previsit chart review, lab review, study review, order entry, electronic health record documentation, patient education.    Butler Denmark, AGNP-C, DNP 09/14/2019, 3:21 PM [facility] [address], [place], [place] [address] [phone]

## 2019-09-15 ENCOUNTER — Ambulatory Visit: Payer: Medicare PPO | Admitting: Neurology

## 2019-09-15 LAB — CBC WITH DIFFERENTIAL/PLATELET
Basophils Absolute: 0.1 10*3/uL (ref 0.0–0.2)
Basos: 1 %
EOS (ABSOLUTE): 0.6 10*3/uL — ABNORMAL HIGH (ref 0.0–0.4)
Eos: 8 %
Hematocrit: 40.3 % (ref 37.5–51.0)
Hemoglobin: 13.9 g/dL (ref 13.0–17.7)
Immature Grans (Abs): 0 10*3/uL (ref 0.0–0.1)
Immature Granulocytes: 0 %
Lymphocytes Absolute: 1.9 10*3/uL (ref 0.7–3.1)
Lymphs: 26 %
MCH: 32.9 pg (ref 26.6–33.0)
MCHC: 34.5 g/dL (ref 31.5–35.7)
MCV: 95 fL (ref 79–97)
Monocytes Absolute: 0.7 10*3/uL (ref 0.1–0.9)
Monocytes: 9 %
Neutrophils Absolute: 4.2 10*3/uL (ref 1.4–7.0)
Neutrophils: 56 %
Platelets: 231 10*3/uL (ref 150–450)
RBC: 4.23 x10E6/uL (ref 4.14–5.80)
RDW: 13.5 % (ref 11.6–15.4)
WBC: 7.5 10*3/uL (ref 3.4–10.8)

## 2019-09-15 LAB — COMPREHENSIVE METABOLIC PANEL
ALT: 19 IU/L (ref 0–44)
AST: 23 IU/L (ref 0–40)
Albumin/Globulin Ratio: 1.7 (ref 1.2–2.2)
Albumin: 4.6 g/dL (ref 3.7–4.7)
Alkaline Phosphatase: 128 IU/L — ABNORMAL HIGH (ref 48–121)
BUN/Creatinine Ratio: 18 (ref 10–24)
BUN: 19 mg/dL (ref 8–27)
Bilirubin Total: 0.3 mg/dL (ref 0.0–1.2)
CO2: 22 mmol/L (ref 20–29)
Calcium: 9.3 mg/dL (ref 8.6–10.2)
Chloride: 101 mmol/L (ref 96–106)
Creatinine, Ser: 1.06 mg/dL (ref 0.76–1.27)
GFR calc Af Amer: 81 mL/min/{1.73_m2} (ref >59)
GFR calc non Af Amer: 70 mL/min/{1.73_m2} (ref >59)
Globulin, Total: 2.7 g/dL (ref 1.5–4.5)
Glucose: 91 mg/dL (ref 65–99)
Potassium: 5.3 mmol/L — ABNORMAL HIGH (ref 3.5–5.2)
Sodium: 139 mmol/L (ref 134–144)
Total Protein: 7.3 g/dL (ref 6.0–8.5)

## 2019-09-15 LAB — HEMOGLOBIN A1C
Est. average glucose Bld gHb Est-mCnc: 143 mg/dL
Hgb A1c MFr Bld: 6.6 % — ABNORMAL HIGH (ref 4.8–5.6)

## 2019-09-15 LAB — CARBAMAZEPINE LEVEL, TOTAL: Carbamazepine (Tegretol), S: 6.1 ug/mL (ref 4.0–12.0)

## 2019-09-15 NOTE — Progress Notes (Signed)
I have read the note, and I agree with the clinical assessment and plan.  Amar Keenum K Johanny Segers   

## 2019-09-16 ENCOUNTER — Telehealth: Payer: Self-pay

## 2019-09-16 NOTE — Telephone Encounter (Signed)
-----   Message from Suzzanne Cloud, NP sent at 09/15/2019  8:44 AM EDT ----- A1C is slightly elevated at 6.6, other labs show no significant abnormality. Potassium was slightly elevated at 5.3, could be related to specimen collection. Please send labs to his primary doctor. He needs to schedule follow up, potassium can be repeated with PCP, needs evaluation of A1C.

## 2019-09-16 NOTE — Telephone Encounter (Signed)
Pt verified by name and DOB, results given per provider, pt voiced understanding all question answered.   Labs faxed to pcp

## 2019-10-01 ENCOUNTER — Ambulatory Visit: Payer: Medicare PPO | Admitting: Neurology

## 2019-10-01 ENCOUNTER — Other Ambulatory Visit: Payer: Self-pay

## 2019-10-01 ENCOUNTER — Encounter: Payer: Self-pay | Admitting: Neurology

## 2019-10-01 VITALS — BP 112/80 | HR 79 | Ht 70.0 in | Wt 239.0 lb

## 2019-10-01 DIAGNOSIS — G2581 Restless legs syndrome: Secondary | ICD-10-CM

## 2019-10-01 DIAGNOSIS — G4719 Other hypersomnia: Secondary | ICD-10-CM | POA: Diagnosis not present

## 2019-10-01 DIAGNOSIS — R413 Other amnesia: Secondary | ICD-10-CM | POA: Diagnosis not present

## 2019-10-01 DIAGNOSIS — E669 Obesity, unspecified: Secondary | ICD-10-CM

## 2019-10-01 DIAGNOSIS — G4733 Obstructive sleep apnea (adult) (pediatric): Secondary | ICD-10-CM

## 2019-10-01 DIAGNOSIS — R351 Nocturia: Secondary | ICD-10-CM

## 2019-10-01 NOTE — Patient Instructions (Signed)
  Here is what we discussed today and what we came up with as our plan for you:    Based on your symptoms and your exam I believe you are at risk for obstructive sleep apnea (aka OSA), and I think we should proceed with a sleep study to determine whether you do or do not have OSA and how severe it is. Even, if you have mild OSA, I may want you to consider treatment with CPAP, as treatment of even borderline or mild sleep apnea can result and improvement of symptoms such as sleep disruption, daytime sleepiness, nighttime bathroom breaks, restless leg symptoms, improvement of headache syndromes, even improved mood disorder.   Please remember, the long-term risks and ramifications of untreated moderate to severe obstructive sleep apnea are: increased Cardiovascular disease, including congestive heart failure, stroke, difficult to control hypertension, treatment resistant obesity, arrhythmias, especially irregular heartbeat commonly known as A. Fib. (atrial fibrillation); even type 2 diabetes has been linked to untreated OSA.   Sleep apnea can cause disruption of sleep and sleep deprivation in most cases, which, in turn, can cause recurrent headaches, problems with memory, mood, concentration, focus, and vigilance. Most people with untreated sleep apnea report excessive daytime sleepiness, which can affect their ability to drive. Please do not drive if you feel sleepy. Patients with sleep apnea developed difficulty initiating and maintaining sleep (aka insomnia).   Having sleep apnea may increase your risk for other sleep disorders, including involuntary behaviors sleep such as sleep terrors, sleep talking, sleepwalking.    Having sleep apnea can also increase your risk for restless leg syndrome and leg movements at night.   Please note that untreated obstructive sleep apnea may carry additional perioperative morbidity. Patients with significant obstructive sleep apnea (typically, in the moderate to severe  degree) should receive, if possible, perioperative PAP (positive airway pressure) therapy and the surgeons and particularly the anesthesiologists should be informed of the diagnosis and the severity of the sleep disordered breathing.   I will likely see you back after your sleep study to go over the test results and where to go from there. We will call you after your sleep study to advise about the results (most likely, you will hear from [place], my nurse) and to set up an appointment at the time, as necessary.    Our sleep lab administrative assistant will call you to schedule your sleep study and give you further instructions, regarding the check in process for the sleep study, arrival time, what to bring, when you can expect to leave after the study, etc., and to answer any other logistical questions you may have. If you don't hear back from her by about 2 weeks from now, please feel free to call her direct line at [phone] or you can call our general clinic number, or email [place] through My Chart.

## 2019-10-01 NOTE — Progress Notes (Signed)
Subjective:    Patient ID: Jason [name] is a 74 y.o. male.  HPI     Star Age, MD, PhD [facility] [address], [address]O. [place], [place] [address]  Dear Judson Roch,   I Jason your patient, Jason [name], upon your kind request, in my sleep clinic today For initial consultation of his sleep disorder, in particular, evaluation of his prior diagnosis of obstructive sleep apnea.  The patient is accompanied by his wife today.  As you know, Mr. Ramnauth is a 74 year old right-handed gentleman with an underlying medical history of diabetes, neuropathy, reflux disease, kidney stones, lung cancer with right kidney removal last year, hypertension, memory loss, restless leg syndrome, allergies, history of TIA, osteoarthritis and obesity, who reports a prior diagnosis of obstructive sleep apnea.  He was originally diagnosed some 20 years ago.  He tried CPAP therapy in the past but could not tolerate it.  He particularly had difficulty with a full facemask in the tubing, also the machine was loud.  He still has the machine but has not used it in years, probably 8 or 10 years.  The machine is older.  His wife sleeps in a separate bedroom but has noted pauses in his breathing and restless sleep.  He also has restless leg syndrome and takes ropinirole 2 mg twice daily.  He has been on high-dose gabapentin until recently and was switched to Lyrica for his neuropathy.  He has discomfort in the legs at night.  Symptoms of restless legs flares up at night.  He has no family history of sleep apnea but does report a family history of restless leg syndrome.  He has a variable sleep schedule, does get sleepy during the day and routinely takes a nap during the day.  He recently stopped working, he worked as a Optometrist.  He works for IT sales professional for years, had been working from home for years.  Bedtime currently is around 8 or 9, sometimes even earlier and rise time between 2 and 3 AM, sometimes he can  sleep till 5 but often wakes up in the early morning hours and may go to the recliner and may doze off again.  He has nocturia about once or twice per average night.  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  He has does not currently have a CPAP machine.  His Epworth sleepiness score is 17 out of 24, fatigue severity score is 59 out of 63.  He drinks caffeine in the form of soda, about 2 servings per day, alcohol 2-3 times per week, quit smoking the pipe many years ago.  He has 2 grown children, 1 son who has 2 kids and 1 daughter who has 4 children. They do have a TV in the bedroom and it tends to stay on at night, sometimes he turns it off and sometimes it goes up on a timer.  I reviewed your office note from 09/14/2019.  His Past Medical History Is Significant For: Past Medical History:  Diagnosis Date  . Diabetes mellitus without complication ([place])   . GERD (gastroesophageal reflux disease)   . History of anemia    Childhood  . History of cataract   . History of kidney stones   . History of migraine    resolved  . Hypertension    states under  control with med., has been on med. x 5 yr.  . Insomnia   . Low blood pressure    x3 hospitalized once for it  . Memory difficulty 05/09/2018  . MRSA (methicillin resistant staph aureus) culture positive 09/03/2018  . Paresthesia 04/27/2015  . Peripheral neuropathy 11/06/2017  . RLS (restless legs syndrome) 04/27/2015  . Seasonal allergies   . Sleep apnea    no CPAP use, REPORTS HE NEEDS A NEW ONE   . Staph aureus infection 09/03/2018  . TIA (transient ischemic attack)   . Unilateral osteoarthritis of knee 12/2014   left    His Past Surgical History Is Significant For: Past Surgical History:  Procedure Laterality Date  . CATARACT EXTRACTION, BILATERAL    . COLONOSCOPY    . CYSTOSCOPY WITH URETEROSCOPY AND  STENT PLACEMENT Bilateral 09/08/2018   Procedure: CYSTOSCOPY WITH BILATERAL  URETEROSCOPY HOLMIUM LASER AND STENT PLACEMENT;  Surgeon: Lucas Mallow, MD;  Location: [facility];  Service: Urology;  Laterality: Bilateral;  . LAPAROSCOPIC NEPHRECTOMY, HAND ASSISTED Right 10/15/2018   Procedure: HAND ASSISTED LAPAROSCOPIC NEPHRECTOMY;  Surgeon: Lucas Mallow, MD;  Location: [facility];  Service: Urology;  Laterality: Right;  . PARTIAL KNEE ARTHROPLASTY Right 06/02/2013   Procedure: RIGHT UNICOMPARTMENTAL KNEE;  Surgeon: Johnny Bridge, MD;  Location: [facility];  Service: Orthopedics;  Laterality: Right;  . PARTIAL KNEE ARTHROPLASTY Left 12/17/2014   Procedure: LEFT KNEE UNI ARTHROPLASTY ;  Surgeon: Marchia Bond, MD;  Location: [facility];  Service: Orthopedics;  Laterality: Left;  . TONSILLECTOMY  1950's  . UPPER GI ENDOSCOPY      His Family History Is Significant For: Family History  Problem Relation Age of Onset  . Emphysema Father        Died age 73  . COPD Father   . Sudden death Mother        Died age 63  . CAD Brother        Died age 44  . Hypertension Brother     His Social History Is Significant For: Social History   Socioeconomic History  . Marital status: Married    Spouse name: Not on file  . Number of children: 2  . Years of education: 23  . Highest education level: Not on file  Occupational History  . Occupation: self-employed  Tobacco Use  . Smoking status: Former Smoker    Types: Pipe  . Smokeless tobacco: Never Used  . Tobacco comment: 06/02/2013   Vaping Use  . Vaping Use: Never used  Substance and Sexual Activity  . Alcohol use: Yes    Comment: occasionally  . Drug use: No  . Sexual activity: Yes  Other Topics Concern  . Not on file  Social History Narrative   Retiring from [employer].     Patient drinks 1 cup of caffeine daily.   Patient is right handed.    Social Determinants of Health   Financial Resource Strain:   . Difficulty of Paying  Living Expenses:   Food Insecurity:   . Worried About Charity fundraiser in the Last Year:   . Arboriculturist in the Last Year:   Transportation Needs:   . Film/video editor (Medical):   Marland Kitchen Lack of Transportation (Non-Medical):   Physical Activity:   . Days of Exercise per Week:   . Minutes of Exercise per Session:   Stress:   . Feeling of Stress :   Social Connections:   .  Frequency of Communication with Friends and Family:   . Frequency of Social Gatherings with Friends and Family:   . Attends Religious Services:   . Active Member of Clubs or Organizations:   . Attends Archivist Meetings:   Marland Kitchen Marital Status:     His Allergies Are:  Allergies  Allergen Reactions  . Augmentin [Amoxicillin-Pot Clavulanate] Nausea Only    Did it involve swelling of the face/tongue/throat, SOB, or low BP? No Did it involve sudden or severe rash/hives, skin peeling, or any reaction on the inside of your mouth or nose? No Did you need to seek medical attention at a hospital or doctor's office? No When did it last happen?15-20 years ago If all above answers are "NO", may proceed with cephalosporin use.   Marland Kitchen Penicillins Nausea Only    Did it involve swelling of the face/tongue/throat, SOB, or low BP? No Did it involve sudden or severe rash/hives, skin peeling, or any reaction on the inside of your mouth or nose? No Did you need to seek medical attention at a hospital or doctor's office? No When did it last happen?10-15 years ago If all above answers are "NO", may proceed with cephalosporin use.    :   His Current Medications Are:  Outpatient Encounter Medications as of 10/01/2019  Medication Sig  . aspirin EC 81 MG tablet Take 81 mg by mouth every evening.  . carbamazepine (TEGRETOL) 200 MG tablet Take 1 tablet twice daily  . [employer] FIBER GUMMIES PO Take 2 tablets by mouth daily.  . DULoxetine (CYMBALTA) 60 MG capsule TAKE ONE CAPSULE BY MOUTH TWICE DAILY  . ELDERBERRY PO Take 2  tablets by mouth daily. Gummies  . lisinopril (ZESTRIL) 2.5 MG tablet   . metFORMIN (GLUCOPHAGE) 500 MG tablet Take 500 mg by mouth 2 (two) times daily with a meal.  . metoprolol tartrate (LOPRESSOR) 25 MG tablet Take 25 mg by mouth every evening.   . Multiple Vitamins-Minerals (ADULT GUMMY PO) Take 2 tablets by mouth daily.  Marland Kitchen oxybutynin (DITROPAN) 5 MG tablet Take 1 tablet (5 mg total) by mouth 3 (three) times daily.  . pantoprazole (PROTONIX) 40 MG tablet Take 40 mg by mouth daily.  Vladimir Faster Glycol-Propyl Glycol (LUBRICANT EYE DROPS) 0.4-0.3 % SOLN Place 1 drop into both eyes 3 (three) times daily as needed (dry eyes).   . pregabalin (LYRICA) 200 MG capsule Take 1 capsule (200 mg total) by mouth 2 (two) times daily.  . promethazine (PHENERGAN) 25 MG tablet Take 12.5 mg by mouth every 6 (six) hours as needed for nausea.  Marland Kitchen rOPINIRole (REQUIP) 2 MG tablet TAKE 1 TABLET BY MOUTH TWICE DAILY and TAKE TWO TABLETS AT BEDTIME  . rosuvastatin (CRESTOR) 10 MG tablet   . tamsulosin (FLOMAX) 0.4 MG CAPS capsule Take 0.4 mg by mouth daily.   No facility-administered encounter medications on file as of 10/01/2019.  :  Review of Systems:  Out of a complete 14 point review of systems, all are reviewed and negative with the exception of these symptoms as listed below:  Review of Systems  Neurological:       Here for sleep consult. Prior sleep study 10 + years ago. Tried cpap could not tolerate.  Epworth Sleepiness Scale 0= would never doze 1= slight chance of dozing 2= moderate chance of dozing 3= high chance of dozing  Sitting and reading:2 Watching TV:3 Sitting inactive in a public place (ex. Theater or meeting):3 As a passenger in a car  for an hour without a break:2 Lying down to rest in the afternoon:3 Sitting and talking to someone:1 Sitting quietly after lunch (no alcohol):2 In a car, while stopped in traffic:1 Total:17     Objective:  Neurological Exam  Physical Exam Physical  Examination:   Vitals:   10/01/19 1310  BP: 112/80  Pulse: 79    General Examination: The patient is a very pleasant 74 y.o. male in no acute distress. He appears well-developed and well-nourished and very well groomed.   HEENT: Normocephalic, atraumatic, pupils are equal, round and reactive to light, extraocular tracking is good without limitation to gaze excursion or nystagmus noted. Hearing is grossly intact. Face is symmetric with normal facial animation. Speech is clear with no dysarthria noted. There is no hypophonia. There is no lip, neck/head, jaw or voice tremor. Neck is supple with full range of passive and active motion. There are no carotid bruits on auscultation. Oropharynx exam reveals: moderate mouth dryness, adequate dental hygiene with multiple missing teeth, and moderate airway crowding, with larger uvula noted.  Tongue protrudes centrally and palate elevates symmetrically.  Neck circumference is 18 and three-quarter inches.    Chest: Clear to auscultation without wheezing, rhonchi or crackles noted.  Heart: S1+S2+0, regular and normal without murmurs, rubs or gallops noted.   Abdomen: Soft, non-tender and non-distended with normal bowel sounds appreciated on auscultation.  Extremities: There is trace pitting edema in the distal lower extremities bilaterally.   Skin: Warm and dry without trophic changes noted.   Musculoskeletal: exam reveals no obvious joint deformities, tenderness or joint swelling or erythema.   Neurologically:  Mental status: The patient is awake, alert and oriented in all 4 spheres. His immediate and remote memory, attention, language skills and fund of knowledge are appropriate.  He does have some word finding difficulty at times.  Mood is normal and affect is normal.  Cranial nerves II - XII are as described above under HEENT exam.  Motor exam: Normal bulk, strength and tone is noted. There is no tremor, Fine motor skills and coordination: grossly  intact.  Cerebellar testing: No dysmetria or intention tremor. There is no truncal or gait ataxia.  Sensory exam: intact to light touch in the upper and lower extremities.  Gait, station and balance: He stands without difficulty, stands naturally a little wider base, he walks without a limp.  Preserved arm swing noted.   Assessment and Plan:   In summary, RAMELO OETKEN is a very pleasant 74 y.o.-year old male with an underlying medical history of diabetes, neuropathy, reflux disease, kidney stones, lung cancer with right kidney removal last year, hypertension, memory loss, restless leg syndrome, allergies, history of TIA, osteoarthritis and obesity, who presents for evaluation of his sleep apnea.  He was diagnosed several years ago, tried CPAP but has not used his machine in years, he has an older machine, he had trouble tolerating the mask and the tubing came in the way and the machine also was noisy.  He would be willing to get reevaluated with a home sleep test.  He would prefer the home sleep test but would be open to coming in if his insurance covers a nocturnal polysomnogram.  We talked about the differences between home sleep test and the laboratory sleep study.  He would be willing to consider CPAP therapy again.  He was advised that there are different less intrusive interfaces available now that the machines are quieter generally speaking.  We talked about sleep apnea, its  prognosis and treatment options and alternative treatment options.  We will proceed with testing and take it from there.  I plan to see him back after his study.  I answered all the questions today and the patient and his wife are in agreement.  Thank you very much for allowing me to participate in the care of this nice patient. If I can be of any further assistance to you please do not hesitate to talk to me.    Sincerely,   Star Age, MD, PhD

## 2019-10-18 ENCOUNTER — Ambulatory Visit (INDEPENDENT_AMBULATORY_CARE_PROVIDER_SITE_OTHER): Payer: Medicare PPO | Admitting: Neurology

## 2019-10-18 DIAGNOSIS — G4719 Other hypersomnia: Secondary | ICD-10-CM

## 2019-10-18 DIAGNOSIS — G4733 Obstructive sleep apnea (adult) (pediatric): Secondary | ICD-10-CM | POA: Diagnosis not present

## 2019-10-18 DIAGNOSIS — R413 Other amnesia: Secondary | ICD-10-CM

## 2019-10-18 DIAGNOSIS — G4761 Periodic limb movement disorder: Secondary | ICD-10-CM

## 2019-10-18 DIAGNOSIS — G2581 Restless legs syndrome: Secondary | ICD-10-CM

## 2019-10-18 DIAGNOSIS — E669 Obesity, unspecified: Secondary | ICD-10-CM

## 2019-10-18 DIAGNOSIS — G4720 Circadian rhythm sleep disorder, unspecified type: Secondary | ICD-10-CM

## 2019-10-18 DIAGNOSIS — R351 Nocturia: Secondary | ICD-10-CM

## 2019-10-29 NOTE — Addendum Note (Signed)
Addended by: Star Age on: 10/29/2019 06:11 PM   Modules accepted: Orders

## 2019-10-29 NOTE — Progress Notes (Signed)
Patient referred by Butler Denmark, NP for re-eval of his OSA, seen by me on 10/01/19, split night sleep study on 10/18/19. Please call and notify patient that the recent sleep study confirmed the diagnosis of severe OSA. He did well with CPAP during the study with improvement of the respiratory events, but he did not achieve and REM sleep (before or after CPAP start). I would like start the patient on CPAP therapy at home by prescribing a machine for home use. I placed the order in the chart.  Please advise patient that we need a follow up appointment with either myself or one of our nurse practitioners in about 10 weeks post set-up to check for how the patient is feeling and how well the patient is using the machine, etc. Please go ahead and schedule the appointment, while you have the patient on the phone and make sure patient understands the importance of keeping this window for the FU appointment, as it is often an insurance requirement. Failing to adhere to this may result in losing coverage for sleep apnea treatment, at which point most patients are left with a choice of returning the machine or paying out of pocket (and we want neither of this to happen!).  Please re-enforce the importance of compliance with treatment and the need for [place] to monitor compliance data - again an insurance requirement and usually a good feedback for the patient as far as how they are doing.  Also remind patient, that any PAP machine or mask issues should be first addressed with the DME company, who provided the machine/mask.  Please ask if patient has a preference regarding DME company, may depend on the insurance too.  Please arrange for CPAP set up at home through a DME company of patient's choice.  Once you have spoken to the patient you can close the phone encounter. Please fax/route report to referring provider, thanks,   Star Age, MD, PhD [facility])

## 2019-10-29 NOTE — Procedures (Signed)
PATIENT'S NAME:  Jason [name], Jason [name] DOB:      [birth year]      MR#:    [id number]     DATE OF RECORDING: 10/18/2019 REFERRING M.D.:  Butler Denmark, NP Study Performed:  Split-Night Titration Study HISTORY: 74 year old man with a history of diabetes, neuropathy, reflux disease, kidney stones, lung cancer with right kidney removal last year, hypertension, memory loss, restless leg syndrome, allergies, history of TIA, osteoarthritis and obesity, who reports a prior diagnosis of obstructive sleep apnea.  He was originally diagnosed some 20 years ago.  He tried CPAP therapy in the past but could not tolerate it. The patient endorsed the Epworth Sleepiness Scale at 17/24 points. The patient's weight 239 pounds with a height of 70 (inches), resulting in a BMI of 34.1 kg/m2. The patient's neck circumference measured 18.8 inches.  CURRENT MEDICATIONS: Aspirin, Tegretol, Cymbalta, Zestril, Glucophage, Lopressor, Ditropan, Protonix, Lyrica, Phnergan, Requip, Crestor, Flomax.    PROCEDURE:  This is a multichannel digital polysomnogram utilizing the Somnostar 11.2 system.  Electrodes and sensors were applied and monitored per AASM Specifications.   EEG, EOG, Chin and Limb EMG, were sampled at 200 Hz.  ECG, Snore and Nasal Pressure, Thermal Airflow, Respiratory Effort, CPAP Flow and Pressure, Oximetry was sampled at 50 Hz. Digital video and audio were recorded.      BASELINE STUDY WITHOUT CPAP RESULTS:  Lights Out was at 22:02 and Lights On at 05:00.  Total recording time (TRT) was 200.5, with a total sleep time (TST) of 121 minutes.   The patient's sleep latency was 37.5 minutes.  REM sleep was absent. The sleep efficiency was 60.3 %.    SLEEP ARCHITECTURE: WASO (Wake after sleep onset) was 61 minutes with moderate sleep fragmentation noted. Stage N1 was 19 minutes, Stage N2 was 99 minutes, Stage N3 was 3 minutes and Stage R (REM sleep) was 0 minutes.  The percentages were Stage N1 15.7%, which is increased, Stage N2  81.8%, which is markedly increased, Stage N3 2.5% and Stage R (REM sleep) was absent. The arousals were noted as: 35 were spontaneous, 40 were associated with PLMs, 57 were associated with respiratory events.  RESPIRATORY ANALYSIS:  There were a total of 87 respiratory events:  28 obstructive apneas, 10 central apneas and 0 mixed apneas with a total of 38 apneas and an apnea index (AI) of 18.8. There were 49 hypopneas with a hypopnea index of 24.3. The patient also had 0 respiratory event related arousals (RERAs).  Snoring was noted.     The total APNEA/HYPOPNEA INDEX (AHI) was 43.1 /hour and the total RESPIRATORY DISTURBANCE INDEX was 43.1 /hour.  0 events occurred in REM sleep and 108 events in NREM. The REM AHI was 0, /hour versus a non-REM AHI of 43.1 /hour. The patient spent 76.5 minutes sleep time in the supine position 222 minutes in non-supine. The supine AHI was 56.7 /hour versus a non-supine AHI of 35.0 /hour.  OXYGEN SATURATION & C02:  The wake baseline 02 saturation was 96%, with the lowest being 81%. Time spent below 89% saturation equaled 15 minutes.  PERIODIC LIMB MOVEMENTS: The patient had a total of 159 Periodic Limb Movements.  The Periodic Limb Movement (PLM) index was 78.8 /hour and the PLM Arousal index was 19.8 /hour. Audio and video analysis did not show any abnormal or unusual movements, behaviors, phonations or vocalizations. The patient took 1 bathroom break. Mild to moderate and at times loud snoring was noted. The EKG was in keeping with  normal sinus rhythm (NSR)  TITRATION STUDY WITH CPAP RESULTS:   The patient used a large Vitera FFM. CPAP was initiated at 5 cmH20 with heated humidity per AASM split night standards and pressure was advanced to 11 cmH20 because of hypopneas, apneas and desaturations.  At a PAP pressure of 11 cmH20, there was a reduction of the AHI to 0/hour, with non-supine NREM sleep achieved and O2 nadir of 91%.   Total recording time (TRT) was 218  minutes, with a total sleep time (TST) of 177.5 minutes. The patient's sleep latency was 21 minutes. REM latency was 0 minutes.  The sleep efficiency was 81.4 %.    SLEEP ARCHITECTURE: Wake after sleep was 37 minutes, Stage N1 12.5 minutes, Stage N2 124.5 minutes, Stage N3 40.5 minutes and Stage R (REM sleep) 0 minutes. The percentages were: Stage N1 7.%, Stage N2 70.1%, Stage N3 22.8% and Stage R (REM sleep) was absent. The arousals were noted as: 38 were spontaneous, 17 were associated with PLMs, 19 were associated with respiratory events.  RESPIRATORY ANALYSIS:  There were a total of 40 respiratory events: 9 obstructive apneas, 10 central apneas and 0 mixed apneas with a total of 19 apneas and an apnea index (AI) of 6.4. There were 21 hypopneas with a hypopnea index of 7.1 /hour. The patient also had 0 respiratory event related arousals (RERAs).      The total APNEA/HYPOPNEA INDEX  (AHI) was 13.5 /hour and the total RESPIRATORY DISTURBANCE INDEX was 13.5 /hour.  0 events occurred in REM sleep and 40 events in NREM. The REM AHI was 0 /hour versus a non-REM AHI of 13.5 /hour. REM sleep was achieved on a pressure of  cm/h2o (AHI was  .) The patient spent 17% of total sleep time in the supine position. The supine AHI was 63.9 /hour, versus a non-supine AHI of 2.8/hour.  OXYGEN SATURATION & C02:  The wake baseline 02 saturation was 97%, with the lowest being 84%. Time spent below 89% saturation equaled 1 minutes.  PERIODIC LIMB MOVEMENTS:    The patient had a total of 97 Periodic Limb Movements. The Periodic Limb Movement (PLM) index was 32.8/hour and the PLM Arousal index was 5.7 /hour.  Post-study, the patient indicated that sleep was better than usual.   POLYSOMNOGRAPHY IMPRESSION :   1. Obstructive Sleep Apnea (OSA)  2. Dysfunctions associated with sleep stages or arousals from sleep 3. Periodic Limb Movement Disorder (PLMD)  RECOMMENDATIONS:  1. This patient has severe obstructive sleep  apnea (baseline total AHI of 43.1/hour, O2 nadir of 81%), and responded well on CPAP therapy, but did not achieve any supine or REM sleep on the final titration pressure of 11 cm. I will, therefore, recommend a home CPAP treatment pressure of 12 cm via FFM of choice. The patient should be reminded to be fully compliant with PAP therapy to improve sleep related symptoms and decrease long term cardiovascular risks. Please note that untreated obstructive sleep apnea may carry additional perioperative morbidity. Patients with significant obstructive sleep apnea should receive perioperative PAP therapy and the surgeons and particularly the anesthesiologist should be informed of the diagnosis and the severity of the sleep disordered breathing. 2. Moderate PLMs (periodic limb movements of sleep) were noted during this study with minimal arousals; clinical correlation is recommended.  3. This study shows sleep fragmentation and abnormal sleep stage percentages; these are nonspecific findings and per se do not signify an intrinsic sleep disorder or a cause for the patient's sleep-related  symptoms. Causes include (but are not limited to) the first night effect of the sleep study, circadian rhythm disturbances, medication effect or an underlying mood disorder or medical problem. The absence of REM sleep during this study may underestimate his sleep disordered breathing. 4. The patient should be cautioned not to drive, work at heights, or operate dangerous or heavy equipment when tired or sleepy. Review and reiteration of good sleep hygiene measures should be pursued with any patient. 5. The patient will be seen in follow-up in the sleep clinic at [facility] for discussion of the test results, symptom and treatment compliance review, further management strategies, etc. The referring provider will be notified of the test results.  I certify that I have reviewed the entire raw data recording prior to the issuance of this report in  accordance with the Standards of Accreditation of the American Academy of Sleep Medicine (AASM)  Star Age, MD, PhD Diplomat, American Board of Neurology and Sleep Medicine (Neurology and Sleep Medicine)

## 2019-11-02 ENCOUNTER — Telehealth: Payer: Self-pay

## 2019-11-02 NOTE — Telephone Encounter (Signed)
-----   Message from Star Age, MD sent at 10/29/2019  6:10 PM EDT ----- Patient referred by Butler Denmark, NP for re-eval of his OSA, seen by me on 10/01/19, split night sleep study on 10/18/19. Please call and notify patient that the recent sleep study confirmed the diagnosis of severe OSA. He did well with CPAP during the study with improvement of the respiratory events, but he did not achieve and REM sleep (before or after CPAP start). I would like start the patient on CPAP therapy at home by prescribing a machine for home use. I placed the order in the chart.  Please advise patient that we need a follow up appointment with either myself or one of our nurse practitioners in about 10 weeks post set-up to check for how the patient is feeling and how well the patient is using the machine, etc. Please go ahead and schedule the appointment, while you have the patient on the phone and make sure patient understands the importance of keeping this window for the FU appointment, as it is often an insurance requirement. Failing to adhere to this may result in losing coverage for sleep apnea treatment, at which point most patients are left with a choice of returning the machine or paying out of pocket (and we want neither of this to happen!).  Please re-enforce the importance of compliance with treatment and the need for [place] to monitor compliance data - again an insurance requirement and usually a good feedback for the patient as far as how they are doing.  Also remind patient, that any PAP machine or mask issues should be first addressed with the DME company, who provided the machine/mask.  Please ask if patient has a preference regarding DME company, may depend on the insurance too.  Please arrange for CPAP set up at home through a DME company of patient's choice.  Once you have spoken to the patient you can close the phone encounter. Please fax/route report to referring provider, thanks,   Star Age, MD,  PhD [facility])

## 2019-11-02 NOTE — Telephone Encounter (Signed)
I called pt to discuss his sleep study results. No answer, left a message asking him to call me back. 

## 2019-11-05 NOTE — Telephone Encounter (Signed)
Pt returned phone call. I advised pt that Dr. Rexene Alberts reviewed their sleep study results and found that pt had severe osa and did well during the study when put on cpap. Dr. Rexene Alberts recommends that pt start cpap treatment in the home. I reviewed PAP compliance expectations with the pt. Pt is agreeable to starting a CPAP. I advised pt that an order will be sent to a DME, [facility], and [facility] will call the pt within about one week after they file with the pt's insurance. [facility] will show the pt how to use the machine, fit for masks, and troubleshoot the CPAP if needed. A follow up appt was made for insurance purposes with Dr. 01/21/2020 at 100 pm. Pt verbalized understanding to arrive 15 minutes early and bring their CPAP. A letter with all of this information in it will be mailed to the pt as a reminder. I verified with the pt that the address we have on file is correct. Pt verbalized understanding of results. Pt had no questions at this time but was encouraged to call back if questions arise. I have sent the order to [facility] and have received confirmation that they have received the order.

## 2019-12-22 ENCOUNTER — Other Ambulatory Visit: Payer: Self-pay | Admitting: Neurology

## 2019-12-31 ENCOUNTER — Encounter: Payer: Self-pay | Admitting: Neurology

## 2020-01-19 ENCOUNTER — Encounter: Payer: Self-pay | Admitting: Neurology

## 2020-01-21 ENCOUNTER — Ambulatory Visit: Payer: Medicare PPO | Admitting: Neurology

## 2020-01-21 ENCOUNTER — Encounter: Payer: Self-pay | Admitting: Neurology

## 2020-01-21 VITALS — BP 122/71 | HR 78 | Ht 70.0 in | Wt 249.0 lb

## 2020-01-21 DIAGNOSIS — G4733 Obstructive sleep apnea (adult) (pediatric): Secondary | ICD-10-CM | POA: Diagnosis not present

## 2020-01-21 DIAGNOSIS — G2581 Restless legs syndrome: Secondary | ICD-10-CM

## 2020-01-21 DIAGNOSIS — Z9989 Dependence on other enabling machines and devices: Secondary | ICD-10-CM

## 2020-01-21 NOTE — Progress Notes (Signed)
Subjective:    Patient ID: Jason [name] is a 74 y.o. male.  HPI     Interim history:   Jason [name] is a 74 year old right-handed gentleman with an underlying medical history of diabetes, neuropathy, reflux disease, kidney stones, lung cancer with right kidney removal last year, hypertension, memory loss, restless leg syndrome, allergies, history of TIA, osteoarthritis and obesity, who Presents for follow-up consultation of his obstructive sleep apnea after interim sleep testing and starting CPAP therapy.  The patient is unaccompanied today.  I first met him at the request of Butler Denmark, NP, and Dr. Jannifer Franklin on 10/01/2019, at which time he reported a prior diagnosis of sleep apnea several years ago.  He had tried CPAP therapy but had intolerance at the time.  He was willing to get reevaluated and consider treatment again.  He was advised to proceed with sleep testing.  He had a split-night sleep study on 10/18/2019 which confirmed severe obstructive sleep apnea with a baseline AHI of 43.1/h, O2 nadir of 81%.  He responded well on CPAP therapy and was titrated to a final pressure of 11 cm but did not achieve any supine or REM sleep on the final pressure.  He was advised to start home treatment at a pressure of 12 cm via full facemask. His set up date was 11/26/19.   Today, 01/21/20: I reviewed His CPAP compliance data from 12/21/2019 through 01/19/2020, which is a total of 30 days, during which time he used his machine 20 days with percent use days greater than 4 hours and 17% only, indicating low compliance with an average usage of 2 hours and 57 minutes for days on treatment.  Residual AHI at goal at 2.8/h, pressure at 12 cm with EPR of 3, leak in the acceptable range with the 95th percentile at 10 L/min.  He reports having had some trouble adjusting to the treatment.  He also has intermittent restless leg symptoms.  In the first 30 days his compliance was just a little bit better.  His percent use days  greater than 4 hours in the month of September was 30%.  He is very motivated to continue with treatment.  He admits that sometimes he takes it off in the middle of the night and sometimes he just dozes off as he is so sleepy and forgets to put it on altogether.  His wife has noticed that he does seem to be a little quieter and less restless when he is on CPAP therapy.  He has noticed swelling in his legs.  He feels that the ropinirole for restless legs is helping.  He is wondering if his Lyrica has caused him some blurry vision.  He has had cataract surgeries.  He is supposed to see a new eye doctor as his previous eye doctor retired.   The patient's allergies, current medications, family history, past medical history, past social history, past surgical history and problem list were reviewed and updated as appropriate.    Previously:   10/01/19: (He) reports a prior diagnosis of obstructive sleep apnea.  He was originally diagnosed some 20 years ago.  He tried CPAP therapy in the past but could not tolerate it.  He particularly had difficulty with a full facemask in the tubing, also the machine was loud.  He still has the machine but has not used it in years, probably 8 or 10 years.  The machine is older.  His wife sleeps in a separate bedroom but has noted pauses  in his breathing and restless sleep.  He also has restless leg syndrome and takes ropinirole 2 mg twice daily.  He has been on high-dose gabapentin until recently and was switched to Lyrica for his neuropathy.  He has discomfort in the legs at night.  Symptoms of restless legs flares up at night.  He has no family history of sleep apnea but does report a family history of restless leg syndrome.  He has a variable sleep schedule, does get sleepy during the day and routinely takes a nap during the day.  He recently stopped working, he worked as a Research scientist (medical).  He works for [employer] for years, had been working from home for years.  Bedtime currently is  around 8 or 9, sometimes even earlier and rise time between 2 and 3 AM, sometimes he can sleep till 5 but often wakes up in the early morning hours and may go to the recliner and may doze off again.  He has nocturia about once or twice per average night.  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  He has does not currently have a CPAP machine.  His Epworth sleepiness score is 17 out of 24, fatigue severity score is 59 out of 63.  He drinks caffeine in the form of soda, about 2 servings per day, alcohol 2-3 times per week, quit smoking the pipe many years ago.  He has 2 grown children, 1 son who has 2 kids and 1 daughter who has 4 children. They do have a TV in the bedroom and it tends to stay on at night, sometimes he turns it off and sometimes it goes up on a timer.  I reviewed your office note from 09/14/2019.  His Past Medical History Is Significant For: Past Medical History:  Diagnosis Date  . Diabetes mellitus without complication (HCC)   . GERD (gastroesophageal reflux disease)   . History of anemia    Childhood  . History of cataract   . History of kidney stones   . History of migraine    resolved  . Hypertension    states under control with med., has been on med. x 5 yr.  . Insomnia   . Low blood pressure    x3 hospitalized once for it  . Memory difficulty 05/09/2018  . MRSA (methicillin resistant staph aureus) culture positive 09/03/2018  . Paresthesia 04/27/2015  . Peripheral neuropathy 11/06/2017  . RLS (restless legs syndrome) 04/27/2015  . Seasonal allergies   . Sleep apnea    no CPAP use, REPORTS HE NEEDS A NEW ONE   . Staph aureus infection 09/03/2018  . TIA (transient ischemic attack)   . Unilateral osteoarthritis of knee 12/2014   left    His Past Surgical History Is Significant For: Past Surgical History:  Procedure Laterality Date  .  CATARACT EXTRACTION, BILATERAL    . COLONOSCOPY    . CYSTOSCOPY WITH URETEROSCOPY AND STENT PLACEMENT Bilateral 09/08/2018   Procedure: CYSTOSCOPY WITH BILATERAL  URETEROSCOPY HOLMIUM LASER AND STENT PLACEMENT;  Surgeon: Crista Elliot, MD;  Location: [facility];  Service: Urology;  Laterality: Bilateral;  . LAPAROSCOPIC NEPHRECTOMY, HAND ASSISTED Right 10/15/2018   Procedure: HAND ASSISTED LAPAROSCOPIC NEPHRECTOMY;  Surgeon: Crista Elliot, MD;  Location: [facility];  Service: Urology;  Laterality: Right;  . PARTIAL KNEE ARTHROPLASTY Right 06/02/2013  Procedure: RIGHT UNICOMPARTMENTAL KNEE;  Surgeon: Eulas Post, MD;  Location: [facility];  Service: Orthopedics;  Laterality: Right;  . PARTIAL KNEE ARTHROPLASTY Left 12/17/2014   Procedure: LEFT KNEE UNI ARTHROPLASTY ;  Surgeon: Teryl Lucy, MD;  Location: [facility];  Service: Orthopedics;  Laterality: Left;  . TONSILLECTOMY  1950's  . UPPER GI ENDOSCOPY      His Family History Is Significant For: Family History  Problem Relation Age of Onset  . Emphysema Father        Died age 73  . COPD Father   . Sudden death Mother        Died age 71  . CAD Brother        Died age 77  . Hypertension Brother     His Social History Is Significant For: Social History   Socioeconomic History  . Marital status: Married    Spouse name: Aurea Graff  . Number of children: 2  . Years of education: 35  . Highest education level: Not on file  Occupational History  . Occupation: self-employed  Tobacco Use  . Smoking status: Former Smoker    Types: Pipe  . Smokeless tobacco: Never Used  . Tobacco comment: 06/02/2013   Vaping Use  . Vaping Use: Never used  Substance and Sexual Activity  . Alcohol use: Yes    Comment: occasionally  . Drug use: No  . Sexual activity: Yes  Other Topics Concern  . Not on file  Social History Narrative   Retiring from [employer].     Patient drinks 1 cup of caffeine daily.   Patient is right handed.    Social  Determinants of Health   Financial Resource Strain:   . Difficulty of Paying Living Expenses: Not on file  Food Insecurity:   . Worried About Programme researcher, broadcasting/film/video in the Last Year: Not on file  . Ran Out of Food in the Last Year: Not on file  Transportation Needs:   . Lack of Transportation (Medical): Not on file  . Lack of Transportation (Non-Medical): Not on file  Physical Activity:   . Days of Exercise per Week: Not on file  . Minutes of Exercise per Session: Not on file  Stress:   . Feeling of Stress : Not on file  Social Connections:   . Frequency of Communication with Friends and Family: Not on file  . Frequency of Social Gatherings with Friends and Family: Not on file  . Attends Religious Services: Not on file  . Active Member of Clubs or Organizations: Not on file  . Attends Banker Meetings: Not on file  . Marital Status: Not on file    His Allergies Are:  Allergies  Allergen Reactions  . Augmentin [Amoxicillin-Pot Clavulanate] Nausea Only    Did it involve swelling of the face/tongue/throat, SOB, or low BP? No Did it involve sudden or severe rash/hives, skin peeling, or any reaction on the inside of your mouth or nose? No Did you need to seek medical attention at a hospital or doctor's office? No When did it last happen?15-20 years ago If all above answers are "NO", may proceed with cephalosporin use.   Marland Kitchen Penicillins Nausea Only    Did it involve swelling of the face/tongue/throat, SOB, or low BP? No Did it involve sudden or severe rash/hives, skin peeling, or any reaction on the inside of your mouth or nose? No Did you need to seek medical attention at a hospital or  doctor's office? No When did it last happen?10-15 years ago If all above answers are "NO", may proceed with cephalosporin use.    :   His Current Medications Are:  Outpatient Encounter Medications as of 01/21/2020  Medication Sig  . aspirin EC 81 MG tablet Take 81 mg by mouth every  evening.  . carbamazepine (TEGRETOL) 200 MG tablet Take 1 tablet twice daily  . [employer] FIBER GUMMIES PO Take 2 tablets by mouth daily.  . DULoxetine (CYMBALTA) 60 MG capsule TAKE ONE CAPSULE BY MOUTH TWICE DAILY  . ELDERBERRY PO Take 2 tablets by mouth daily. Gummies  . lisinopril (ZESTRIL) 2.5 MG tablet   . metoprolol tartrate (LOPRESSOR) 25 MG tablet Take 25 mg by mouth every evening.   . Multiple Vitamins-Minerals (ADULT GUMMY PO) Take 2 tablets by mouth daily.  Marland Kitchen oxybutynin (DITROPAN) 5 MG tablet Take 1 tablet (5 mg total) by mouth 3 (three) times daily.  . pantoprazole (PROTONIX) 40 MG tablet Take 40 mg by mouth daily.  Jason [name] Glycol-Propyl Glycol (LUBRICANT EYE DROPS) 0.4-0.3 % SOLN Place 1 drop into both eyes 3 (three) times daily as needed (dry eyes).   . pregabalin (LYRICA) 200 MG capsule TAKE ONE CAPSULE BY MOUTH TWICE DAILY  . rOPINIRole (REQUIP) 2 MG tablet TAKE 1 TABLET BY MOUTH TWICE DAILY and TAKE TWO TABLETS AT BEDTIME  . rosuvastatin (CRESTOR) 10 MG tablet   . tamsulosin (FLOMAX) 0.4 MG CAPS capsule Take 0.4 mg by mouth daily.  . metFORMIN (GLUCOPHAGE) 500 MG tablet Take 500 mg by mouth 2 (two) times daily with a meal. (Patient not taking: Reported on 01/21/2020)  . promethazine (PHENERGAN) 25 MG tablet Take 12.5 mg by mouth every 6 (six) hours as needed for nausea. (Patient not taking: Reported on 01/21/2020)   No facility-administered encounter medications on file as of 01/21/2020.  :  Review of Systems:  Out of a complete 14 point review of systems, all are reviewed and negative with the exception of these symptoms as listed below: Review of Systems  Neurological:       Rm 1 initial CPAP FU, have trouble sleeping due to RLS, but getting better slowly. Epworth Sleepiness Scale 0= would never doze 1= slight chance of dozing 2= moderate chance of dozing 3= high chance of dozing  Sitting and reading:3 Watching TV:3 Sitting inactive in a public place (ex. Theater or  meeting):1 As a passenger in a car for an hour without a break:2 Lying down to rest in the afternoon:2 Sitting and talking to someone:0 Sitting quietly after lunch (no alcohol):1 In a car, while stopped in traffic: 0 Total:12    Objective:  Neurological Exam  Physical Exam Physical Examination:   Vitals:   01/21/20 1311  BP: 122/71  Pulse: 78    General Examination: The patient is a very pleasant 74 y.o. male in no acute distress. He appears well-developed and well-nourished and well groomed.   HEENT: Normocephalic, atraumatic, pupils are equal, round and reactive to light, extraocular tracking is good without limitation to gaze excursion or nystagmus noted.  Status post cataract surgeries bilaterally.  Hearing is grossly intact. Face is symmetric with normal facial animation. Speech is clear with no dysarthria noted. There is no hypophonia. There is no lip, neck/head, jaw or voice tremor. Neck is supple with full range of passive and active motion. There are no carotid bruits on auscultation. Oropharynx exam reveals: moderate mouth dryness, and moderate airway crowding.  Tongue protrudes centrally and palate  elevates symmetrically.    Chest: Clear to auscultation without wheezing, rhonchi or crackles noted.  Heart: S1+S2+0, regular and normal without murmurs, rubs or gallops noted.   Abdomen: Soft, non-tender and non-distended.  Extremities: There is trace pitting edema in the distal lower extremities bilaterally.   Skin: Warm and dry without trophic changes noted.   Musculoskeletal: exam reveals no obvious joint deformities, tenderness or joint swelling or erythema.   Neurologically:  Mental status: The patient is awake, alert and oriented in all 4 spheres. His immediate and remote memory, attention, language skills and fund of knowledge are appropriate.  He does have some word finding difficulty at times, stable. Mood is normal and affect is normal.  Cranial nerves II  - XII are as described above under HEENT exam.  Motor exam: Normal bulk, strength and tone is noted. There is no tremor, Fine motor skills and coordination: grossly intact.  Cerebellar testing: No dysmetria or intention tremor. There is no truncal or gait ataxia.  Sensory exam: intact to light touch in the upper and lower extremities.  Gait, station and balance: He stands without difficulty, stands naturally a little wider base, he walks without a limp.   Assessment and Plan:   In summary, Jason [name] is a very pleasant 74 year old male with an underlying medical history of diabetes, neuropathy, reflux disease, kidney stones, lung cancer with right kidney removal last year, hypertension, memory loss, restless leg syndrome, allergies, history of TIA, osteoarthritis and obesity, who presents for follow-up consultation of his obstructive sleep apnea after interim sleep testing and starting CPAP therapy.  He was originally diagnosed with sleep apnea several years ago but could not tolerate CPAP at the time.  He had a split-night sleep study on 10/18/2019 which confirmed severe obstructive sleep apnea.  He was started on CPAP therapy on 11/26/2019.  He has had trouble with compliance.  He is motivated to continue with treatment.  He has had nights where he pulled off the mask and some other nights his restless legs was bothersome and some nights he just fell asleep without putting it on.  He is encouraged to be fully compliant with treatment.  He is reminded that by insurance criteria we should try to get him compliant by the end of November for more than 4 hours, 70% of the time.  He is willing to pursue this.  He is also reminded that a consistent treatment of his sleep apnea may result in improvement of his restless leg symptoms.  His sleep study did show significant leg twitching, it was severe in the first part of the night without CPAP and much better in the later part of the night when he was on CPAP  therapy.  His wife also reports that he seems to sleep better when he is able to use his CPAP.  He is advised to call us or email [place] through [facility] in about a month so we can review his compliance data remotely.  He is advised to follow-up as scheduled with Butler Denmark, nurse practitioner in December and hopefully if he is doing well with CPAP therapy, he can be followed on a yearly basis for sleep apnea.  He is reminded to schedule an appointment with his new eye physician, he has had some blurry vision.  I answered all the questions today and the patient and his wife were in agreement.   I spent 30 minutes in total face-to-face time and in reviewing records during pre-charting, more  than 50% of which was spent in counseling and coordination of care, reviewing test results, reviewing medications and treatment regimen and/or in discussing or reviewing the diagnosis of OSA, the prognosis and treatment options. Pertinent laboratory and imaging test results that were available during this visit with the patient were reviewed by me and considered in my medical decision making (see chart for details).

## 2020-01-21 NOTE — Patient Instructions (Addendum)
It was good to see you both again today.  I appreciate you trying to be consistent with your CPAP therapy.  I think, over time, you will get adjusted to it and there is a chance that your restless leg symptoms may improve over time as are on treatment with CPAP for sleep apnea.  Please monitor your swelling in your legs.    Please call us in about a month or email through [facility] as a reminder so I can look at your compliance data in about a month from now.  We have approximately till end of November to show compliance as far as insurance criteria.  Please remember that your insurance may be strict about the compliance on CPAP therapy.  You can also talk to your DME provider about your compliance take range.   You can keep your appointment with Butler Denmark, nurse practitioner coming up in December.  If you are compliant with CPAP and doing better, you can see [place] on a yearly basis in sleep clinic.   Please continue using your CPAP regularly. While your insurance requires that you use CPAP at least 4 hours each night on 70% of the nights, I recommend, that you not skip any nights and use it throughout the night if you can. Getting used to CPAP and staying with the treatment long term does take time and patience and discipline. Untreated obstructive sleep apnea when it is moderate to severe can have an adverse impact on cardiovascular health and raise her risk for heart disease, arrhythmias, hypertension, congestive heart failure, stroke and diabetes. Untreated obstructive sleep apnea causes sleep disruption, nonrestorative sleep, and sleep deprivation. This can have an impact on your day to day functioning and cause daytime sleepiness and impairment of cognitive function, memory loss, mood disturbance, and problems focussing. Using CPAP regularly can improve these symptoms.

## 2020-02-26 DIAGNOSIS — G4733 Obstructive sleep apnea (adult) (pediatric): Secondary | ICD-10-CM | POA: Diagnosis not present

## 2020-03-08 ENCOUNTER — Other Ambulatory Visit: Payer: Self-pay | Admitting: Neurology

## 2020-03-15 ENCOUNTER — Encounter: Payer: Self-pay | Admitting: Neurology

## 2020-03-15 ENCOUNTER — Other Ambulatory Visit: Payer: Self-pay

## 2020-03-15 ENCOUNTER — Ambulatory Visit: Payer: Medicare PPO | Admitting: Neurology

## 2020-03-15 VITALS — BP 119/76 | HR 63 | Ht 70.0 in | Wt 250.0 lb

## 2020-03-15 DIAGNOSIS — R202 Paresthesia of skin: Secondary | ICD-10-CM

## 2020-03-15 DIAGNOSIS — G2581 Restless legs syndrome: Secondary | ICD-10-CM

## 2020-03-15 DIAGNOSIS — G8929 Other chronic pain: Secondary | ICD-10-CM | POA: Diagnosis not present

## 2020-03-15 DIAGNOSIS — M545 Low back pain, unspecified: Secondary | ICD-10-CM

## 2020-03-15 DIAGNOSIS — G603 Idiopathic progressive neuropathy: Secondary | ICD-10-CM

## 2020-03-15 MED ORDER — PREGABALIN 100 MG PO CAPS: 100.0000 mg | ORAL_CAPSULE | Freq: Three times a day (TID) | ORAL | 3 refills | Status: DC

## 2020-03-15 MED ORDER — NEUPRO 2 MG/24HR TD PT24: 1.0000 | MEDICATED_PATCH | Freq: Every day | TRANSDERMAL | 5 refills | Status: DC

## 2020-03-15 MED ORDER — DULOXETINE HCL 60 MG PO CPEP: 60.0000 mg | ORAL_CAPSULE | Freq: Two times a day (BID) | ORAL | 3 refills | Status: DC

## 2020-03-15 NOTE — Progress Notes (Signed)
PATIENT: Jason [name] DOB: 1946/02/23  REASON FOR VISIT: follow up HISTORY FROM: patient  HISTORY OF PRESENT ILLNESS: Today 03/15/20 Jason [name] is a 74 year old male with history of diabetes, small fiber neuropathy, mild memory disturbance, and RLS.  At last visit, he was switched from gabapentin to Lyrica.  He remains on carbamazepine, Cymbalta, and Requip.  A1c was elevated 6.6, carbamazepine was 6.1.  Was sent for sleep evaluation, confirmed severe OSA, started on CPAP.  He is having more pain from neuropathy, RLS symptoms, some days, starting earlier during the day RLS.  Lyrica 200 mg twice a day, has been helpful, but has noted side effect of swelling to the legs, and blurry vision.  Sharp pains are improved with carbamazepine, are rare.  Continues with issues with the balance, has had a few falls.  He is active, tinkers with hobbies. For RLS, moving around seems to be the only thing that helps, but pays for the increased activity in the evenings.  He is not using his CPAP, cannot get adjusted to it, with his uncontrolled neuropathy and RLS.  He knows he needs to use it, but isn't ready until his pain is better controlled.  Download of the last 30 days, shows usage 23/30 days, greater than 4 hours 4 days, less than 4 hours 19 days (63%).  AHI 2.2, leak in the 95th percentile 37.7.  Reporting low back pain, no radiation down the legs.  Has been chronic for several years.  The last several falls, have exacerbated the pain.  Had MRI of lumbar spine in 2018, possibility of L5 nerve root compression bilaterally, considering ESI, he never had this.  Presents today for follow-up accompanied by his wife.  Is taking carbamazepine, Cymbalta, Lyrica, and Requip from this office.  HISTORY 09/14/2019 SS: Jason [name] is a 74 year old male with history of diabetes, small fiber neuropathy, and mild memory disturbance.  He also has restless leg syndrome.  At times he may have lancinating pains, is on  carbamazepine and gabapentin. He is also taking Cymbalta and Requip.  At last visit, we increased gabapentin 800 mg 4 times a day.  Has not noticed any change.  The lancinating pains have improved with carbamazepine.  He has chronic burning in his feet, is worse at night, he does not sleep well.  Has history of OSA, has CPAP, but indicates his machine is at least 73 years old, the equipment does not work well, and he cannot use it.  He has had a few falls.  He uses a cane as needed.  He usually has to sleep in a recliner at night.  His diabetes remains borderline, but has not had his A1c checked in quite a while.  He also may have some numbness in his hands.  He presents today for evaluation accompanied by his wife.  REVIEW OF SYSTEMS: Out of a complete 14 system review of symptoms, the patient complains only of the following symptoms, and all other reviewed systems are negative.  Restless legs, paresthesia, low back pain  ALLERGIES: Allergies  Allergen Reactions  . Augmentin [Amoxicillin-Pot Clavulanate] Nausea Only    Did it involve swelling of the face/tongue/throat, SOB, or low BP? No Did it involve sudden or severe rash/hives, skin peeling, or any reaction on the inside of your mouth or nose? No Did you need to seek medical attention at a hospital or doctor's office? No When did it last happen?15-20 years ago If all above answers are "NO", may  proceed with cephalosporin use.   Marland Kitchen Penicillins Nausea Only    Did it involve swelling of the face/tongue/throat, SOB, or low BP? No Did it involve sudden or severe rash/hives, skin peeling, or any reaction on the inside of your mouth or nose? No Did you need to seek medical attention at a hospital or doctor's office? No When did it last happen?10-15 years ago If all above answers are "NO", may proceed with cephalosporin use.      HOME MEDICATIONS: Outpatient Medications Prior to Visit  Medication Sig Dispense Refill  . aspirin EC 81 MG tablet  Take 81 mg by mouth every evening.    . carbamazepine (TEGRETOL) 200 MG tablet TAKE 1 TABLET BY MOUTH TWICE DAILY 60 tablet 5  . [employer] FIBER GUMMIES PO Take 2 tablets by mouth daily.    . DULoxetine (CYMBALTA) 60 MG capsule TAKE ONE CAPSULE BY MOUTH TWICE DAILY 180 capsule 3  . ELDERBERRY PO Take 2 tablets by mouth daily. Gummies    . lisinopril (ZESTRIL) 2.5 MG tablet     . metFORMIN (GLUCOPHAGE) 500 MG tablet Take 500 mg by mouth 2 (two) times daily with a meal.    . metoprolol tartrate (LOPRESSOR) 25 MG tablet Take 25 mg by mouth every evening.     . Multiple Vitamins-Minerals (ADULT GUMMY PO) Take 2 tablets by mouth daily.    Marland Kitchen oxybutynin (DITROPAN) 5 MG tablet Take 1 tablet (5 mg total) by mouth 3 (three) times daily. 15 tablet 3  . pantoprazole (PROTONIX) 40 MG tablet Take 40 mg by mouth daily.    Vladimir Faster Glycol-Propyl Glycol 0.4-0.3 % SOLN Place 1 drop into both eyes 3 (three) times daily as needed (dry eyes).     . pregabalin (LYRICA) 200 MG capsule TAKE ONE CAPSULE BY MOUTH TWICE DAILY 60 capsule 5  . promethazine (PHENERGAN) 25 MG tablet Take 12.5 mg by mouth every 6 (six) hours as needed for nausea.    Marland Kitchen rOPINIRole (REQUIP) 2 MG tablet TAKE 1 TABLET BY MOUTH TWICE DAILY and TAKE TWO TABLETS AT BEDTIME 360 tablet 1  . rosuvastatin (CRESTOR) 10 MG tablet     . tamsulosin (FLOMAX) 0.4 MG CAPS capsule Take 0.4 mg by mouth daily.     No facility-administered medications prior to visit.    PAST MEDICAL HISTORY: Past Medical History:  Diagnosis Date  . Diabetes mellitus without complication ([place])   . GERD (gastroesophageal reflux disease)   . History of anemia    Childhood  . History of cataract   . History of kidney stones   . History of migraine    resolved  . Hypertension    states under control with med., has been on med. x 5 yr.  . Insomnia   . Low blood pressure    x3 hospitalized once for it  . Memory difficulty 05/09/2018  . MRSA (methicillin resistant staph aureus)  culture positive 09/03/2018  . Paresthesia 04/27/2015  . Peripheral neuropathy 11/06/2017  . RLS (restless legs syndrome) 04/27/2015  . Seasonal allergies   . Sleep apnea    no CPAP use, REPORTS HE NEEDS A NEW ONE   . Staph aureus infection 09/03/2018  . TIA (transient ischemic attack)   . Unilateral osteoarthritis of knee 12/2014   left    PAST SURGICAL HISTORY: Past Surgical History:  Procedure Laterality Date  . CATARACT EXTRACTION, BILATERAL    . COLONOSCOPY    . CYSTOSCOPY WITH URETEROSCOPY AND STENT PLACEMENT Bilateral 09/08/2018  Procedure: CYSTOSCOPY WITH BILATERAL  URETEROSCOPY HOLMIUM LASER AND STENT PLACEMENT;  Surgeon: Lucas Mallow, MD;  Location: [facility];  Service: Urology;  Laterality: Bilateral;  . LAPAROSCOPIC NEPHRECTOMY, HAND ASSISTED Right 10/15/2018   Procedure: HAND ASSISTED LAPAROSCOPIC NEPHRECTOMY;  Surgeon: Lucas Mallow, MD;  Location: [facility];  Service: Urology;  Laterality: Right;  . PARTIAL KNEE ARTHROPLASTY Right 06/02/2013   Procedure: RIGHT UNICOMPARTMENTAL KNEE;  Surgeon: Johnny Bridge, MD;  Location: [facility];  Service: Orthopedics;  Laterality: Right;  . PARTIAL KNEE ARTHROPLASTY Left 12/17/2014   Procedure: LEFT KNEE UNI ARTHROPLASTY ;  Surgeon: Marchia Bond, MD;  Location: [facility];  Service: Orthopedics;  Laterality: Left;  . TONSILLECTOMY  1950's  . UPPER GI ENDOSCOPY      FAMILY HISTORY: Family History  Problem Relation Age of Onset  . Emphysema Father        Died age 52  . COPD Father   . Sudden death Mother        Died age 28  . CAD Brother        Died age 50  . Hypertension Brother     SOCIAL HISTORY: Social History   Socioeconomic History  . Marital status: Married    Spouse name: Remo Lipps  . Number of children: 2  . Years of education: 45  . Highest education level: Not on file  Occupational History  . Occupation: self-employed  Tobacco Use  . Smoking status: Former Smoker    Types: Pipe  . Smokeless  tobacco: Never Used  . Tobacco comment: 06/02/2013   Vaping Use  . Vaping Use: Never used  Substance and Sexual Activity  . Alcohol use: Yes    Comment: occasionally  . Drug use: No  . Sexual activity: Yes  Other Topics Concern  . Not on file  Social History Narrative   Retiring from [employer].     Patient drinks 1 cup of caffeine daily.   Patient is right handed.    Social Determinants of Health   Financial Resource Strain: Not on file  Food Insecurity: Not on file  Transportation Needs: Not on file  Physical Activity: Not on file  Stress: Not on file  Social Connections: Not on file  Intimate Partner Violence: Not on file   PHYSICAL EXAM  Vitals:   03/15/20 1000  BP: 119/76  Pulse: 63  Weight: 250 lb (113.4 kg)  Height: 5\' 10"  (1.778 m)   Body mass index is 35.87 kg/m.  Generalized: Well developed, in no acute distress   Neurological examination  Mentation: Alert oriented to time, place, history taking. Follows all commands speech and language fluent Cranial nerve II-XII: Pupils were equal round reactive to light. Extraocular movements were full, visual field were full on confrontational test. Facial sensation and strength were normal. Head turning and shoulder shrug were normal and symmetric. Motor: Good strength all extremities, 1-2+ swelling noted to bilateral lower extremities Sensory: Stocking pattern sensory deficit to soft touch to bilateral lower extremities Coordination: Cerebellar testing reveals good finger-nose-finger and heel-to-shin bilaterally.  Gait and station: Gait is wide-based, cautious Reflexes: Deep tendon reflexes are symmetric and normal bilaterally.   DIAGNOSTIC DATA (LABS, IMAGING, TESTING) - I reviewed patient records, labs, notes, testing and imaging myself where available.  Lab Results  Component Value Date   WBC 7.5 09/14/2019   HGB 13.9 09/14/2019   HCT 40.3 09/14/2019   MCV 95 09/14/2019   PLT 231 09/14/2019  Component Value  Date/Time   NA 139 09/14/2019 1523   K 5.3 (H) 09/14/2019 1523   CL 101 09/14/2019 1523   CO2 22 09/14/2019 1523   GLUCOSE 91 09/14/2019 1523   GLUCOSE 111 (H) 10/16/2018 0426   BUN 19 09/14/2019 1523   CREATININE 1.06 09/14/2019 1523   CALCIUM 9.3 09/14/2019 1523   PROT 7.3 09/14/2019 1523   ALBUMIN 4.6 09/14/2019 1523   AST 23 09/14/2019 1523   ALT 19 09/14/2019 1523   ALKPHOS 128 (H) 09/14/2019 1523   BILITOT 0.3 09/14/2019 1523   GFRNONAA 70 09/14/2019 1523   GFRAA 81 09/14/2019 1523   No results found for: CHOL, HDL, LDLCALC, LDLDIRECT, TRIG, CHOLHDL Lab Results  Component Value Date   HGBA1C 6.6 (H) 09/14/2019   Lab Results  Component Value Date   [id number] 333 04/08/2017   No results found for: TSH  ASSESSMENT AND PLAN 74 y.o. year old male  has a past medical history of Diabetes mellitus without complication ([place]), GERD (gastroesophageal reflux disease), History of anemia, History of cataract, History of kidney stones, History of migraine, Hypertension, Insomnia, Low blood pressure, Memory difficulty (05/09/2018), MRSA (methicillin resistant staph aureus) culture positive (09/03/2018), Paresthesia (04/27/2015), Peripheral neuropathy (11/06/2017), RLS (restless legs syndrome) (04/27/2015), Seasonal allergies, Sleep apnea, Staph aureus infection (09/03/2018), TIA (transient ischemic attack), and Unilateral osteoarthritis of knee (12/2014). here with:  1.  Peripheral neuropathy, small fiber neuropathy 2.  Restless Leg Syndrome  3.  Chronic low back pain -Lyrica is controlling the pain better at 200 mg twice a day, but has noted adverse effect of swelling to lower extremities, decrease dose of 100 mg 3 times daily, call with update in 2 weeks to see if swelling has improved -Continue Cymbalta 60 mg twice a day -Continue carbamazepine 200 mg twice a day -Add in Neupro patch 2 mg daily, cut back Requip 2 mg tablets, can take 2 tablets daily for break through pain  -Will check MRI  of lumbar spine, more pain of recent, several falls -Consider referral to pain management, depending on MRI lumbar spine would this be a surgical issue? -He will call for dose adjustment of medications, may have to increase Neupro patch dosing -Change follow-up to 4 months   4.  OSA on CPAP  -Using CPAP, compliance for greater than 4 hours is poor (13%) -Will try to gain better control of pain, he is motivated to use CPAP, hopefully will improve his compliance   I spent 30 minutes of face-to-face and non-face-to-face time with patient.  This included previsit chart review, lab review, study review, order entry, electronic health record documentation, patient education.  Butler Denmark, AGNP-C, DNP 03/15/2020, 11:15 AM [facility] [address], [place], [place] [address] [phone]

## 2020-03-15 NOTE — Patient Instructions (Addendum)
Let me review your chart and medications Will call you later today with our plan  See you back in 6 months

## 2020-03-15 NOTE — Progress Notes (Signed)
I have read the note, and I agree with the clinical assessment and plan.  Jason [name] K Jason [name]   

## 2020-03-16 ENCOUNTER — Telehealth: Payer: Self-pay | Admitting: Neurology

## 2020-03-16 NOTE — Telephone Encounter (Signed)
Mcarthur Rossetti Josem Kaufmann: [id number] (exp. 03/16/20 to 04/15/20) order sent to GI. They will reach out to the patient to schedule.

## 2020-03-24 ENCOUNTER — Encounter: Payer: Self-pay | Admitting: Neurology

## 2020-03-24 ENCOUNTER — Telehealth: Payer: Self-pay | Admitting: *Deleted

## 2020-03-24 NOTE — Telephone Encounter (Signed)
Called [facility], spoke with Press photographer. They have correct Neupro patch Rx and Lyrica Rx on file. The patient picked up Lyrica refill before new Rx was processed. She will call him and advise he stop Lyrica 200 mg and pick up new Rx for 100 mg. She will cancel Lyrica 200 mg refills. Gave her verbal on Requip per NP's office note,: Requip 2 mg tablets, can take 2 tablets daily for break through pain. She verbalized understanding, appreciation. Replied to his my chart to advise.

## 2020-03-27 DIAGNOSIS — G4733 Obstructive sleep apnea (adult) (pediatric): Secondary | ICD-10-CM | POA: Diagnosis not present

## 2020-03-29 DIAGNOSIS — B974 Respiratory syncytial virus as the cause of diseases classified elsewhere: Secondary | ICD-10-CM | POA: Diagnosis not present

## 2020-03-29 DIAGNOSIS — J101 Influenza due to other identified influenza virus with other respiratory manifestations: Secondary | ICD-10-CM | POA: Diagnosis not present

## 2020-03-29 DIAGNOSIS — Z20828 Contact with and (suspected) exposure to other viral communicable diseases: Secondary | ICD-10-CM | POA: Diagnosis not present

## 2020-03-29 DIAGNOSIS — U071 COVID-19: Secondary | ICD-10-CM | POA: Diagnosis not present

## 2020-03-30 ENCOUNTER — Ambulatory Visit
Admission: RE | Admit: 2020-03-30 | Discharge: 2020-03-30 | Disposition: A | Discharge status: Home / Self Care | Payer: Medicare PPO | Source: Ambulatory Visit | Attending: Neurology | Admitting: Neurology

## 2020-03-30 DIAGNOSIS — G8929 Other chronic pain: Secondary | ICD-10-CM | POA: Diagnosis not present

## 2020-03-30 DIAGNOSIS — M545 Low back pain, unspecified: Secondary | ICD-10-CM | POA: Diagnosis not present

## 2020-04-04 ENCOUNTER — Telehealth: Payer: Self-pay | Admitting: Neurology

## 2020-04-04 DIAGNOSIS — G8929 Other chronic pain: Secondary | ICD-10-CM

## 2020-04-04 DIAGNOSIS — M545 Low back pain, unspecified: Secondary | ICD-10-CM

## 2020-04-04 NOTE — Telephone Encounter (Signed)
LMVM for pt to return call for MRI lumbar results by 1400 today or will be tomorrow.

## 2020-04-04 NOTE — Telephone Encounter (Signed)
Please call, MRI of the lumbar spine shows severe left foraminal narrowing, likely nerve root encroachment. If patient is agreeable, may consider referral to neurosurgery for consultation and pain management. I don't think he has ever had ESI, they could offer pain control.   IMPRESSION: Abnormal MRI scan lumbar spine without contrast showing prominent spondylitic changes throughout most prominent at L4-5 but there is severe left rater than right foraminal narrowing and likely encroachment on the nerve roots.  There is also asymmetric disc protrusion at L3-4 to the left resulting in mild left foraminal narrowing.  Overall these degenerative changes shows mild progression compared with previous MRI from 2018.

## 2020-04-05 MED ORDER — NEUPRO 4 MG/24HR TD PT24: 1.0000 | MEDICATED_PATCH | Freq: Every day | TRANSDERMAL | 5 refills | Status: DC

## 2020-04-05 NOTE — Addendum Note (Signed)
Addended by: Glean Salvo on: 04/05/2020 03:24 PM   Modules accepted: Orders

## 2020-04-05 NOTE — Telephone Encounter (Signed)
I called pt and relayed the MRI Lumbar results.  He verbalized understanding.  Would like to proceed with Neurosurgery consult.  He stated as well that patch he believes is healping some, still has to take 3 tablets daily of requip.  FYI.

## 2020-04-05 NOTE — Addendum Note (Signed)
Addended by: Guy Begin on: 04/05/2020 01:57 PM   Modules accepted: Orders

## 2020-04-05 NOTE — Telephone Encounter (Signed)
Let's increase the Neupro to 4 mg patch, try to cut back the Requip to 2 mg twice daily.

## 2020-04-06 NOTE — Telephone Encounter (Signed)
I called pt and LMVM for him that neupro patch increased to 4mg  daily, decrease to requip 2mg  po bid if can.  He is to call back if needed.

## 2020-04-11 DIAGNOSIS — R269 Unspecified abnormalities of gait and mobility: Secondary | ICD-10-CM | POA: Diagnosis not present

## 2020-04-11 DIAGNOSIS — M549 Dorsalgia, unspecified: Secondary | ICD-10-CM | POA: Diagnosis not present

## 2020-04-27 DIAGNOSIS — G4733 Obstructive sleep apnea (adult) (pediatric): Secondary | ICD-10-CM | POA: Diagnosis not present

## 2020-05-06 ENCOUNTER — Other Ambulatory Visit: Payer: Self-pay | Admitting: Neurosurgery

## 2020-05-06 DIAGNOSIS — R269 Unspecified abnormalities of gait and mobility: Secondary | ICD-10-CM

## 2020-05-17 DIAGNOSIS — E119 Type 2 diabetes mellitus without complications: Secondary | ICD-10-CM | POA: Diagnosis not present

## 2020-05-23 DIAGNOSIS — G4733 Obstructive sleep apnea (adult) (pediatric): Secondary | ICD-10-CM | POA: Diagnosis not present

## 2020-05-28 ENCOUNTER — Ambulatory Visit
Admission: RE | Admit: 2020-05-28 | Discharge: 2020-05-28 | Disposition: A | Discharge status: Home / Self Care | Payer: Medicare PPO | Source: Ambulatory Visit | Attending: Neurosurgery | Admitting: Neurosurgery

## 2020-05-28 ENCOUNTER — Other Ambulatory Visit: Payer: Self-pay

## 2020-05-28 DIAGNOSIS — R200 Anesthesia of skin: Secondary | ICD-10-CM | POA: Diagnosis not present

## 2020-05-28 DIAGNOSIS — R269 Unspecified abnormalities of gait and mobility: Secondary | ICD-10-CM

## 2020-05-28 DIAGNOSIS — R296 Repeated falls: Secondary | ICD-10-CM | POA: Diagnosis not present

## 2020-05-28 DIAGNOSIS — R531 Weakness: Secondary | ICD-10-CM | POA: Diagnosis not present

## 2020-05-28 DIAGNOSIS — G4733 Obstructive sleep apnea (adult) (pediatric): Secondary | ICD-10-CM | POA: Diagnosis not present

## 2020-05-28 DIAGNOSIS — M4802 Spinal stenosis, cervical region: Secondary | ICD-10-CM | POA: Diagnosis not present

## 2020-05-28 DIAGNOSIS — I6782 Cerebral ischemia: Secondary | ICD-10-CM | POA: Diagnosis not present

## 2020-06-25 DIAGNOSIS — G4733 Obstructive sleep apnea (adult) (pediatric): Secondary | ICD-10-CM | POA: Diagnosis not present

## 2020-07-05 ENCOUNTER — Other Ambulatory Visit: Payer: Self-pay | Admitting: Neurology

## 2020-07-18 DIAGNOSIS — C641 Malignant neoplasm of right kidney, except renal pelvis: Secondary | ICD-10-CM | POA: Diagnosis not present

## 2020-07-21 DIAGNOSIS — C641 Malignant neoplasm of right kidney, except renal pelvis: Secondary | ICD-10-CM | POA: Diagnosis not present

## 2020-07-26 DIAGNOSIS — G4733 Obstructive sleep apnea (adult) (pediatric): Secondary | ICD-10-CM | POA: Diagnosis not present

## 2020-08-09 ENCOUNTER — Other Ambulatory Visit: Payer: Self-pay | Admitting: Neurology

## 2020-08-09 DIAGNOSIS — N3941 Urge incontinence: Secondary | ICD-10-CM | POA: Diagnosis not present

## 2020-08-09 DIAGNOSIS — R3915 Urgency of urination: Secondary | ICD-10-CM | POA: Diagnosis not present

## 2020-08-09 DIAGNOSIS — R351 Nocturia: Secondary | ICD-10-CM | POA: Diagnosis not present

## 2020-08-09 DIAGNOSIS — R350 Frequency of micturition: Secondary | ICD-10-CM | POA: Diagnosis not present

## 2020-08-09 DIAGNOSIS — C641 Malignant neoplasm of right kidney, except renal pelvis: Secondary | ICD-10-CM | POA: Diagnosis not present

## 2020-08-25 DIAGNOSIS — G4733 Obstructive sleep apnea (adult) (pediatric): Secondary | ICD-10-CM | POA: Diagnosis not present

## 2020-09-02 DIAGNOSIS — Z299 Encounter for prophylactic measures, unspecified: Secondary | ICD-10-CM | POA: Diagnosis not present

## 2020-09-02 DIAGNOSIS — U071 COVID-19: Secondary | ICD-10-CM | POA: Diagnosis not present

## 2020-09-13 ENCOUNTER — Ambulatory Visit: Payer: Medicare PPO | Admitting: Neurology

## 2020-09-22 DIAGNOSIS — R351 Nocturia: Secondary | ICD-10-CM | POA: Diagnosis not present

## 2020-09-22 DIAGNOSIS — R350 Frequency of micturition: Secondary | ICD-10-CM | POA: Diagnosis not present

## 2020-09-22 DIAGNOSIS — R3915 Urgency of urination: Secondary | ICD-10-CM | POA: Diagnosis not present

## 2020-09-22 DIAGNOSIS — N401 Enlarged prostate with lower urinary tract symptoms: Secondary | ICD-10-CM | POA: Diagnosis not present

## 2020-09-25 DIAGNOSIS — G4733 Obstructive sleep apnea (adult) (pediatric): Secondary | ICD-10-CM | POA: Diagnosis not present

## 2020-10-10 ENCOUNTER — Other Ambulatory Visit: Payer: Self-pay | Admitting: Neurology

## 2020-10-25 DIAGNOSIS — G4733 Obstructive sleep apnea (adult) (pediatric): Secondary | ICD-10-CM | POA: Diagnosis not present

## 2020-10-31 ENCOUNTER — Telehealth: Payer: Self-pay | Admitting: Neurology

## 2020-10-31 MED ORDER — PREGABALIN 150 MG PO CAPS: 150.0000 mg | ORAL_CAPSULE | Freq: Three times a day (TID) | ORAL | 3 refills | Status: DC

## 2020-10-31 MED ORDER — TRAMADOL HCL 50 MG PO TABS: 50.0000 mg | ORAL_TABLET | Freq: Four times a day (QID) | ORAL | 1 refills | Status: DC | PRN

## 2020-10-31 NOTE — Addendum Note (Signed)
Addended by: Kathrynn Ducking on: 10/31/2020 05:40 PM   Modules accepted: Orders

## 2020-10-31 NOTE — Telephone Encounter (Signed)
I called and talk with the wife.  The patient's had a dramatic increase in pain.  He had a COVID infection in June 2022, he is not doing well.  He has not been able to function because of the pain.  We will increase his Lyrica to 150 mg 3 times daily, I will give him a small prescription for Ultram to help in the meantime.  He will follow-up in the office.

## 2020-10-31 NOTE — Telephone Encounter (Signed)
Pt's wife, Elvon Mcculloh (on [place]) called, he will not come out of his room, not eating. Trying to get him go out, but he will not. Would like a call from the nurse.

## 2020-10-31 NOTE — Telephone Encounter (Signed)
I called the patient's wife, left a message, I will call back later.

## 2020-11-08 ENCOUNTER — Other Ambulatory Visit: Payer: Self-pay | Admitting: Neurology

## 2020-11-17 ENCOUNTER — Ambulatory Visit: Payer: Medicare PPO | Admitting: Neurology

## 2020-11-17 ENCOUNTER — Telehealth: Payer: Self-pay

## 2020-11-17 ENCOUNTER — Encounter: Payer: Self-pay | Admitting: Neurology

## 2020-11-17 VITALS — BP 106/67 | HR 71 | Ht 70.0 in | Wt 248.5 lb

## 2020-11-17 DIAGNOSIS — M545 Low back pain, unspecified: Secondary | ICD-10-CM | POA: Diagnosis not present

## 2020-11-17 DIAGNOSIS — Z5181 Encounter for therapeutic drug level monitoring: Secondary | ICD-10-CM | POA: Diagnosis not present

## 2020-11-17 DIAGNOSIS — G8929 Other chronic pain: Secondary | ICD-10-CM | POA: Diagnosis not present

## 2020-11-17 DIAGNOSIS — G2581 Restless legs syndrome: Secondary | ICD-10-CM | POA: Diagnosis not present

## 2020-11-17 DIAGNOSIS — G603 Idiopathic progressive neuropathy: Secondary | ICD-10-CM | POA: Diagnosis not present

## 2020-11-17 MED ORDER — PREGABALIN 200 MG PO CAPS: 200.0000 mg | ORAL_CAPSULE | Freq: Three times a day (TID) | ORAL | 1 refills | Status: DC

## 2020-11-17 NOTE — Progress Notes (Signed)
Reason for visit: Small fiber neuropathy, chronic pain, low back pain  Jason [name] is an 75 y.o. 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  The patient has significant discomfort associated with his neuropathy.  He has had lancinating pains at times that has been fairly well controlled with carbamazepine.  He has more of a burning sensation and a tight squeezing sensation of the feet that is more bothersome.  He is on Cymbalta and Lyrica.  He has had a significant exacerbation of his pain over the last 3 weeks or so to the point where he has not been able to function well, he has had a slight increase in Lyrica dose recently, he is taking some Ultram if needed for pain that has allowed him to function a bit better.  He has CPAP for severe sleep apnea but he does not use the CPAP regularly.  The patient is on a Neupro patch for restless leg syndrome which has been helpful.  The patient does have gait instability, he has not had any recent falls, he uses a cane for ambulation.  He does have some low back pain and neck pain without radiation in the arms or legs, he was seen through Dr. Duffy Rhody from neurosurgery, he had MRI of the brain and cervical spine recently that did not show any significant abnormalities.  The patient returns to this office for evaluation of severe pain increase.  He has not had any recent blood work done.  His last hemoglobin A1c a year ago was 6.6.  Past Medical History:  Diagnosis Date   Diabetes mellitus without complication (HCC)    GERD (gastroesophageal reflux disease)    History of anemia    Childhood   History of cataract    History of kidney stones    History of migraine    resolved   Hypertension    states under control with med., has been on med. x 5 yr.   Insomnia    Low blood pressure    x3 hospitalized once for it   Memory  difficulty 05/09/2018   MRSA (methicillin resistant staph aureus) culture positive 09/03/2018   Paresthesia 04/27/2015   Peripheral neuropathy 11/06/2017   RLS (restless legs syndrome) 04/27/2015   Seasonal allergies    Sleep apnea    no CPAP use, REPORTS HE NEEDS A NEW ONE    Staph aureus infection 09/03/2018   TIA (transient ischemic attack)    Unilateral osteoarthritis of knee 12/2014   left    Past Surgical History:  Procedure Laterality Date   CATARACT EXTRACTION, BILATERAL     COLONOSCOPY     CYSTOSCOPY WITH URETEROSCOPY AND STENT PLACEMENT Bilateral 09/08/2018   Procedure: CYSTOSCOPY WITH BILATERAL  URETEROSCOPY HOLMIUM LASER AND STENT PLACEMENT;  Surgeon: Lucas Mallow, MD;  Location: [facility];  Service: Urology;  Laterality: Bilateral;   LAPAROSCOPIC NEPHRECTOMY, HAND ASSISTED Right 10/15/2018   Procedure: HAND ASSISTED LAPAROSCOPIC NEPHRECTOMY;  Surgeon: Lucas Mallow, MD;  Location: [facility];  Service: Urology;  Laterality: Right;   PARTIAL KNEE ARTHROPLASTY Right 06/02/2013   Procedure: RIGHT UNICOMPARTMENTAL KNEE;  Surgeon: Johnny Bridge, MD;  Location: [place];  Service: Orthopedics;  Laterality: Right;   PARTIAL KNEE ARTHROPLASTY Left 12/17/2014   Procedure: LEFT KNEE UNI ARTHROPLASTY ;  Surgeon: Marchia Bond, MD;  Location: [place];  Service: Orthopedics;  Laterality: Left;   TONSILLECTOMY  1950's   UPPER GI ENDOSCOPY      Family History  Problem Relation Age of Onset   Emphysema Father        Died age 75   COPD Father    Sudden death Mother        Died age 10   CAD Brother        Died age 65   Hypertension Brother     Social history:  reports that he has quit smoking. His smoking use included pipe. He has never used smokeless tobacco. He reports current alcohol use. He reports that he does not use drugs.    Allergies  Allergen Reactions   Augmentin [Amoxicillin-Pot Clavulanate] Nausea Only    Did it involve swelling of the  face/tongue/throat, SOB, or low BP? No Did it involve sudden or severe rash/hives, skin peeling, or any reaction on the inside of your mouth or nose? No Did you need to seek medical attention at a hospital or doctor's office? No When did it last happen?15-20 years ago If all above answers are "NO", may proceed with cephalosporin use.    Penicillins Nausea Only    Did it involve swelling of the face/tongue/throat, SOB, or low BP? No Did it involve sudden or severe rash/hives, skin peeling, or any reaction on the inside of your mouth or nose? No Did you need to seek medical attention at a hospital or doctor's office? No When did it last happen?10-15 years ago If all above answers are "NO", may proceed with cephalosporin use.      Medications:  Prior to Admission medications   Medication Sig Start Date End Date Taking? Authorizing Provider  aspirin EC 81 MG tablet Take 81 mg by mouth every evening.   Yes [provider]  carbamazepine (TEGRETOL) 200 MG tablet TAKE 1 TABLET BY MOUTH TWICE DAILY 11/08/20  Yes Kathrynn Ducking, MD  [employer] FIBER GUMMIES PO Take 2 tablets by mouth daily.   Yes [provider]  DULoxetine (CYMBALTA) 60 MG capsule Take 1 capsule (60 mg total) by mouth 2 (two) times daily. 03/15/20  Yes Suzzanne Cloud, NP  ELDERBERRY PO Take 2 tablets by mouth daily. Gummies   Yes [provider]  lisinopril (ZESTRIL) 2.5 MG tablet  08/19/19  Yes [provider]  metoprolol tartrate (LOPRESSOR) 25 MG tablet Take 25 mg by mouth every evening.    Yes [provider]  Multiple Vitamins-Minerals (ADULT GUMMY PO) Take 2 tablets by mouth daily.   Yes [provider]  NEUPRO 4 MG/24HR place ONE PATCH onto THE SKIN DAILY 10/10/20  Yes Suzzanne Cloud, NP  oxybutynin (DITROPAN) 5 MG tablet Take 1 tablet (5 mg total) by mouth 3 (three) times daily. 09/08/18  Yes Marton Redwood III, MD  pantoprazole (PROTONIX) 40 MG tablet Take 40 mg by mouth  daily.   Yes [provider]  Polyethyl Glycol-Propyl Glycol 0.4-0.3 % SOLN Place 1 drop into both eyes 3 (three) times daily as needed (dry eyes).    Yes [provider]  pregabalin (LYRICA) 150 MG capsule Take 1 capsule (150 mg total) by mouth 3 (three) times daily. 10/31/20  Yes Kathrynn Ducking, MD  promethazine (PHENERGAN) 25 MG tablet Take 12.5 mg by mouth every 6 (six) hours as needed for nausea. 08/11/18  Yes [provider]  rOPINIRole (REQUIP) 2 MG tablet TAKE TWO TABLETS BY MOUTH EVERY DAY AS  NEEDED FOR BREAKTHROUGH PAIN 08/09/20  Yes Suzzanne Cloud, NP  rosuvastatin (CRESTOR) 10 MG tablet  08/19/19  Yes [provider]  tamsulosin (FLOMAX) 0.4 MG CAPS capsule Take 0.4 mg by mouth daily.   Yes [provider]  traMADol (ULTRAM) 50 MG tablet Take 1 tablet (50 mg total) by mouth every 6 (six) hours as needed. 10/31/20  Yes Kathrynn Ducking, MD    ROS:  Out of a complete 14 system review of symptoms, the patient complains only of the following symptoms, and all other reviewed systems are negative.  Foot and leg pain Low back pain, neck pain Walking difficulty  Blood pressure 106/67, pulse 71, height '5\' 10"'$  (1.778 m), weight 248 lb 8 oz (112.7 kg).  Physical Exam  General: The patient is alert and cooperative at the time of the examination.  The patient is markedly obese.  Skin: 1+ edema at the ankles are seen bilaterally.   Neurologic Exam  Mental status: The patient is alert and oriented x 3 at the time of the examination. The patient has apparent normal recent and remote memory, with an apparently normal attention span and concentration ability.   Cranial nerves: Facial symmetry is present. Speech is normal, no aphasia or dysarthria is noted. Extraocular movements are full. Visual fields are full.  Motor: The patient has good strength in all 4 extremities.  Sensory examination: Soft touch sensation is symmetric on the face, arms,  and legs.  Coordination: The patient has good finger-nose-finger and heel-to-shin bilaterally.  Gait and station: The patient has a normal gait. Tandem gait is slightly unsteady. Romberg is negative. No drift is seen.  The patient is able to walk on heels and the toes bilaterally.  Reflexes: Deep tendon reflexes are symmetric, reflexes are normal throughout with good ankle jerk reflexes bilaterally.   Assessment/Plan:  1.  Diabetes  2.  Small fiber neuropathy, chronic pain  3.  Chronic low back pain  4.  Restless leg syndrome  The patient has continued to have discomfort in the feet that has worsened significantly recently.  The patient will undergo blood work today.  He will increase his Lyrica dose by 50 mg each week until he gets to 200 mg 3 times daily.  He will use the Ultram if needed for breakthrough pain.  He will follow-up here in 4 months, in the future he can be seen through Dr. Krista Blue.  He will continue the Neupro patch for restless leg syndrome.  Jill Alexanders MD 11/17/2020 12:20 PM  [facility] [address] [place], [place] [address]  Phone [phone] Fax [phone]

## 2020-11-17 NOTE — Telephone Encounter (Signed)
Received call from Jason [name]) needing to confirm dosage of pt's Lyrica RX.  Per office visit note instructions from 11/17/2020, The patient will undergo blood work today.  He will increase his Lyrica dose by 50 mg each week until he gets to 200 mg 3 times daily.( Per Dr. Jannifer Franklin).  Sarah verbalized understanding and will make note of these instructions on the pt's chart.

## 2020-11-17 NOTE — Patient Instructions (Signed)
We will gradually increase the lyrica to 200 mg three times a day.

## 2020-11-18 LAB — COMPREHENSIVE METABOLIC PANEL
ALT: 31 IU/L (ref 0–44)
AST: 45 IU/L — ABNORMAL HIGH (ref 0–40)
Albumin/Globulin Ratio: 1.6 (ref 1.2–2.2)
Albumin: 4.1 g/dL (ref 3.7–4.7)
Alkaline Phosphatase: 113 IU/L (ref 44–121)
BUN/Creatinine Ratio: 18 (ref 10–24)
BUN: 19 mg/dL (ref 8–27)
Bilirubin Total: 0.4 mg/dL (ref 0.0–1.2)
CO2: 25 mmol/L (ref 20–29)
Calcium: 8.9 mg/dL (ref 8.6–10.2)
Chloride: 102 mmol/L (ref 96–106)
Creatinine, Ser: 1.06 mg/dL (ref 0.76–1.27)
Globulin, Total: 2.6 g/dL (ref 1.5–4.5)
Glucose: 123 mg/dL — ABNORMAL HIGH (ref 65–99)
Potassium: 4.7 mmol/L (ref 3.5–5.2)
Sodium: 140 mmol/L (ref 134–144)
Total Protein: 6.7 g/dL (ref 6.0–8.5)
eGFR: 74 mL/min/{1.73_m2} (ref >59)

## 2020-11-18 LAB — CARBAMAZEPINE LEVEL, TOTAL: Carbamazepine (Tegretol), S: 6.6 ug/mL (ref 4.0–12.0)

## 2020-11-18 LAB — CBC WITH DIFFERENTIAL/PLATELET
Basophils Absolute: 0 10*3/uL (ref 0.0–0.2)
Basos: 1 %
EOS (ABSOLUTE): 0.5 10*3/uL — ABNORMAL HIGH (ref 0.0–0.4)
Eos: 7 %
Hematocrit: 35.4 % — ABNORMAL LOW (ref 37.5–51.0)
Hemoglobin: 12.2 g/dL — ABNORMAL LOW (ref 13.0–17.7)
Immature Grans (Abs): 0 10*3/uL (ref 0.0–0.1)
Immature Granulocytes: 0 %
Lymphocytes Absolute: 1.7 10*3/uL (ref 0.7–3.1)
Lymphs: 25 %
MCH: 33.2 pg — ABNORMAL HIGH (ref 26.6–33.0)
MCHC: 34.5 g/dL (ref 31.5–35.7)
MCV: 96 fL (ref 79–97)
Monocytes Absolute: 0.5 10*3/uL (ref 0.1–0.9)
Monocytes: 8 %
Neutrophils Absolute: 4 10*3/uL (ref 1.4–7.0)
Neutrophils: 59 %
Platelets: 198 10*3/uL (ref 150–450)
RBC: 3.68 x10E6/uL — ABNORMAL LOW (ref 4.14–5.80)
RDW: 13.9 % (ref 11.6–15.4)
WBC: 6.7 10*3/uL (ref 3.4–10.8)

## 2020-11-24 ENCOUNTER — Ambulatory Visit: Payer: Medicare PPO | Admitting: Neurology

## 2020-11-25 DIAGNOSIS — G4733 Obstructive sleep apnea (adult) (pediatric): Secondary | ICD-10-CM | POA: Diagnosis not present

## 2020-11-29 DIAGNOSIS — Z87891 Personal history of nicotine dependence: Secondary | ICD-10-CM | POA: Diagnosis not present

## 2020-11-29 DIAGNOSIS — T50905A Adverse effect of unspecified drugs, medicaments and biological substances, initial encounter: Secondary | ICD-10-CM | POA: Diagnosis not present

## 2020-11-29 DIAGNOSIS — T421X1A Poisoning by iminostilbenes, accidental (unintentional), initial encounter: Secondary | ICD-10-CM | POA: Diagnosis not present

## 2020-11-29 DIAGNOSIS — R410 Disorientation, unspecified: Secondary | ICD-10-CM | POA: Diagnosis not present

## 2020-11-29 DIAGNOSIS — Z7982 Long term (current) use of aspirin: Secondary | ICD-10-CM | POA: Diagnosis not present

## 2020-11-29 DIAGNOSIS — R404 Transient alteration of awareness: Secondary | ICD-10-CM | POA: Diagnosis not present

## 2020-11-29 DIAGNOSIS — I440 Atrioventricular block, first degree: Secondary | ICD-10-CM | POA: Diagnosis not present

## 2020-11-29 DIAGNOSIS — R4182 Altered mental status, unspecified: Secondary | ICD-10-CM | POA: Diagnosis not present

## 2020-11-29 DIAGNOSIS — R1110 Vomiting, unspecified: Secondary | ICD-10-CM | POA: Diagnosis not present

## 2020-11-29 DIAGNOSIS — R5383 Other fatigue: Secondary | ICD-10-CM | POA: Diagnosis not present

## 2020-11-29 DIAGNOSIS — J9811 Atelectasis: Secondary | ICD-10-CM | POA: Diagnosis not present

## 2020-11-29 DIAGNOSIS — N390 Urinary tract infection, site not specified: Secondary | ICD-10-CM | POA: Diagnosis not present

## 2020-11-29 DIAGNOSIS — G629 Polyneuropathy, unspecified: Secondary | ICD-10-CM | POA: Diagnosis not present

## 2020-11-29 DIAGNOSIS — R400 Somnolence: Secondary | ICD-10-CM | POA: Diagnosis not present

## 2020-11-29 DIAGNOSIS — E872 Acidosis: Secondary | ICD-10-CM | POA: Diagnosis not present

## 2020-11-29 DIAGNOSIS — R059 Cough, unspecified: Secondary | ICD-10-CM | POA: Diagnosis not present

## 2020-11-29 DIAGNOSIS — R4020 Unspecified coma: Secondary | ICD-10-CM | POA: Diagnosis not present

## 2020-11-29 DIAGNOSIS — Z20822 Contact with and (suspected) exposure to covid-19: Secondary | ICD-10-CM | POA: Diagnosis not present

## 2020-11-30 DIAGNOSIS — T50905A Adverse effect of unspecified drugs, medicaments and biological substances, initial encounter: Secondary | ICD-10-CM | POA: Diagnosis not present

## 2020-11-30 DIAGNOSIS — R1110 Vomiting, unspecified: Secondary | ICD-10-CM | POA: Diagnosis not present

## 2020-11-30 DIAGNOSIS — N390 Urinary tract infection, site not specified: Secondary | ICD-10-CM | POA: Diagnosis not present

## 2020-11-30 DIAGNOSIS — R4182 Altered mental status, unspecified: Secondary | ICD-10-CM | POA: Diagnosis not present

## 2020-12-01 DIAGNOSIS — T50905A Adverse effect of unspecified drugs, medicaments and biological substances, initial encounter: Secondary | ICD-10-CM | POA: Diagnosis not present

## 2020-12-01 DIAGNOSIS — R1110 Vomiting, unspecified: Secondary | ICD-10-CM | POA: Diagnosis not present

## 2020-12-01 DIAGNOSIS — R4182 Altered mental status, unspecified: Secondary | ICD-10-CM | POA: Diagnosis not present

## 2020-12-01 DIAGNOSIS — N390 Urinary tract infection, site not specified: Secondary | ICD-10-CM | POA: Diagnosis not present

## 2020-12-03 ENCOUNTER — Other Ambulatory Visit: Payer: Self-pay | Admitting: Neurology

## 2020-12-07 ENCOUNTER — Other Ambulatory Visit: Payer: Self-pay | Admitting: Neurology

## 2020-12-07 DIAGNOSIS — Z299 Encounter for prophylactic measures, unspecified: Secondary | ICD-10-CM | POA: Diagnosis not present

## 2020-12-07 DIAGNOSIS — I10 Essential (primary) hypertension: Secondary | ICD-10-CM | POA: Diagnosis not present

## 2020-12-07 DIAGNOSIS — Z09 Encounter for follow-up examination after completed treatment for conditions other than malignant neoplasm: Secondary | ICD-10-CM | POA: Diagnosis not present

## 2020-12-07 DIAGNOSIS — T421X4A Poisoning by iminostilbenes, undetermined, initial encounter: Secondary | ICD-10-CM | POA: Diagnosis not present

## 2020-12-07 DIAGNOSIS — E1165 Type 2 diabetes mellitus with hyperglycemia: Secondary | ICD-10-CM | POA: Diagnosis not present

## 2020-12-08 ENCOUNTER — Other Ambulatory Visit: Payer: Self-pay | Admitting: Neurology

## 2020-12-08 DIAGNOSIS — Z5181 Encounter for therapeutic drug level monitoring: Secondary | ICD-10-CM

## 2020-12-27 DIAGNOSIS — Z23 Encounter for immunization: Secondary | ICD-10-CM | POA: Diagnosis not present

## 2021-01-16 DIAGNOSIS — Z6836 Body mass index (BMI) 36.0-36.9, adult: Secondary | ICD-10-CM | POA: Diagnosis not present

## 2021-01-16 DIAGNOSIS — E1165 Type 2 diabetes mellitus with hyperglycemia: Secondary | ICD-10-CM | POA: Diagnosis not present

## 2021-01-16 DIAGNOSIS — Z7189 Other specified counseling: Secondary | ICD-10-CM | POA: Diagnosis not present

## 2021-01-16 DIAGNOSIS — Z79899 Other long term (current) drug therapy: Secondary | ICD-10-CM | POA: Diagnosis not present

## 2021-01-16 DIAGNOSIS — Z125 Encounter for screening for malignant neoplasm of prostate: Secondary | ICD-10-CM | POA: Diagnosis not present

## 2021-01-16 DIAGNOSIS — I10 Essential (primary) hypertension: Secondary | ICD-10-CM | POA: Diagnosis not present

## 2021-01-16 DIAGNOSIS — E7800 Pure hypercholesterolemia, unspecified: Secondary | ICD-10-CM | POA: Diagnosis not present

## 2021-01-16 DIAGNOSIS — R5383 Other fatigue: Secondary | ICD-10-CM | POA: Diagnosis not present

## 2021-01-16 DIAGNOSIS — Z0000 Encounter for general adult medical examination without abnormal findings: Secondary | ICD-10-CM | POA: Diagnosis not present

## 2021-01-16 DIAGNOSIS — Z1331 Encounter for screening for depression: Secondary | ICD-10-CM | POA: Diagnosis not present

## 2021-02-15 DIAGNOSIS — C641 Malignant neoplasm of right kidney, except renal pelvis: Secondary | ICD-10-CM | POA: Diagnosis not present

## 2021-03-01 DIAGNOSIS — Z905 Acquired absence of kidney: Secondary | ICD-10-CM | POA: Diagnosis not present

## 2021-03-01 DIAGNOSIS — I2510 Atherosclerotic heart disease of native coronary artery without angina pectoris: Secondary | ICD-10-CM | POA: Diagnosis not present

## 2021-03-01 DIAGNOSIS — C641 Malignant neoplasm of right kidney, except renal pelvis: Secondary | ICD-10-CM | POA: Diagnosis not present

## 2021-03-01 DIAGNOSIS — I700 Atherosclerosis of aorta: Secondary | ICD-10-CM | POA: Diagnosis not present

## 2021-03-07 DIAGNOSIS — C641 Malignant neoplasm of right kidney, except renal pelvis: Secondary | ICD-10-CM | POA: Diagnosis not present

## 2021-03-07 DIAGNOSIS — R350 Frequency of micturition: Secondary | ICD-10-CM | POA: Diagnosis not present

## 2021-03-07 DIAGNOSIS — N401 Enlarged prostate with lower urinary tract symptoms: Secondary | ICD-10-CM | POA: Diagnosis not present

## 2021-03-07 DIAGNOSIS — N3941 Urge incontinence: Secondary | ICD-10-CM | POA: Diagnosis not present

## 2021-03-20 ENCOUNTER — Other Ambulatory Visit: Payer: Self-pay | Admitting: Neurology

## 2021-03-20 NOTE — Telephone Encounter (Signed)
Rx refilled.

## 2021-03-23 ENCOUNTER — Encounter: Payer: Self-pay | Admitting: Adult Health

## 2021-03-23 ENCOUNTER — Ambulatory Visit: Payer: Medicare PPO | Admitting: Adult Health

## 2021-03-23 ENCOUNTER — Other Ambulatory Visit: Payer: Self-pay

## 2021-03-23 VITALS — BP 128/82 | HR 89 | Ht 70.0 in | Wt 252.4 lb

## 2021-03-23 DIAGNOSIS — G603 Idiopathic progressive neuropathy: Secondary | ICD-10-CM | POA: Diagnosis not present

## 2021-03-23 DIAGNOSIS — G2581 Restless legs syndrome: Secondary | ICD-10-CM | POA: Diagnosis not present

## 2021-03-23 NOTE — Patient Instructions (Signed)
Your Plan:  Continue Neupro patch Increase Lyrica back to 200 mg three times a day     Thank you for coming to see [place] at [facility]. I hope we have been able to provide you high quality care today.  You may receive a patient satisfaction survey over the next few weeks. We would appreciate your feedback and comments so that we may continue to improve ourselves and the health of our patients.

## 2021-03-23 NOTE — Progress Notes (Signed)
PATIENT: Jason [name] DOB: 1946-01-21  REASON FOR VISIT: follow up HISTORY FROM: patient PRIMARY NEUROLOGIST: Dr. Krista Blue  HISTORY OF PRESENT ILLNESS: Today 03/23/21:  Jason [name] is a 75 year old male with a history of small fiber neuropathy and restless leg syndrome.  He returns today for follow-up.  The patient states that he was hospitalized several months ago for altered mental status.  Dr. Jannifer Franklin felt that it was medication overdose.  However the patient is now stating that his son counted his meds after the fact and he did not take any additional medication.  The patient has not restarted carbamazepine.  He is only been taking Lyrica 200 mg twice a day.  He states that since he has not restarted carbamazepine and taking a lower dose of Lyrica he has sharp shooting pains in the legs.  It is not constant but does occur very frequently.  He is hoping to restart carbamazepine or increase Lyrica.  He continues on Neupro patch for restless legs.  He has Requip but takes it infrequently.  Only for breakthrough symptoms.  He returns today for an evaluation.  HISTORY (copied from Dr. 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  The patient has significant discomfort associated with his neuropathy.  He has had lancinating pains at times that has been fairly well controlled with carbamazepine.  He has more of a burning sensation and a tight squeezing sensation of the feet that is more bothersome.  He is on Cymbalta and Lyrica.  He has had a significant exacerbation of his pain over the last 3 weeks or so to the point where he has not been able to function well, he has had a slight increase in Lyrica dose recently, he is taking some Ultram if needed for pain that has allowed him to function a bit better.  He has CPAP for severe sleep apnea but he does not use the CPAP regularly.  The patient is on a Neupro patch for  restless leg syndrome which has been helpful.  The patient does have gait instability, he has not had any recent falls, he uses a cane for ambulation.  He does have some low back pain and neck pain without radiation in the arms or legs, he was seen through Dr. Duffy Rhody from neurosurgery, he had MRI of the brain and cervical spine recently that did not show any significant abnormalities.  The patient returns to this office for evaluation of severe pain increase.  He has not had any recent blood work done.  His last hemoglobin A1c a year ago was 6.6  REVIEW OF SYSTEMS: Out of a complete 14 system review of symptoms, the patient complains only of the following symptoms, and all other reviewed systems are negative.  ALLERGIES: Allergies  Allergen Reactions   Augmentin [Amoxicillin-Pot Clavulanate] Nausea Only    Did it involve swelling of the face/tongue/throat, SOB, or low BP? No Did it involve sudden or severe rash/hives, skin peeling, or any reaction on the inside of your mouth or nose? No Did you need to seek medical attention at a hospital or doctor's office? No When did it last happen?15-20 years ago If all above answers are "NO", may proceed with cephalosporin use.    Penicillins Nausea Only    Did it involve swelling of the face/tongue/throat, SOB, or low BP? No Did it involve sudden or severe rash/hives, skin peeling, or any  reaction on the inside of your mouth or nose? No Did you need to seek medical attention at a hospital or doctor's office? No When did it last happen?10-15 years ago If all above answers are "NO", may proceed with cephalosporin use.      HOME MEDICATIONS: Outpatient Medications Prior to Visit  Medication Sig Dispense Refill   aspirin EC 81 MG tablet Take 81 mg by mouth every evening.     [employer] FIBER GUMMIES PO Take 2 tablets by mouth daily.     DULoxetine (CYMBALTA) 60 MG capsule TAKE ONE CAPSULE BY MOUTH TWICE DAILY 180 capsule 3   ELDERBERRY PO Take 2  tablets by mouth daily. Gummies     lisinopril (ZESTRIL) 2.5 MG tablet      metoprolol tartrate (LOPRESSOR) 25 MG tablet Take 25 mg by mouth every evening.      Multiple Vitamins-Minerals (ADULT GUMMY PO) Take 2 tablets by mouth daily.     NEUPRO 4 MG/24HR place ONE PATCH onto THE SKIN DAILY 30 patch 5   oxybutynin (DITROPAN) 5 MG tablet Take 1 tablet (5 mg total) by mouth 3 (three) times daily. 15 tablet 3   pantoprazole (PROTONIX) 40 MG tablet Take 40 mg by mouth daily.     Polyethyl Glycol-Propyl Glycol 0.4-0.3 % SOLN Place 1 drop into both eyes 3 (three) times daily as needed (dry eyes).      pregabalin (LYRICA) 200 MG capsule Take 1 capsule (200 mg total) by mouth 3 (three) times daily. 270 capsule 1   promethazine (PHENERGAN) 25 MG tablet Take 12.5 mg by mouth every 6 (six) hours as needed for nausea.     rOPINIRole (REQUIP) 2 MG tablet TAKE TWO TABLETS BY MOUTH EVERY DAY AS NEEDED FOR BREAKTHROUGH PAIN 60 tablet 5   rosuvastatin (CRESTOR) 10 MG tablet Take 10 mg by mouth daily.     tamsulosin (FLOMAX) 0.4 MG CAPS capsule Take 0.4 mg by mouth daily.     traMADol (ULTRAM) 50 MG tablet TAKE 1 TABLET BY MOUTH EVERY 6 HOURS AS NEEDED 60 tablet 1   carbamazepine (TEGRETOL) 200 MG tablet TAKE 1 TABLET BY MOUTH TWICE DAILY (Patient not taking: Reported on 03/23/2021) 60 tablet 5   No facility-administered medications prior to visit.    PAST MEDICAL HISTORY: Past Medical History:  Diagnosis Date   Diabetes mellitus without complication (HCC)    GERD (gastroesophageal reflux disease)    History of anemia    Childhood   History of cataract    History of kidney stones    History of migraine    resolved   Hypertension    states under control with med., has been on med. x 5 yr.   Insomnia    Low blood pressure    x3 hospitalized once for it   Memory difficulty 05/09/2018   MRSA (methicillin resistant staph aureus) culture positive 09/03/2018   Paresthesia 04/27/2015   Peripheral  neuropathy 11/06/2017   RLS (restless legs syndrome) 04/27/2015   Seasonal allergies    Sleep apnea    no CPAP use, REPORTS HE NEEDS A NEW ONE    Staph aureus infection 09/03/2018   TIA (transient ischemic attack)    Unilateral osteoarthritis of knee 12/2014   left    PAST SURGICAL HISTORY: Past Surgical History:  Procedure Laterality Date   CATARACT EXTRACTION, BILATERAL     COLONOSCOPY     CYSTOSCOPY WITH URETEROSCOPY AND STENT PLACEMENT Bilateral 09/08/2018   Procedure: CYSTOSCOPY WITH BILATERAL  URETEROSCOPY HOLMIUM LASER AND STENT PLACEMENT;  Surgeon: Lucas Mallow, MD;  Location[facility];  Service: Urology;  Laterality: Bilateral;   LAPAROSCOPIC NEPHRECTOMY, HAND ASSISTED Right 10/15/2018   Procedure: HAND ASSISTED LAPAROSCOPIC NEPHRECTOMY;  Surgeon: Lucas Mallow, MD;  [facility];  Service: Urology;  Laterality: Right;   PARTIAL KNEE ARTHROPLASTY Right 06/02/2013   Procedure: RIGHT UNICOMPARTMENTAL KNEE;  Surgeon: Johnny Bridge, MD;  Location: [place];  Service: Orthopedics;  Laterality: Right;   PARTIAL KNEE ARTHROPLASTY Left 12/17/2014   Procedure: LEFT KNEE UNI ARTHROPLASTY ;  Surgeon: Marchia Bond, MD;  Location: [place];  Service: Orthopedics;  Laterality: Left;   TONSILLECTOMY  1950's   UPPER GI ENDOSCOPY      FAMILY HISTORY: Family History  Problem Relation Age of Onset   Emphysema Father        Died age 66   COPD Father    Sudden death Mother        Died age 38   CAD Brother        Died age 54   Hypertension Brother     SOCIAL HISTORY: Social History   Socioeconomic History   Marital status: Married    Spouse name: Remo Lipps   Number of children: 2   Years of education: 14   Highest education level: Not on file  Occupational History   Occupation: self-employed  Tobacco Use   Smoking status: Former    Types: Pipe   Smokeless tobacco: Never   Tobacco comments:    06/02/2013   Vaping Use   Vaping Use: Never used  Substance and  Sexual Activity   Alcohol use: Yes    Comment: occasionally   Drug use: No   Sexual activity: Yes  Other Topics Concern   Not on file  Social History Narrative   Retiring from IT sales professional.     Patient drinks 1 cup of caffeine daily.   Patient is right handed.    Social Determinants of Health   Financial Resource Strain: Not on file  Food Insecurity: Not on file  Transportation Needs: Not on file  Physical Activity: Not on file  Stress: Not on file  Social Connections: Not on file  Intimate Partner Violence: Not on file      PHYSICAL EXAM  Vitals:   03/23/21 1058  BP: 128/82  Pulse: 89  Weight: 252 lb 6.4 oz (114.5 kg)  Height: 5\' 10"  (1.778 m)   Body mass index is 36.22 kg/m.  Generalized: Well developed, in no acute distress   Neurological examination  Mentation: Alert oriented to time, place, history taking. Follows all commands speech and language fluent Cranial nerve II-XII: Pupils were equal round reactive to light. Extraocular movements were full, visual field were full on confrontational test. Facial sensation and strength were normal. Uvula tongue midline. Head turning and shoulder shrug  were normal and symmetric. Motor: The motor testing reveals 5 over 5 strength of all 4 extremities. Good symmetric motor tone is noted throughout.  Sensory: Sensory testing is intact to soft touch on all 4 extremities. No evidence of extinction is noted.  Coordination: Cerebellar testing reveals good finger-nose-finger and heel-to-shin bilaterally.  Gait and station: Gait is normal.  Reflexes: Deep tendon reflexes are symmetric and normal bilaterally.   DIAGNOSTIC DATA (LABS, IMAGING, TESTING) - I reviewed patient records, labs, notes, testing and imaging myself where available.  Lab Results  Component Value Date   WBC 6.7 11/17/2020  HGB 12.2 (L) 11/17/2020   HCT 35.4 (L) 11/17/2020   MCV 96 11/17/2020   PLT 198 11/17/2020      Component Value Date/Time   NA 140  11/17/2020 1303   K 4.7 11/17/2020 1303   CL 102 11/17/2020 1303   CO2 25 11/17/2020 1303   GLUCOSE 123 (H) 11/17/2020 1303   GLUCOSE 111 (H) 10/16/2018 0426   BUN 19 11/17/2020 1303   CREATININE 1.06 11/17/2020 1303   CALCIUM 8.9 11/17/2020 1303   PROT 6.7 11/17/2020 1303   ALBUMIN 4.1 11/17/2020 1303   AST 45 (H) 11/17/2020 1303   ALT 31 11/17/2020 1303   ALKPHOS 113 11/17/2020 1303   BILITOT 0.4 11/17/2020 1303   GFRNONAA 70 09/14/2019 1523   GFRAA 81 09/14/2019 1523   No results found for: CHOL, HDL, LDLCALC, LDLDIRECT, TRIG, CHOLHDL Lab Results  Component Value Date   HGBA1C 6.6 (H) 09/14/2019   Lab Results  Component Value Date   [id number] 04/08/2017   No results found for: TSH    ASSESSMENT AND PLAN 75 y.o. year old male  has a past medical history of Diabetes mellitus without complication ([place]), GERD (gastroesophageal reflux disease), History of anemia, History of cataract, History of kidney stones, History of migraine, Hypertension, Insomnia, Low blood pressure, Memory difficulty (05/09/2018), MRSA (methicillin resistant staph aureus) culture positive (09/03/2018), Paresthesia (04/27/2015), Peripheral neuropathy (11/06/2017), RLS (restless legs syndrome) (04/27/2015), Seasonal allergies, Sleep apnea, Staph aureus infection (09/03/2018), TIA (transient ischemic attack), and Unilateral osteoarthritis of knee (12/2014). here with :  1.  Idiopathic neuropathy  Increase Lyrica to 200 mg 3 times a day (this is how the medication was originally prescribed.) Will not restart carbamazepine at this time due to history of altered mental status  2.  Restless leg syndrome  Continue Neupro patch 4 mg daily   Follow-up in 6 months with Judson Roch or sooner if needed     Ward Givens, MSN, NP-C 03/23/2021, 11:17 AM [facility] [address], [place], [place] [address] [phone]

## 2021-04-26 DIAGNOSIS — R42 Dizziness and giddiness: Secondary | ICD-10-CM | POA: Diagnosis not present

## 2021-04-26 DIAGNOSIS — Z299 Encounter for prophylactic measures, unspecified: Secondary | ICD-10-CM | POA: Diagnosis not present

## 2021-04-26 DIAGNOSIS — Z6836 Body mass index (BMI) 36.0-36.9, adult: Secondary | ICD-10-CM | POA: Diagnosis not present

## 2021-04-26 DIAGNOSIS — J069 Acute upper respiratory infection, unspecified: Secondary | ICD-10-CM | POA: Diagnosis not present

## 2021-04-26 DIAGNOSIS — R0981 Nasal congestion: Secondary | ICD-10-CM | POA: Diagnosis not present

## 2021-05-04 DIAGNOSIS — Z299 Encounter for prophylactic measures, unspecified: Secondary | ICD-10-CM | POA: Diagnosis not present

## 2021-05-04 DIAGNOSIS — I10 Essential (primary) hypertension: Secondary | ICD-10-CM | POA: Diagnosis not present

## 2021-05-04 DIAGNOSIS — R059 Cough, unspecified: Secondary | ICD-10-CM | POA: Diagnosis not present

## 2021-05-04 DIAGNOSIS — E1165 Type 2 diabetes mellitus with hyperglycemia: Secondary | ICD-10-CM | POA: Diagnosis not present

## 2021-05-04 DIAGNOSIS — Z87891 Personal history of nicotine dependence: Secondary | ICD-10-CM | POA: Diagnosis not present

## 2021-05-04 DIAGNOSIS — Z6837 Body mass index (BMI) 37.0-37.9, adult: Secondary | ICD-10-CM | POA: Diagnosis not present

## 2021-05-11 ENCOUNTER — Other Ambulatory Visit: Payer: Self-pay | Admitting: Neurology

## 2021-06-01 ENCOUNTER — Other Ambulatory Visit: Payer: Self-pay | Admitting: *Deleted

## 2021-06-01 NOTE — Telephone Encounter (Signed)
Received Rx request from [facility] for Pregabalin 200 mg. Pt last filled this on 04/20/21 #90/30. Last appt was 12.22.22 and next one will be on 6.22.23.  ?

## 2021-06-05 MED ORDER — PREGABALIN 200 MG PO CAPS: 200.0000 mg | ORAL_CAPSULE | Freq: Three times a day (TID) | ORAL | 0 refills | Status: DC

## 2021-06-11 ENCOUNTER — Other Ambulatory Visit: Payer: Self-pay | Admitting: Neurology

## 2021-06-13 ENCOUNTER — Other Ambulatory Visit: Payer: Self-pay | Admitting: Neurology

## 2021-06-13 NOTE — Telephone Encounter (Signed)
From last note 03/2021 from MM/NP did not restart carbamazepine, pt on pregabalin. Has appt 08-2021.  ?

## 2021-07-11 ENCOUNTER — Other Ambulatory Visit: Payer: Self-pay | Admitting: Adult Health

## 2021-07-11 MED ORDER — PREGABALIN 200 MG PO CAPS: 200.0000 mg | ORAL_CAPSULE | Freq: Three times a day (TID) | ORAL | 3 refills | Status: DC

## 2021-07-11 NOTE — Addendum Note (Signed)
Addended by: Oliver Hum S on: 07/11/2021 11:28 AM ? ? Modules accepted: Orders ? ?

## 2021-07-11 NOTE — Telephone Encounter (Signed)
At 10:22 [facility] Left a vm re: a refill renewal for pt's pregabalin (LYRICA) 200 MG capsule.  Summer stated pt informed her that Jinny Blossom, NP was sending a refill request for pt's pregabalin (LYRICA) 200 MG capsule.  Summer is asking that be sent over. ?

## 2021-07-11 NOTE — Telephone Encounter (Signed)
Last seen MM/NP 03-2021 '200mg'$  po tid pregabalin.  Has appt with SS/NP 08-2021.  ?

## 2021-08-11 DIAGNOSIS — I10 Essential (primary) hypertension: Secondary | ICD-10-CM | POA: Diagnosis not present

## 2021-08-11 DIAGNOSIS — E1165 Type 2 diabetes mellitus with hyperglycemia: Secondary | ICD-10-CM | POA: Diagnosis not present

## 2021-08-11 DIAGNOSIS — Z789 Other specified health status: Secondary | ICD-10-CM | POA: Diagnosis not present

## 2021-08-11 DIAGNOSIS — Z299 Encounter for prophylactic measures, unspecified: Secondary | ICD-10-CM | POA: Diagnosis not present

## 2021-08-31 DIAGNOSIS — C641 Malignant neoplasm of right kidney, except renal pelvis: Secondary | ICD-10-CM | POA: Diagnosis not present

## 2021-09-01 DIAGNOSIS — K529 Noninfective gastroenteritis and colitis, unspecified: Secondary | ICD-10-CM | POA: Diagnosis not present

## 2021-09-01 DIAGNOSIS — I2510 Atherosclerotic heart disease of native coronary artery without angina pectoris: Secondary | ICD-10-CM | POA: Diagnosis not present

## 2021-09-01 DIAGNOSIS — I700 Atherosclerosis of aorta: Secondary | ICD-10-CM | POA: Diagnosis not present

## 2021-09-01 DIAGNOSIS — C641 Malignant neoplasm of right kidney, except renal pelvis: Secondary | ICD-10-CM | POA: Diagnosis not present

## 2021-09-07 DIAGNOSIS — N3941 Urge incontinence: Secondary | ICD-10-CM | POA: Diagnosis not present

## 2021-09-07 DIAGNOSIS — C641 Malignant neoplasm of right kidney, except renal pelvis: Secondary | ICD-10-CM | POA: Diagnosis not present

## 2021-09-20 NOTE — Progress Notes (Signed)
Patient: Jason [name] Date of Birth: 1946/01/05  Reason for Visit: Follow up for neuropathy, RLS History from: Patient, wife  Primary Neurologist: Dr. Krista Blue  ASSESSMENT AND PLAN 76 y.o. year old male   1.  Small fiber neuropathy, chronic pain 2.  Restless leg syndrome -Recommend Lyrica 200 mg 3 times daily, will check with pharmacy, reportedly only filling enough to take twice daily -Continue Cymbalta 60 mg daily -Refill tramadol to take only as needed for severe pain (last fill was 11/28/20 # 60) -Continue Neupro 4 mg patch, only use Requip for severe breakthrough 3.  Chronic low back pain -Recommend follow-up with Dr. Marcello Moores, for evaluation of options for pain management vs surgical intervention 4.  Severe OSA -Not on CPAP, have recommended restarting, if he does so he will let me know  HISTORY OF PRESENT ILLNESS: Today 09/21/21 Jason [name] is here today for follow-up.  Has remained off carbamazepine after a questionable medication overdose last year.  On Lyrica 200 mg 3 times a day (but has only been taking 2, pharmacy only give 2 a day). On Neupro, 4 mg patch, this really helps. On Cymbalta 60 mg BID. Has had worsening neuropathy pain, slowly worsening overtime, pain/burning sensation. Is worsening up to his knees. Saw Dr. Marcello Moores last year. Increased low back pain. If he cooks, leans forward, has to rest after due to neck pain. Very rarely takes Requip. Has tramadol to take PRN, is almost out. Most bothersome is the neuropathy. 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 Has been told he has sleep apnea, but doesn't use CPAP. Severe OSA back in July 2021.  HISTORY  03/23/2021 MM: Jason [name] is a 76 year old male with a history of small fiber neuropathy and restless leg syndrome.  He returns today for follow-up.  The patient states that he was hospitalized several months ago for altered mental status.  Dr. Jannifer Franklin felt that it was medication overdose.   However the patient is now stating that his son counted his meds after the fact and he did not take any additional medication.  The patient has not restarted carbamazepine.  He is only been taking Lyrica 200 mg twice a day.  He states that since he has not restarted carbamazepine and taking a lower dose of Lyrica he has sharp shooting pains in the legs.  It is not constant but does occur very frequently.  He is hoping to restart carbamazepine or increase Lyrica.  He continues on Neupro patch for restless legs.  He has Requip but takes it infrequently.  Only for breakthrough symptoms.  He returns today for an evaluation.  REVIEW OF SYSTEMS: Out of a complete 14 system review of symptoms, the patient complains only of the following symptoms, and all other reviewed systems are negative.  See HPI  ALLERGIES: Allergies  Allergen Reactions   Augmentin [Amoxicillin-Pot Clavulanate] Nausea Only    Did it involve swelling of the face/tongue/throat, SOB, or low BP? No Did it involve sudden or severe rash/hives, skin peeling, or any reaction on the inside of your mouth or nose? No Did you need to seek medical attention at a hospital or doctor's office? No When did it last happen?15-20 years ago If all above answers are "NO", may proceed with cephalosporin use.    Penicillins Nausea Only    Did it involve swelling of the face/tongue/throat, SOB, or low BP? No Did it involve sudden or severe rash/hives, skin peeling, or  any reaction on the inside of your mouth or nose? No Did you need to seek medical attention at a hospital or doctor's office? No When did it last happen?10-15 years ago If all above answers are "NO", may proceed with cephalosporin use.     Clavulanic Acid     HOME MEDICATIONS: Outpatient Medications Prior to Visit  Medication Sig Dispense Refill   aspirin EC 81 MG tablet Take 81 mg by mouth every evening.     [employer] FIBER GUMMIES PO Take 2 tablets by mouth daily.     DULoxetine  (CYMBALTA) 60 MG capsule TAKE ONE CAPSULE BY MOUTH TWICE DAILY 180 capsule 3   lisinopril (ZESTRIL) 2.5 MG tablet      metoprolol tartrate (LOPRESSOR) 25 MG tablet Take 25 mg by mouth every evening.      Multiple Vitamins-Minerals (ADULT GUMMY PO) Take 2 tablets by mouth daily.     NEUPRO 4 MG/24HR place ONE PATCH onto THE SKIN DAILY 30 patch 5   oxybutynin (DITROPAN) 5 MG tablet Take 1 tablet (5 mg total) by mouth 3 (three) times daily. 15 tablet 3   pantoprazole (PROTONIX) 40 MG tablet Take 40 mg by mouth daily.     Polyethyl Glycol-Propyl Glycol 0.4-0.3 % SOLN Place 1 drop into both eyes 3 (three) times daily as needed (dry eyes).      pregabalin (LYRICA) 200 MG capsule Take 1 capsule (200 mg total) by mouth 3 (three) times daily. 90 capsule 3   promethazine (PHENERGAN) 25 MG tablet Take 12.5 mg by mouth every 6 (six) hours as needed for nausea.     rOPINIRole (REQUIP) 2 MG tablet TAKE 2 TABLETS BY MOUTH EVERY DAY AS NEEDED FOR BREAKTHROUGH PAIN 60 tablet 5   rosuvastatin (CRESTOR) 10 MG tablet Take 10 mg by mouth daily.     tamsulosin (FLOMAX) 0.4 MG CAPS capsule Take 0.4 mg by mouth daily.     traMADol (ULTRAM) 50 MG tablet TAKE 1 TABLET BY MOUTH EVERY 6 HOURS AS NEEDED 60 tablet 1   carbamazepine (TEGRETOL) 200 MG tablet TAKE 1 TABLET BY MOUTH TWICE DAILY (Patient not taking: Reported on 03/23/2021) 60 tablet 5   ELDERBERRY PO Take 2 tablets by mouth daily. Gummies     No facility-administered medications prior to visit.    PAST MEDICAL HISTORY: Past Medical History:  Diagnosis Date   Diabetes mellitus without complication (HCC)    GERD (gastroesophageal reflux disease)    History of anemia    Childhood   History of cataract    History of kidney stones    History of migraine    resolved   Hypertension    states under control with med., has been on med. x 5 yr.   Insomnia    Low blood pressure    x3 hospitalized once for it   Memory difficulty 05/09/2018   MRSA (methicillin  resistant staph aureus) culture positive 09/03/2018   Paresthesia 04/27/2015   Peripheral neuropathy 11/06/2017   RLS (restless legs syndrome) 04/27/2015   Seasonal allergies    Sleep apnea    no CPAP use, REPORTS HE NEEDS A NEW ONE    Staph aureus infection 09/03/2018   TIA (transient ischemic attack)    Unilateral osteoarthritis of knee 12/2014   left    PAST SURGICAL HISTORY: Past Surgical History:  Procedure Laterality Date   CATARACT EXTRACTION, BILATERAL     COLONOSCOPY     CYSTOSCOPY WITH URETEROSCOPY AND STENT PLACEMENT Bilateral 09/08/2018  Procedure: CYSTOSCOPY WITH BILATERAL  URETEROSCOPY HOLMIUM LASER AND STENT PLACEMENT;  Surgeon: Lucas Mallow, MD;  Location: [facility];  Service: Urology;  Laterality: Bilateral;   LAPAROSCOPIC NEPHRECTOMY, HAND ASSISTED Right 10/15/2018   Procedure: HAND ASSISTED LAPAROSCOPIC NEPHRECTOMY;  Surgeon: Lucas Mallow, MD;  Location: [facility];  Service: Urology;  Laterality: Right;   PARTIAL KNEE ARTHROPLASTY Right 06/02/2013   Procedure: RIGHT UNICOMPARTMENTAL KNEE;  Surgeon: Johnny Bridge, MD;  Location: [place];  Service: Orthopedics;  Laterality: Right;   PARTIAL KNEE ARTHROPLASTY Left 12/17/2014   Procedure: LEFT KNEE UNI ARTHROPLASTY ;  Surgeon: Marchia Bond, MD;  Location: [place];  Service: Orthopedics;  Laterality: Left;   TONSILLECTOMY  1950's   UPPER GI ENDOSCOPY      FAMILY HISTORY: Family History  Problem Relation Age of Onset   Emphysema Father        Died age 69   COPD Father    Sudden death Mother        Died age 76   CAD Brother        Died age 47   Hypertension Brother     SOCIAL HISTORY: Social History   Socioeconomic History   Marital status: Married    Spouse name: Remo Lipps   Number of children: 2   Years of education: 14   Highest education level: Not on file  Occupational History   Occupation: self-employed  Tobacco Use   Smoking status: Former    Types: Pipe   Smokeless tobacco: Never    Tobacco comments:    06/02/2013   Vaping Use   Vaping Use: Never used  Substance and Sexual Activity   Alcohol use: Yes    Comment: occasionally   Drug use: No   Sexual activity: Yes  Other Topics Concern   Not on file  Social History Narrative   Retiring from IT sales professional.     Patient drinks 1 cup of caffeine daily.   Patient is right handed.    Social Determinants of Health   Financial Resource Strain: Not on file  Food Insecurity: Not on file  Transportation Needs: Not on file  Physical Activity: Not on file  Stress: Not on file  Social Connections: Not on file  Intimate Partner Violence: Not on file   PHYSICAL EXAM  Vitals:   09/21/21 0732  BP: 102/69  Pulse: 72  Weight: 250 lb 8 oz (113.6 kg)  Height: '5\' 10"'$  (1.778 m)   Body mass index is 35.94 kg/m.  Generalized: Well developed, in no acute distress  Neurological examination  Mentation: Alert oriented to time, place, history taking. Follows all commands speech and language fluent Cranial nerve II-XII: Pupils were equal round reactive to light. Extraocular movements were full, visual field were full on confrontational test. Facial sensation and strength were normal. Head turning and shoulder shrug  were normal and symmetric. Motor: The motor testing reveals 5 over 5 strength of all 4 extremities. Good symmetric motor tone is noted throughout.  Sensory: Length dependent sensory deficit lower extremities Coordination: Cerebellar testing reveals good finger-nose-finger and heel-to-shin bilaterally.  Gait and station: Gait is antalgic, no assistive device, trouble with tip toe walk but with heel, gait is flat footed   Reflexes: Deep tendon reflexes are symmetric and normal bilaterally.   DIAGNOSTIC DATA (LABS, IMAGING, TESTING) - I reviewed patient records, labs, notes, testing and imaging myself where available.  Lab Results  Component Value Date   WBC 6.7 11/17/2020  HGB 12.2 (L) 11/17/2020   HCT 35.4 (L)  11/17/2020   MCV 96 11/17/2020   PLT 198 11/17/2020      Component Value Date/Time   NA 140 11/17/2020 1303   K 4.7 11/17/2020 1303   CL 102 11/17/2020 1303   CO2 25 11/17/2020 1303   GLUCOSE 123 (H) 11/17/2020 1303   GLUCOSE 111 (H) 10/16/2018 0426   BUN 19 11/17/2020 1303   CREATININE 1.06 11/17/2020 1303   CALCIUM 8.9 11/17/2020 1303   PROT 6.7 11/17/2020 1303   ALBUMIN 4.1 11/17/2020 1303   AST 45 (H) 11/17/2020 1303   ALT 31 11/17/2020 1303   ALKPHOS 113 11/17/2020 1303   BILITOT 0.4 11/17/2020 1303   GFRNONAA 70 09/14/2019 1523   GFRAA 81 09/14/2019 1523   No results found for: "CHOL", "HDL", "LDLCALC", "LDLDIRECT", "TRIG", "CHOLHDL" Lab Results  Component Value Date   HGBA1C 6.6 (H) 09/14/2019   Lab Results  Component Value Date   HALPFXTK24 097 04/08/2017   No results found for: "TSH"  Butler Denmark, AGNP-C, DNP 09/21/2021, 7:56 AM [facility] [address], [place], [place] [address] [phone]

## 2021-09-21 ENCOUNTER — Telehealth: Payer: Self-pay | Admitting: Neurology

## 2021-09-21 ENCOUNTER — Ambulatory Visit: Payer: Medicare PPO | Admitting: Neurology

## 2021-09-21 ENCOUNTER — Encounter: Payer: Self-pay | Admitting: Neurology

## 2021-09-21 VITALS — BP 102/69 | HR 72 | Ht 70.0 in | Wt 250.5 lb

## 2021-09-21 DIAGNOSIS — G603 Idiopathic progressive neuropathy: Secondary | ICD-10-CM

## 2021-09-21 DIAGNOSIS — G2581 Restless legs syndrome: Secondary | ICD-10-CM

## 2021-09-21 DIAGNOSIS — M545 Low back pain, unspecified: Secondary | ICD-10-CM | POA: Diagnosis not present

## 2021-09-21 DIAGNOSIS — G8929 Other chronic pain: Secondary | ICD-10-CM

## 2021-09-21 MED ORDER — TRAMADOL HCL 50 MG PO TABS: 50.0000 mg | ORAL_TABLET | Freq: Four times a day (QID) | ORAL | 0 refills | Status: DC | PRN

## 2021-09-21 MED ORDER — NEUPRO 4 MG/24HR TD PT24: MEDICATED_PATCH | TRANSDERMAL | 5 refills | Status: DC

## 2021-09-21 NOTE — Telephone Encounter (Signed)
Please call patient's pharmacy, [facility]. He is prescribed Lyrica 200 mg 3 times daily, he claims they are only dispensing enough for him to take twice daily. Is there a glitch on this, he should be refilled enough to take 3 times daily. I will send new script if needed.

## 2021-09-21 NOTE — Telephone Encounter (Signed)
I called the patient, let him know, I left a message.

## 2021-09-21 NOTE — Patient Instructions (Addendum)
Start taking Lyrica 200 mg 3 times daily Recommend starting to use CPAP Keep Cymbalta, Neupro Use Requip as needed, tramadol only for severe pain Follow up with Dr. Marcello Moores about back pain  See you back in 4 months

## 2021-09-21 NOTE — Telephone Encounter (Signed)
I spoke with Juliann Pulse at [facility]. She was able to confirm the patient's prescription was last filled on 07/11/2021 #90 tablets for 30 days. He has 3 refills left on file.

## 2021-11-22 DIAGNOSIS — E1165 Type 2 diabetes mellitus with hyperglycemia: Secondary | ICD-10-CM | POA: Diagnosis not present

## 2021-11-22 DIAGNOSIS — Z299 Encounter for prophylactic measures, unspecified: Secondary | ICD-10-CM | POA: Diagnosis not present

## 2021-11-22 DIAGNOSIS — Z6838 Body mass index (BMI) 38.0-38.9, adult: Secondary | ICD-10-CM | POA: Diagnosis not present

## 2021-11-22 DIAGNOSIS — E7800 Pure hypercholesterolemia, unspecified: Secondary | ICD-10-CM | POA: Diagnosis not present

## 2021-11-22 DIAGNOSIS — I10 Essential (primary) hypertension: Secondary | ICD-10-CM | POA: Diagnosis not present

## 2021-11-27 DIAGNOSIS — E119 Type 2 diabetes mellitus without complications: Secondary | ICD-10-CM | POA: Diagnosis not present

## 2021-11-27 NOTE — Progress Notes (Signed)
Chart reviewed, agree above plan ?

## 2021-12-13 DIAGNOSIS — Z23 Encounter for immunization: Secondary | ICD-10-CM | POA: Diagnosis not present

## 2021-12-25 ENCOUNTER — Other Ambulatory Visit: Payer: Self-pay | Admitting: Neurology

## 2021-12-27 NOTE — Telephone Encounter (Signed)
[facility] Verified -Pregabalin 200 Mg Capsule LR:12/05/2021 Qty: 90 for 30 days  Last OV: 09/21/21 Pending appointment: 01/20/22

## 2022-01-23 DIAGNOSIS — Z299 Encounter for prophylactic measures, unspecified: Secondary | ICD-10-CM | POA: Diagnosis not present

## 2022-01-23 DIAGNOSIS — Z79899 Other long term (current) drug therapy: Secondary | ICD-10-CM | POA: Diagnosis not present

## 2022-01-23 DIAGNOSIS — R5383 Other fatigue: Secondary | ICD-10-CM | POA: Diagnosis not present

## 2022-01-23 DIAGNOSIS — E7800 Pure hypercholesterolemia, unspecified: Secondary | ICD-10-CM | POA: Diagnosis not present

## 2022-01-23 DIAGNOSIS — Z1331 Encounter for screening for depression: Secondary | ICD-10-CM | POA: Diagnosis not present

## 2022-01-23 DIAGNOSIS — Z125 Encounter for screening for malignant neoplasm of prostate: Secondary | ICD-10-CM | POA: Diagnosis not present

## 2022-01-23 DIAGNOSIS — Z6838 Body mass index (BMI) 38.0-38.9, adult: Secondary | ICD-10-CM | POA: Diagnosis not present

## 2022-01-23 DIAGNOSIS — Z1339 Encounter for screening examination for other mental health and behavioral disorders: Secondary | ICD-10-CM | POA: Diagnosis not present

## 2022-01-23 DIAGNOSIS — Z0000 Encounter for general adult medical examination without abnormal findings: Secondary | ICD-10-CM | POA: Diagnosis not present

## 2022-01-23 DIAGNOSIS — Z7189 Other specified counseling: Secondary | ICD-10-CM | POA: Diagnosis not present

## 2022-01-24 ENCOUNTER — Telehealth: Payer: Self-pay | Admitting: Neurology

## 2022-01-24 NOTE — Telephone Encounter (Signed)
LVM and sent mychart msg informing pt of r/s needed for 10/31 appt- NP out.

## 2022-01-30 ENCOUNTER — Ambulatory Visit: Payer: Medicare PPO | Admitting: Neurology

## 2022-01-31 ENCOUNTER — Ambulatory Visit: Payer: Medicare PPO | Admitting: Neurology

## 2022-01-31 ENCOUNTER — Telehealth: Payer: Self-pay | Admitting: Neurology

## 2022-01-31 ENCOUNTER — Encounter: Payer: Self-pay | Admitting: Neurology

## 2022-01-31 VITALS — BP 120/82 | HR 61 | Ht 70.0 in | Wt 254.0 lb

## 2022-01-31 DIAGNOSIS — G603 Idiopathic progressive neuropathy: Secondary | ICD-10-CM

## 2022-01-31 DIAGNOSIS — G8929 Other chronic pain: Secondary | ICD-10-CM | POA: Diagnosis not present

## 2022-01-31 DIAGNOSIS — G2581 Restless legs syndrome: Secondary | ICD-10-CM

## 2022-01-31 DIAGNOSIS — M545 Low back pain, unspecified: Secondary | ICD-10-CM | POA: Diagnosis not present

## 2022-01-31 MED ORDER — DULOXETINE HCL 60 MG PO CPEP: 60.0000 mg | ORAL_CAPSULE | Freq: Two times a day (BID) | ORAL | 3 refills | Status: DC

## 2022-01-31 MED ORDER — TRAMADOL HCL 50 MG PO TABS: 50.0000 mg | ORAL_TABLET | Freq: Four times a day (QID) | ORAL | 0 refills | Status: DC | PRN

## 2022-01-31 MED ORDER — NEUPRO 4 MG/24HR TD PT24: MEDICATED_PATCH | TRANSDERMAL | 5 refills | Status: DC

## 2022-01-31 NOTE — Patient Instructions (Addendum)
I will refer you to [facility] for evaluation of low back pain and neuropathy   We will continue your current medications   Use tramadol only as needed   Recommend restarting CPAP, when you restart please let me know  See you back in 6 months

## 2022-01-31 NOTE — Telephone Encounter (Signed)
Referral sent to [facility], phone # [phone].

## 2022-01-31 NOTE — Progress Notes (Addendum)
Patient: Jason [name] Date of Birth: [birth year]  Reason for Visit: Follow up for neuropathy, RLS History from: Patient, wife  Primary Neurologist: Dr. Krista Blue  ASSESSMENT AND PLAN 76 y.o. year old male  With 1.  Small fiber neuropathy, chronic pain 2.  Restless leg syndrome 3.  Chronic low back pain 4.  Severe OSA -Referral to [facility] for management options of chronic back pain, neuropathy -For now, continue Lyrica 200 mg 3 times daily -Continue Cymbalta 60 mg daily -I refilled tramadol to take as needed for severe pain, last refill was in June -Continue Neupro patch 4 mg/24 hours, uses Requip rarely for breakthrough pain -Reportedly has seen Dr. Marcello Moores neurosurgery for low back pain, has not been felt a surgical candidate -Not using CPAP, have encouraged to restart, once he is using again, let me know, I can pull data to ensure treatment is adequate -Follow-up in 6 months or sooner if needed  Addendum 03/01/2022 SS: seen at [facility] 02/19/22, plan PT for neck and back, cervical and lumbar ESI.  Follow-up in 6 to 8 weeks after PT.  HISTORY OF PRESENT ILLNESS: Today 01/31/22 Jason [name] is here today for follow-up. Is prescribed Lyrica 200 mg 3 times daily, but only takes 2 times daily. Has been taking mostly 3 times a day since increased pain to feet. Lyrica does help for sure. On Cymbalta 60 mg BID, Neupro patch 4 mg daily, takes Requip once a month, Tramadol PRN last filled in June. Has increased sharp pains to his feet from neuropathy. Has CPAP uses occasionally, I pulled download, don't see any data other than sporadic night or 2 in Sept. He bought 2 yorkie dogs, he loves them. He uses his medications sparingly. Uses cane if needed, 1 fall, no major. Has not seen Dr. Marcello Moores for follow-up. Has been told borderline diabetic.   Update 09/21/21  SS: Jason [name] is here today for follow-up.  Has remained off carbamazepine after a questionable medication overdose  last year.  On Lyrica 200 mg 3 times a day (but has only been taking 2, pharmacy only give 2 a day). On Neupro, 4 mg patch, this really helps. On Cymbalta 60 mg BID. Has had worsening neuropathy pain, slowly worsening overtime, pain/burning sensation. Is worsening up to his knees. Saw Dr. Marcello Moores last year. Increased low back pain. If he cooks, leans forward, has to rest after due to neck pain. Very rarely takes Requip. Has tramadol to take PRN, is almost out. Most bothersome is the neuropathy. 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 Has been told he has sleep apnea, but doesn't use CPAP. Severe OSA back in July 2021.  HISTORY  03/23/2021 MM: Jason [name] is a 76 year old male with a history of small fiber neuropathy and restless leg syndrome.  He returns today for follow-up.  The patient states that he was hospitalized several months ago for altered mental status.  Dr. Jannifer Franklin felt that it was medication overdose.  However the patient is now stating that his son counted his meds after the fact and he did not take any additional medication.  The patient has not restarted carbamazepine.  He is only been taking Lyrica 200 mg twice a day.  He states that since he has not restarted carbamazepine and taking a lower dose of Lyrica he has sharp shooting pains in the legs.  It is not constant but does occur very frequently.  He is  hoping to restart carbamazepine or increase Lyrica.  He continues on Neupro patch for restless legs.  He has Requip but takes it infrequently.  Only for breakthrough symptoms.  He returns today for an evaluation.  REVIEW OF SYSTEMS: Out of a complete 14 system review of symptoms, the patient complains only of the following symptoms, and all other reviewed systems are negative.  See HPI  ALLERGIES: Allergies  Allergen Reactions   Augmentin [Amoxicillin-Pot Clavulanate] Nausea Only    Did it involve swelling of the face/tongue/throat, SOB, or low BP? No Did  it involve sudden or severe rash/hives, skin peeling, or any reaction on the inside of your mouth or nose? No Did you need to seek medical attention at a hospital or doctor's office? No When did it last happen?15-20 years ago If all above answers are "NO", may proceed with cephalosporin use.    Penicillins Nausea Only    Did it involve swelling of the face/tongue/throat, SOB, or low BP? No Did it involve sudden or severe rash/hives, skin peeling, or any reaction on the inside of your mouth or nose? No Did you need to seek medical attention at a hospital or doctor's office? No When did it last happen?10-15 years ago If all above answers are "NO", may proceed with cephalosporin use.     Clavulanic Acid     HOME MEDICATIONS: Outpatient Medications Prior to Visit  Medication Sig Dispense Refill   aspirin EC 81 MG tablet Take 81 mg by mouth every evening.     [employer] FIBER GUMMIES PO Take 2 tablets by mouth daily.     DULoxetine (CYMBALTA) 60 MG capsule TAKE ONE CAPSULE BY MOUTH TWICE DAILY 180 capsule 3   lisinopril (ZESTRIL) 2.5 MG tablet      metoprolol tartrate (LOPRESSOR) 25 MG tablet Take 25 mg by mouth every evening.      Multiple Vitamins-Minerals (ADULT GUMMY PO) Take 2 tablets by mouth daily.     oxybutynin (DITROPAN) 5 MG tablet Take 1 tablet (5 mg total) by mouth 3 (three) times daily. 15 tablet 3   pantoprazole (PROTONIX) 40 MG tablet Take 40 mg by mouth daily.     Polyethyl Glycol-Propyl Glycol 0.4-0.3 % SOLN Place 1 drop into both eyes 3 (three) times daily as needed (dry eyes).      pregabalin (LYRICA) 200 MG capsule TAKE 1 CAPSULE BY MOUTH THREE TIMES DAILY 90 capsule 3   promethazine (PHENERGAN) 25 MG tablet Take 12.5 mg by mouth every 6 (six) hours as needed for nausea.     rOPINIRole (REQUIP) 2 MG tablet TAKE 2 TABLETS BY MOUTH EVERY DAY AS NEEDED FOR BREAKTHROUGH PAIN 60 tablet 5   rosuvastatin (CRESTOR) 10 MG tablet Take 10 mg by mouth daily.     rotigotine (NEUPRO) 4  MG/24HR place ONE PATCH onto THE SKIN DAILY 30 patch 5   tamsulosin (FLOMAX) 0.4 MG CAPS capsule Take 0.4 mg by mouth daily.     traMADol (ULTRAM) 50 MG tablet Take 1 tablet (50 mg total) by mouth every 6 (six) hours as needed. 30 tablet 0   No facility-administered medications prior to visit.    PAST MEDICAL HISTORY: Past Medical History:  Diagnosis Date   Diabetes mellitus without complication (HCC)    GERD (gastroesophageal reflux disease)    History of anemia    Childhood   History of cataract    History of kidney stones    History of migraine    resolved   Hypertension  states under control with med., has been on med. x 5 yr.   Insomnia    Low blood pressure    x3 hospitalized once for it   Memory difficulty 05/09/2018   MRSA (methicillin resistant staph aureus) culture positive 09/03/2018   Paresthesia 04/27/2015   Peripheral neuropathy 11/06/2017   RLS (restless legs syndrome) 04/27/2015   Seasonal allergies    Sleep apnea    no CPAP use, REPORTS HE NEEDS A NEW ONE    Staph aureus infection 09/03/2018   TIA (transient ischemic attack)    Unilateral osteoarthritis of knee 12/2014   left    PAST SURGICAL HISTORY: Past Surgical History:  Procedure Laterality Date   CATARACT EXTRACTION, BILATERAL     COLONOSCOPY     CYSTOSCOPY WITH URETEROSCOPY AND STENT PLACEMENT Bilateral 09/08/2018   Procedure: CYSTOSCOPY WITH BILATERAL  URETEROSCOPY HOLMIUM LASER AND STENT PLACEMENT;  Surgeon: Lucas Mallow, MD;  Location: [facility];  Service: Urology;  Laterality: Bilateral;   LAPAROSCOPIC NEPHRECTOMY, HAND ASSISTED Right 10/15/2018   Procedure: HAND ASSISTED LAPAROSCOPIC NEPHRECTOMY;  Surgeon: Lucas Mallow, MD;  Location: [facility];  Service: Urology;  Laterality: Right;   PARTIAL KNEE ARTHROPLASTY Right 06/02/2013   Procedure: RIGHT UNICOMPARTMENTAL KNEE;  Surgeon: Johnny Bridge, MD;  Location: [place];  Service: Orthopedics;  Laterality: Right;   PARTIAL KNEE ARTHROPLASTY Left  12/17/2014   Procedure: LEFT KNEE UNI ARTHROPLASTY ;  Surgeon: Marchia Bond, MD;  Location: [place];  Service: Orthopedics;  Laterality: Left;   TONSILLECTOMY  1950's   UPPER GI ENDOSCOPY      FAMILY HISTORY: Family History  Problem Relation Age of Onset   Emphysema Father        Died age 48   COPD Father    Sudden death Mother        Died age 43   CAD Brother        Died age 63   Hypertension Brother     SOCIAL HISTORY: Social History   Socioeconomic History   Marital status: Married    Spouse name: Remo Lipps   Number of children: 2   Years of education: 14   Highest education level: Not on file  Occupational History   Occupation: self-employed  Tobacco Use   Smoking status: Former    Types: Pipe   Smokeless tobacco: Never   Tobacco comments:    06/02/2013   Vaping Use   Vaping Use: Never used  Substance and Sexual Activity   Alcohol use: Yes    Comment: occasionally   Drug use: No   Sexual activity: Yes  Other Topics Concern   Not on file  Social History Narrative   Retiring from IT sales professional.     Patient drinks 1 cup of caffeine daily.   Patient is right handed.    Social Determinants of Health   Financial Resource Strain: Not on file  Food Insecurity: Not on file  Transportation Needs: Not on file  Physical Activity: Not on file  Stress: Not on file  Social Connections: Not on file  Intimate Partner Violence: Not on file   PHYSICAL EXAM  Vitals:   01/31/22 1248  BP: 120/82  Pulse: 61  Weight: 254 lb (115.2 kg)  Height: '5\' 10"'$  (1.778 m)    Body mass index is 36.45 kg/m.  Generalized: Well developed, in no acute distress  Neurological examination  Mentation: Alert oriented to time, place, history taking. Follows all commands speech and  language fluent Cranial nerve II-XII: Pupils were equal round reactive to light. Extraocular movements were full, visual field were full on confrontational test. Facial sensation and strength were normal.  Head turning and shoulder shrug  were normal and symmetric. Motor: The motor testing reveals 5 over 5 strength of all 4 extremities. Good symmetric motor tone is noted throughout.  Sensory: Length dependent sensory deficit lower extremities to mid shin area, redness to feet more on the left Coordination: Cerebellar testing reveals good finger-nose-finger and heel-to-shin bilaterally.  Gait and station: Gait is antalgic, no assistive device, unsteady with tiptoe and heel walk, walks flat-footed, tandem gait is unsteady Reflexes: Deep tendon reflexes are symmetric and normal bilaterally.   DIAGNOSTIC DATA (LABS, IMAGING, TESTING) - I reviewed patient records, labs, notes, testing and imaging myself where available.  Lab Results  Component Value Date   WBC 6.7 11/17/2020   HGB 12.2 (L) 11/17/2020   HCT 35.4 (L) 11/17/2020   MCV 96 11/17/2020   PLT 198 11/17/2020      Component Value Date/Time   NA 140 11/17/2020 1303   K 4.7 11/17/2020 1303   CL 102 11/17/2020 1303   CO2 25 11/17/2020 1303   GLUCOSE 123 (H) 11/17/2020 1303   GLUCOSE 111 (H) 10/16/2018 0426   BUN 19 11/17/2020 1303   CREATININE 1.06 11/17/2020 1303   CALCIUM 8.9 11/17/2020 1303   PROT 6.7 11/17/2020 1303   ALBUMIN 4.1 11/17/2020 1303   AST 45 (H) 11/17/2020 1303   ALT 31 11/17/2020 1303   ALKPHOS 113 11/17/2020 1303   BILITOT 0.4 11/17/2020 1303   GFRNONAA 70 09/14/2019 1523   GFRAA 81 09/14/2019 1523   No results found for: "CHOL", "HDL", "LDLCALC", "LDLDIRECT", "TRIG", "CHOLHDL" Lab Results  Component Value Date   HGBA1C 6.6 (H) 09/14/2019   Lab Results  Component Value Date   [id number] 712 04/08/2017   No results found for: "TSH"  Butler Denmark, AGNP-C, DNP 01/31/2022, 12:54 PM [facility] [address] [place], [place] [address] [phone]

## 2022-02-19 DIAGNOSIS — M792 Neuralgia and neuritis, unspecified: Secondary | ICD-10-CM | POA: Diagnosis not present

## 2022-02-19 DIAGNOSIS — M5416 Radiculopathy, lumbar region: Secondary | ICD-10-CM | POA: Diagnosis not present

## 2022-02-19 DIAGNOSIS — M5412 Radiculopathy, cervical region: Secondary | ICD-10-CM | POA: Diagnosis not present

## 2022-03-15 DIAGNOSIS — I10 Essential (primary) hypertension: Secondary | ICD-10-CM | POA: Diagnosis not present

## 2022-03-15 DIAGNOSIS — Z299 Encounter for prophylactic measures, unspecified: Secondary | ICD-10-CM | POA: Diagnosis not present

## 2022-03-15 DIAGNOSIS — E1165 Type 2 diabetes mellitus with hyperglycemia: Secondary | ICD-10-CM | POA: Diagnosis not present

## 2022-03-15 DIAGNOSIS — Z713 Dietary counseling and surveillance: Secondary | ICD-10-CM | POA: Diagnosis not present

## 2022-04-03 DIAGNOSIS — M5412 Radiculopathy, cervical region: Secondary | ICD-10-CM | POA: Diagnosis not present

## 2022-04-03 DIAGNOSIS — R2681 Unsteadiness on feet: Secondary | ICD-10-CM | POA: Diagnosis not present

## 2022-04-03 DIAGNOSIS — M6281 Muscle weakness (generalized): Secondary | ICD-10-CM | POA: Diagnosis not present

## 2022-04-03 DIAGNOSIS — M5416 Radiculopathy, lumbar region: Secondary | ICD-10-CM | POA: Diagnosis not present

## 2022-04-03 DIAGNOSIS — M792 Neuralgia and neuritis, unspecified: Secondary | ICD-10-CM | POA: Diagnosis not present

## 2022-04-03 DIAGNOSIS — Z79891 Long term (current) use of opiate analgesic: Secondary | ICD-10-CM | POA: Diagnosis not present

## 2022-04-03 DIAGNOSIS — M545 Low back pain, unspecified: Secondary | ICD-10-CM | POA: Diagnosis not present

## 2022-04-03 DIAGNOSIS — M542 Cervicalgia: Secondary | ICD-10-CM | POA: Diagnosis not present

## 2022-04-05 DIAGNOSIS — M5416 Radiculopathy, lumbar region: Secondary | ICD-10-CM | POA: Diagnosis not present

## 2022-04-05 DIAGNOSIS — M792 Neuralgia and neuritis, unspecified: Secondary | ICD-10-CM | POA: Diagnosis not present

## 2022-04-05 DIAGNOSIS — M6281 Muscle weakness (generalized): Secondary | ICD-10-CM | POA: Diagnosis not present

## 2022-04-05 DIAGNOSIS — R2681 Unsteadiness on feet: Secondary | ICD-10-CM | POA: Diagnosis not present

## 2022-04-05 DIAGNOSIS — M5412 Radiculopathy, cervical region: Secondary | ICD-10-CM | POA: Diagnosis not present

## 2022-04-09 ENCOUNTER — Telehealth: Payer: Self-pay | Admitting: Neurology

## 2022-04-09 NOTE — Telephone Encounter (Signed)
LVM and sent mychart msg informing pt of need to reschedule 08/09/22 appointment - NP out

## 2022-04-10 DIAGNOSIS — M5416 Radiculopathy, lumbar region: Secondary | ICD-10-CM | POA: Diagnosis not present

## 2022-04-10 DIAGNOSIS — M6281 Muscle weakness (generalized): Secondary | ICD-10-CM | POA: Diagnosis not present

## 2022-04-10 DIAGNOSIS — M792 Neuralgia and neuritis, unspecified: Secondary | ICD-10-CM | POA: Diagnosis not present

## 2022-04-10 DIAGNOSIS — M5412 Radiculopathy, cervical region: Secondary | ICD-10-CM | POA: Diagnosis not present

## 2022-04-10 DIAGNOSIS — R2681 Unsteadiness on feet: Secondary | ICD-10-CM | POA: Diagnosis not present

## 2022-04-12 DIAGNOSIS — M792 Neuralgia and neuritis, unspecified: Secondary | ICD-10-CM | POA: Diagnosis not present

## 2022-04-12 DIAGNOSIS — R2681 Unsteadiness on feet: Secondary | ICD-10-CM | POA: Diagnosis not present

## 2022-04-12 DIAGNOSIS — M6281 Muscle weakness (generalized): Secondary | ICD-10-CM | POA: Diagnosis not present

## 2022-04-12 DIAGNOSIS — M5416 Radiculopathy, lumbar region: Secondary | ICD-10-CM | POA: Diagnosis not present

## 2022-04-12 DIAGNOSIS — M5412 Radiculopathy, cervical region: Secondary | ICD-10-CM | POA: Diagnosis not present

## 2022-04-17 DIAGNOSIS — R2681 Unsteadiness on feet: Secondary | ICD-10-CM | POA: Diagnosis not present

## 2022-04-17 DIAGNOSIS — M5416 Radiculopathy, lumbar region: Secondary | ICD-10-CM | POA: Diagnosis not present

## 2022-04-17 DIAGNOSIS — M792 Neuralgia and neuritis, unspecified: Secondary | ICD-10-CM | POA: Diagnosis not present

## 2022-04-17 DIAGNOSIS — M6281 Muscle weakness (generalized): Secondary | ICD-10-CM | POA: Diagnosis not present

## 2022-04-17 DIAGNOSIS — M5412 Radiculopathy, cervical region: Secondary | ICD-10-CM | POA: Diagnosis not present

## 2022-04-19 DIAGNOSIS — M5412 Radiculopathy, cervical region: Secondary | ICD-10-CM | POA: Diagnosis not present

## 2022-04-19 DIAGNOSIS — M6281 Muscle weakness (generalized): Secondary | ICD-10-CM | POA: Diagnosis not present

## 2022-04-19 DIAGNOSIS — M5416 Radiculopathy, lumbar region: Secondary | ICD-10-CM | POA: Diagnosis not present

## 2022-04-19 DIAGNOSIS — R2681 Unsteadiness on feet: Secondary | ICD-10-CM | POA: Diagnosis not present

## 2022-04-19 DIAGNOSIS — M792 Neuralgia and neuritis, unspecified: Secondary | ICD-10-CM | POA: Diagnosis not present

## 2022-04-24 DIAGNOSIS — R2681 Unsteadiness on feet: Secondary | ICD-10-CM | POA: Diagnosis not present

## 2022-04-24 DIAGNOSIS — M6281 Muscle weakness (generalized): Secondary | ICD-10-CM | POA: Diagnosis not present

## 2022-04-24 DIAGNOSIS — M5416 Radiculopathy, lumbar region: Secondary | ICD-10-CM | POA: Diagnosis not present

## 2022-04-24 DIAGNOSIS — M792 Neuralgia and neuritis, unspecified: Secondary | ICD-10-CM | POA: Diagnosis not present

## 2022-04-24 DIAGNOSIS — M5412 Radiculopathy, cervical region: Secondary | ICD-10-CM | POA: Diagnosis not present

## 2022-04-26 DIAGNOSIS — M6281 Muscle weakness (generalized): Secondary | ICD-10-CM | POA: Diagnosis not present

## 2022-04-26 DIAGNOSIS — R2681 Unsteadiness on feet: Secondary | ICD-10-CM | POA: Diagnosis not present

## 2022-04-26 DIAGNOSIS — M5412 Radiculopathy, cervical region: Secondary | ICD-10-CM | POA: Diagnosis not present

## 2022-04-26 DIAGNOSIS — M792 Neuralgia and neuritis, unspecified: Secondary | ICD-10-CM | POA: Diagnosis not present

## 2022-04-26 DIAGNOSIS — M5416 Radiculopathy, lumbar region: Secondary | ICD-10-CM | POA: Diagnosis not present

## 2022-05-01 DIAGNOSIS — M25561 Pain in right knee: Secondary | ICD-10-CM | POA: Diagnosis not present

## 2022-05-01 DIAGNOSIS — M4722 Other spondylosis with radiculopathy, cervical region: Secondary | ICD-10-CM | POA: Diagnosis not present

## 2022-05-01 DIAGNOSIS — M792 Neuralgia and neuritis, unspecified: Secondary | ICD-10-CM | POA: Diagnosis not present

## 2022-05-01 DIAGNOSIS — Z79891 Long term (current) use of opiate analgesic: Secondary | ICD-10-CM | POA: Diagnosis not present

## 2022-05-01 DIAGNOSIS — M542 Cervicalgia: Secondary | ICD-10-CM | POA: Diagnosis not present

## 2022-05-01 DIAGNOSIS — M5416 Radiculopathy, lumbar region: Secondary | ICD-10-CM | POA: Diagnosis not present

## 2022-05-01 DIAGNOSIS — M545 Low back pain, unspecified: Secondary | ICD-10-CM | POA: Diagnosis not present

## 2022-05-06 ENCOUNTER — Other Ambulatory Visit: Payer: Self-pay | Admitting: Neurology

## 2022-05-08 NOTE — Telephone Encounter (Signed)
Refill needed

## 2022-05-17 DIAGNOSIS — M5412 Radiculopathy, cervical region: Secondary | ICD-10-CM | POA: Diagnosis not present

## 2022-05-29 DIAGNOSIS — M4722 Other spondylosis with radiculopathy, cervical region: Secondary | ICD-10-CM | POA: Diagnosis not present

## 2022-05-29 DIAGNOSIS — M792 Neuralgia and neuritis, unspecified: Secondary | ICD-10-CM | POA: Diagnosis not present

## 2022-05-29 DIAGNOSIS — M545 Low back pain, unspecified: Secondary | ICD-10-CM | POA: Diagnosis not present

## 2022-05-29 DIAGNOSIS — M542 Cervicalgia: Secondary | ICD-10-CM | POA: Diagnosis not present

## 2022-06-19 ENCOUNTER — Other Ambulatory Visit: Payer: Self-pay | Admitting: Nurse Practitioner

## 2022-06-19 ENCOUNTER — Ambulatory Visit
Admission: RE | Admit: 2022-06-19 | Discharge: 2022-06-19 | Disposition: A | Discharge status: Home / Self Care | Payer: Medicare PPO | Source: Ambulatory Visit | Attending: Nurse Practitioner | Admitting: Nurse Practitioner

## 2022-06-19 DIAGNOSIS — M545 Low back pain, unspecified: Secondary | ICD-10-CM

## 2022-06-19 DIAGNOSIS — M4722 Other spondylosis with radiculopathy, cervical region: Secondary | ICD-10-CM | POA: Diagnosis not present

## 2022-06-19 DIAGNOSIS — M47817 Spondylosis without myelopathy or radiculopathy, lumbosacral region: Secondary | ICD-10-CM | POA: Diagnosis not present

## 2022-06-19 DIAGNOSIS — M791 Myalgia, unspecified site: Secondary | ICD-10-CM | POA: Diagnosis not present

## 2022-06-19 DIAGNOSIS — M48061 Spinal stenosis, lumbar region without neurogenic claudication: Secondary | ICD-10-CM | POA: Diagnosis not present

## 2022-06-19 DIAGNOSIS — M792 Neuralgia and neuritis, unspecified: Secondary | ICD-10-CM | POA: Diagnosis not present

## 2022-06-19 DIAGNOSIS — M47816 Spondylosis without myelopathy or radiculopathy, lumbar region: Secondary | ICD-10-CM | POA: Diagnosis not present

## 2022-06-19 DIAGNOSIS — M542 Cervicalgia: Secondary | ICD-10-CM | POA: Diagnosis not present

## 2022-06-19 DIAGNOSIS — M533 Sacrococcygeal disorders, not elsewhere classified: Secondary | ICD-10-CM | POA: Diagnosis not present

## 2022-06-25 DIAGNOSIS — M4302 Spondylolysis, cervical region: Secondary | ICD-10-CM | POA: Diagnosis not present

## 2022-06-25 DIAGNOSIS — M5412 Radiculopathy, cervical region: Secondary | ICD-10-CM | POA: Diagnosis not present

## 2022-06-25 DIAGNOSIS — M542 Cervicalgia: Secondary | ICD-10-CM | POA: Diagnosis not present

## 2022-06-26 DIAGNOSIS — G629 Polyneuropathy, unspecified: Secondary | ICD-10-CM | POA: Diagnosis not present

## 2022-06-28 DIAGNOSIS — I152 Hypertension secondary to endocrine disorders: Secondary | ICD-10-CM | POA: Diagnosis not present

## 2022-06-28 DIAGNOSIS — E1159 Type 2 diabetes mellitus with other circulatory complications: Secondary | ICD-10-CM | POA: Diagnosis not present

## 2022-06-28 DIAGNOSIS — I10 Essential (primary) hypertension: Secondary | ICD-10-CM | POA: Diagnosis not present

## 2022-06-28 DIAGNOSIS — Z6837 Body mass index (BMI) 37.0-37.9, adult: Secondary | ICD-10-CM | POA: Diagnosis not present

## 2022-06-28 DIAGNOSIS — Z299 Encounter for prophylactic measures, unspecified: Secondary | ICD-10-CM | POA: Diagnosis not present

## 2022-07-04 DIAGNOSIS — J09X2 Influenza due to identified novel influenza A virus with other respiratory manifestations: Secondary | ICD-10-CM | POA: Diagnosis not present

## 2022-07-04 DIAGNOSIS — Z792 Long term (current) use of antibiotics: Secondary | ICD-10-CM | POA: Diagnosis not present

## 2022-07-04 DIAGNOSIS — I110 Hypertensive heart disease with heart failure: Secondary | ICD-10-CM | POA: Diagnosis not present

## 2022-07-04 DIAGNOSIS — E782 Mixed hyperlipidemia: Secondary | ICD-10-CM | POA: Diagnosis not present

## 2022-07-04 DIAGNOSIS — I517 Cardiomegaly: Secondary | ICD-10-CM | POA: Diagnosis not present

## 2022-07-04 DIAGNOSIS — R4182 Altered mental status, unspecified: Secondary | ICD-10-CM | POA: Diagnosis not present

## 2022-07-04 DIAGNOSIS — R059 Cough, unspecified: Secondary | ICD-10-CM | POA: Diagnosis not present

## 2022-07-04 DIAGNOSIS — G4733 Obstructive sleep apnea (adult) (pediatric): Secondary | ICD-10-CM | POA: Diagnosis not present

## 2022-07-04 DIAGNOSIS — Z9989 Dependence on other enabling machines and devices: Secondary | ICD-10-CM | POA: Diagnosis not present

## 2022-07-04 DIAGNOSIS — J101 Influenza due to other identified influenza virus with other respiratory manifestations: Secondary | ICD-10-CM | POA: Diagnosis not present

## 2022-07-04 DIAGNOSIS — Z87891 Personal history of nicotine dependence: Secondary | ICD-10-CM | POA: Diagnosis not present

## 2022-07-04 DIAGNOSIS — Z7901 Long term (current) use of anticoagulants: Secondary | ICD-10-CM | POA: Diagnosis not present

## 2022-07-04 DIAGNOSIS — I340 Nonrheumatic mitral (valve) insufficiency: Secondary | ICD-10-CM | POA: Diagnosis not present

## 2022-07-04 DIAGNOSIS — W19XXXA Unspecified fall, initial encounter: Secondary | ICD-10-CM | POA: Diagnosis not present

## 2022-07-04 DIAGNOSIS — Z79899 Other long term (current) drug therapy: Secondary | ICD-10-CM | POA: Diagnosis not present

## 2022-07-04 DIAGNOSIS — Z9981 Dependence on supplemental oxygen: Secondary | ICD-10-CM | POA: Diagnosis not present

## 2022-07-04 DIAGNOSIS — N179 Acute kidney failure, unspecified: Secondary | ICD-10-CM | POA: Diagnosis not present

## 2022-07-04 DIAGNOSIS — R7402 Elevation of levels of lactic acid dehydrogenase (LDH): Secondary | ICD-10-CM | POA: Diagnosis not present

## 2022-07-04 DIAGNOSIS — I5020 Unspecified systolic (congestive) heart failure: Secondary | ICD-10-CM | POA: Diagnosis not present

## 2022-07-04 DIAGNOSIS — R0902 Hypoxemia: Secondary | ICD-10-CM | POA: Diagnosis not present

## 2022-07-04 DIAGNOSIS — R000 Tachycardia, unspecified: Secondary | ICD-10-CM | POA: Diagnosis not present

## 2022-07-04 DIAGNOSIS — I4891 Unspecified atrial fibrillation: Secondary | ICD-10-CM | POA: Diagnosis not present

## 2022-07-04 DIAGNOSIS — G4730 Sleep apnea, unspecified: Secondary | ICD-10-CM | POA: Diagnosis not present

## 2022-07-04 DIAGNOSIS — R7989 Other specified abnormal findings of blood chemistry: Secondary | ICD-10-CM | POA: Diagnosis not present

## 2022-07-04 DIAGNOSIS — I10 Essential (primary) hypertension: Secondary | ICD-10-CM | POA: Diagnosis not present

## 2022-07-04 DIAGNOSIS — J9601 Acute respiratory failure with hypoxia: Secondary | ICD-10-CM | POA: Diagnosis not present

## 2022-07-04 DIAGNOSIS — I509 Heart failure, unspecified: Secondary | ICD-10-CM | POA: Diagnosis not present

## 2022-07-04 DIAGNOSIS — I5021 Acute systolic (congestive) heart failure: Secondary | ICD-10-CM | POA: Diagnosis not present

## 2022-07-04 DIAGNOSIS — E119 Type 2 diabetes mellitus without complications: Secondary | ICD-10-CM | POA: Diagnosis not present

## 2022-07-16 DIAGNOSIS — D6869 Other thrombophilia: Secondary | ICD-10-CM | POA: Diagnosis not present

## 2022-07-16 DIAGNOSIS — Z299 Encounter for prophylactic measures, unspecified: Secondary | ICD-10-CM | POA: Diagnosis not present

## 2022-07-16 DIAGNOSIS — I10 Essential (primary) hypertension: Secondary | ICD-10-CM | POA: Diagnosis not present

## 2022-07-16 DIAGNOSIS — I4891 Unspecified atrial fibrillation: Secondary | ICD-10-CM | POA: Diagnosis not present

## 2022-07-16 DIAGNOSIS — I509 Heart failure, unspecified: Secondary | ICD-10-CM | POA: Diagnosis not present

## 2022-07-24 DIAGNOSIS — M545 Low back pain, unspecified: Secondary | ICD-10-CM | POA: Diagnosis not present

## 2022-07-24 DIAGNOSIS — G894 Chronic pain syndrome: Secondary | ICD-10-CM | POA: Diagnosis not present

## 2022-07-24 DIAGNOSIS — M792 Neuralgia and neuritis, unspecified: Secondary | ICD-10-CM | POA: Diagnosis not present

## 2022-07-24 DIAGNOSIS — M791 Myalgia, unspecified site: Secondary | ICD-10-CM | POA: Diagnosis not present

## 2022-07-24 DIAGNOSIS — M47816 Spondylosis without myelopathy or radiculopathy, lumbar region: Secondary | ICD-10-CM | POA: Diagnosis not present

## 2022-07-26 DIAGNOSIS — M4302 Spondylolysis, cervical region: Secondary | ICD-10-CM | POA: Diagnosis not present

## 2022-07-26 DIAGNOSIS — M542 Cervicalgia: Secondary | ICD-10-CM | POA: Diagnosis not present

## 2022-07-26 DIAGNOSIS — M5412 Radiculopathy, cervical region: Secondary | ICD-10-CM | POA: Diagnosis not present

## 2022-07-31 DIAGNOSIS — I4819 Other persistent atrial fibrillation: Secondary | ICD-10-CM | POA: Diagnosis not present

## 2022-07-31 DIAGNOSIS — I5022 Chronic systolic (congestive) heart failure: Secondary | ICD-10-CM | POA: Diagnosis not present

## 2022-08-09 ENCOUNTER — Ambulatory Visit: Payer: Medicare PPO | Admitting: Neurology

## 2022-08-10 DIAGNOSIS — R0602 Shortness of breath: Secondary | ICD-10-CM | POA: Diagnosis not present

## 2022-08-10 DIAGNOSIS — K3189 Other diseases of stomach and duodenum: Secondary | ICD-10-CM | POA: Diagnosis not present

## 2022-08-10 DIAGNOSIS — K2900 Acute gastritis without bleeding: Secondary | ICD-10-CM | POA: Diagnosis not present

## 2022-08-10 DIAGNOSIS — M1990 Unspecified osteoarthritis, unspecified site: Secondary | ICD-10-CM | POA: Diagnosis not present

## 2022-08-10 DIAGNOSIS — R0989 Other specified symptoms and signs involving the circulatory and respiratory systems: Secondary | ICD-10-CM | POA: Diagnosis not present

## 2022-08-10 DIAGNOSIS — R1011 Right upper quadrant pain: Secondary | ICD-10-CM | POA: Diagnosis not present

## 2022-08-10 DIAGNOSIS — I10 Essential (primary) hypertension: Secondary | ICD-10-CM | POA: Diagnosis not present

## 2022-08-10 DIAGNOSIS — Z66 Do not resuscitate: Secondary | ICD-10-CM | POA: Diagnosis not present

## 2022-08-10 DIAGNOSIS — Z87891 Personal history of nicotine dependence: Secondary | ICD-10-CM | POA: Diagnosis not present

## 2022-08-15 DIAGNOSIS — M5412 Radiculopathy, cervical region: Secondary | ICD-10-CM | POA: Diagnosis not present

## 2022-08-15 DIAGNOSIS — M4302 Spondylolysis, cervical region: Secondary | ICD-10-CM | POA: Diagnosis not present

## 2022-08-15 DIAGNOSIS — M542 Cervicalgia: Secondary | ICD-10-CM | POA: Diagnosis not present

## 2022-08-21 DIAGNOSIS — M47816 Spondylosis without myelopathy or radiculopathy, lumbar region: Secondary | ICD-10-CM | POA: Diagnosis not present

## 2022-08-21 DIAGNOSIS — M791 Myalgia, unspecified site: Secondary | ICD-10-CM | POA: Diagnosis not present

## 2022-08-21 DIAGNOSIS — G894 Chronic pain syndrome: Secondary | ICD-10-CM | POA: Diagnosis not present

## 2022-08-21 DIAGNOSIS — M792 Neuralgia and neuritis, unspecified: Secondary | ICD-10-CM | POA: Diagnosis not present

## 2022-08-21 DIAGNOSIS — M545 Low back pain, unspecified: Secondary | ICD-10-CM | POA: Diagnosis not present

## 2022-08-25 DIAGNOSIS — M4302 Spondylolysis, cervical region: Secondary | ICD-10-CM | POA: Diagnosis not present

## 2022-08-25 DIAGNOSIS — M5412 Radiculopathy, cervical region: Secondary | ICD-10-CM | POA: Diagnosis not present

## 2022-08-25 DIAGNOSIS — M542 Cervicalgia: Secondary | ICD-10-CM | POA: Diagnosis not present

## 2022-08-26 IMAGING — MR MR HEAD W/O CM
11 series · 48 of 48 positions shown · non-contrast
Comparison: 04/21/2017 and prior.

CLINICAL DATA: Imbalance and falls.  Weakness and numbness.

EXAM:
MRI HEAD WITHOUT CONTRAST
TECHNIQUE: Multiplanar, multiecho pulse sequences of the brain and surrounding
structures were obtained without intravenous contrast.

[Series 5: T1 · sagittal · 4.0mm · 1.02mm/px · 2 of 31 slices shown (1 of 2)]
[im 1/31]
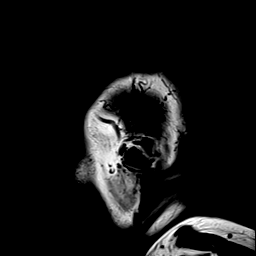
[im 31/31]
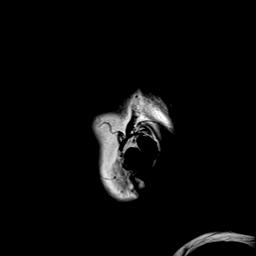

[Series 6: DWI · axial · 3.0mm · 0.98mm/px · z∈[-52,+83]mm · 11 of 170 slices shown (1 of 3)]
[im 1/170]
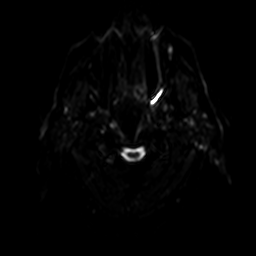
[im 17/170]
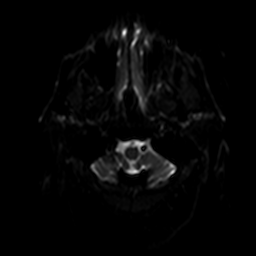
[im 34/170]
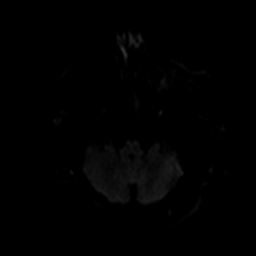
[im 51/170]
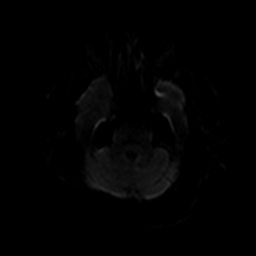
[im 68/170]
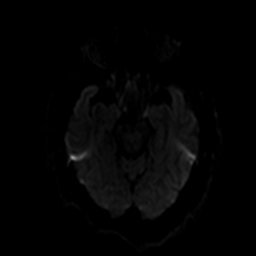
[im 85/170]
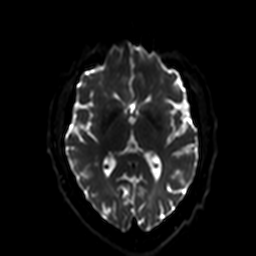
[im 102/170]
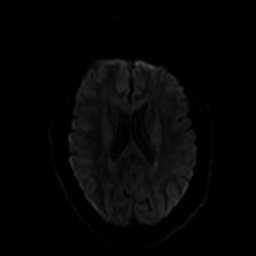
[im 119/170]
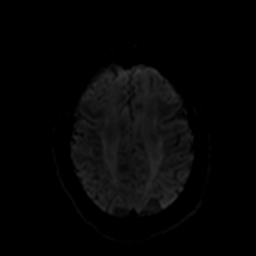
[im 136/170]
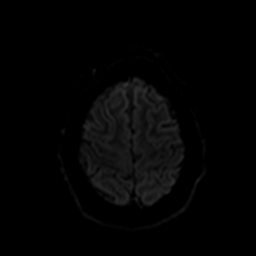
[im 153/170]
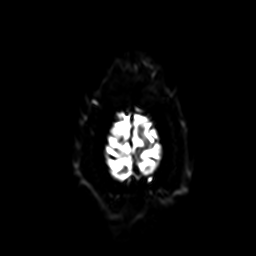
[im 170/170]
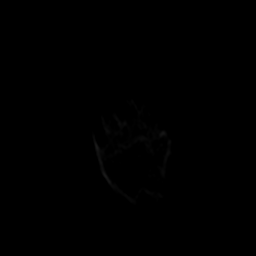

[Series 7: ax dwi_tracew · axial · 3.0mm · 0.98mm/px · z∈[-52,+83]mm · 5 of 84 slices shown]
[im 1/84]
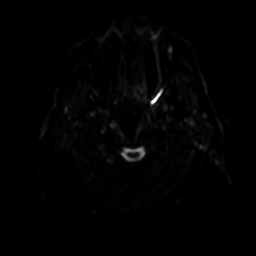
[im 21/84]
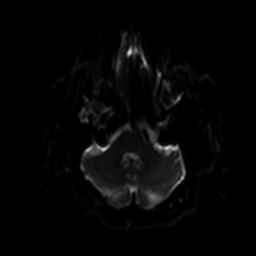
[im 42/84]
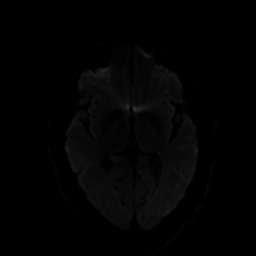
[im 63/84]
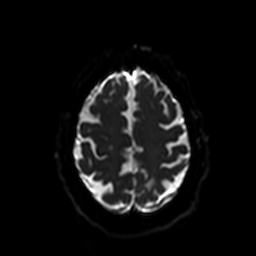
[im 84/84]
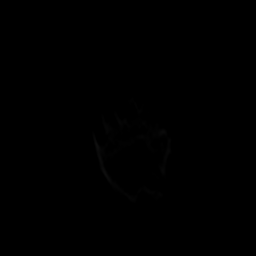

[Series 8: ax dwi_adc · axial · 3.0mm · 0.98mm/px · z∈[-52,+83]mm · 3 of 43 slices shown]
[im 1/43]
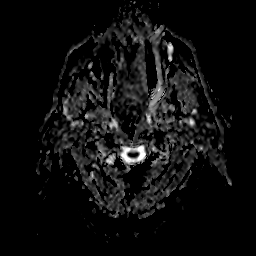
[im 22/43]
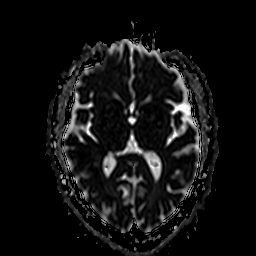
[im 43/43]
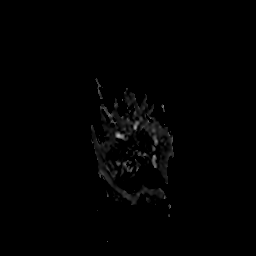

[Series 9: DWI · coronal · 5.0mm · 1.44mm/px · 4 of 66 slices shown (2 of 3)]
[im 1/66]
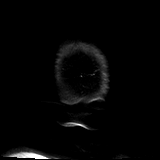
[im 22/66]
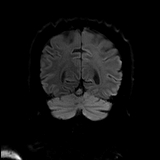
[im 44/66]
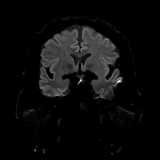
[im 66/66]
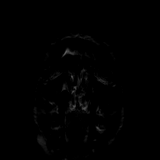

[Series 10: DWI · coronal · 5.0mm · 1.44mm/px · 2 of 33 slices shown (3 of 3)]
[im 1/33]
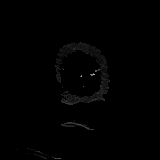
[im 33/33]
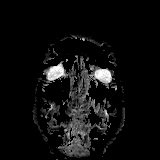

[Series 11: T2 · axial · 4.0mm · 0.45mm/px · z∈[-42,+84]mm · 2 of 29 slices shown (1 of 2)]
[im 1/29]
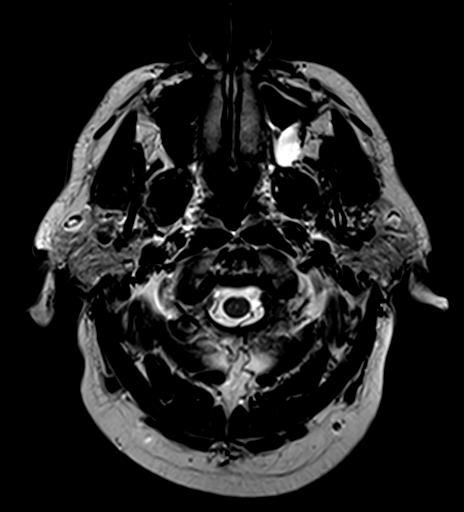
[im 29/29]
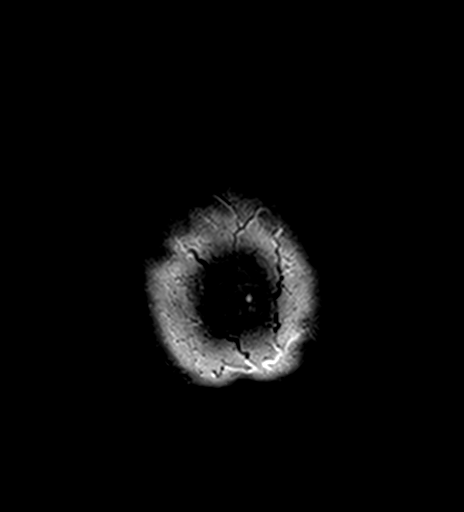

[Series 12: FLAIR · axial · 3.0mm · 0.90mm/px · z∈[-45,+85]mm · 2 of 26 slices shown]
[im 1/26]
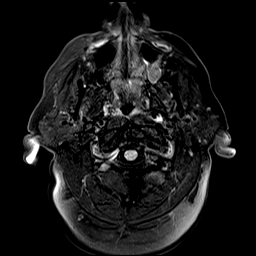
[im 26/26]
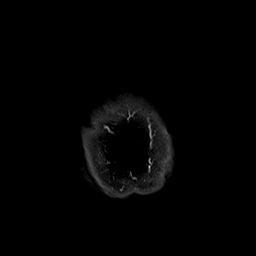

[Series 14: swi_images · axial · 1.5mm · 0.90mm/px · z∈[-40,+83]mm · 6 of 96 slices shown]
[im 1/96]
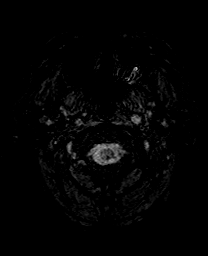
[im 20/96]
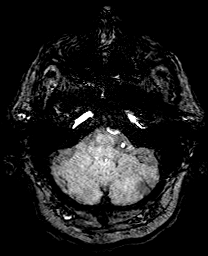
[im 39/96]
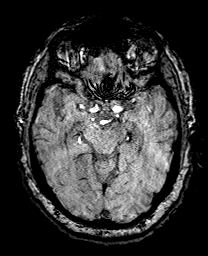
[im 58/96]
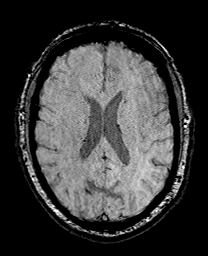
[im 77/96]
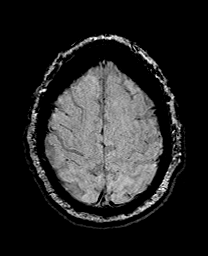
[im 96/96]
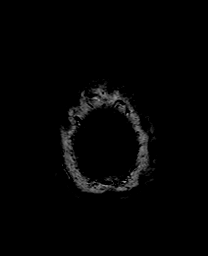

[Series 15: T1 · axial · 1.0mm · 0.94mm/px · z∈[-40,+85]mm · 9 of 144 slices shown (2 of 2)]
[im 1/144]
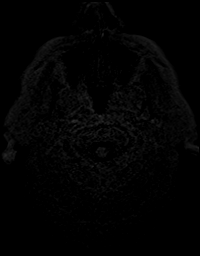
[im 18/144]
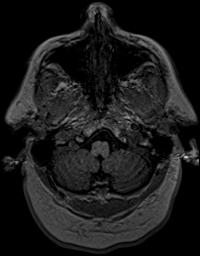
[im 36/144]
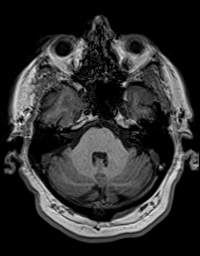
[im 54/144]
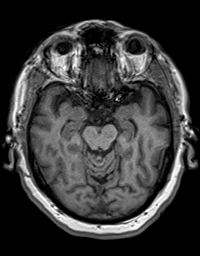
[im 72/144]
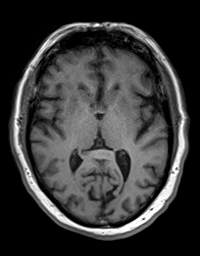
[im 90/144]
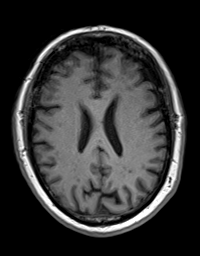
[im 108/144]
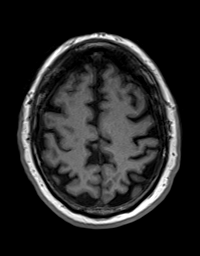
[im 126/144]
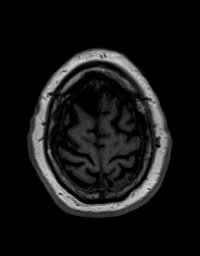
[im 144/144]
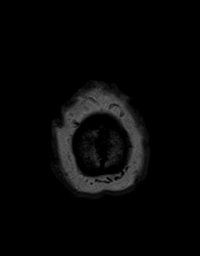

[Series 16: T2 · coronal · 4.5mm · 0.36mm/px · 2 of 31 slices shown (2 of 2)]
[im 1/31]
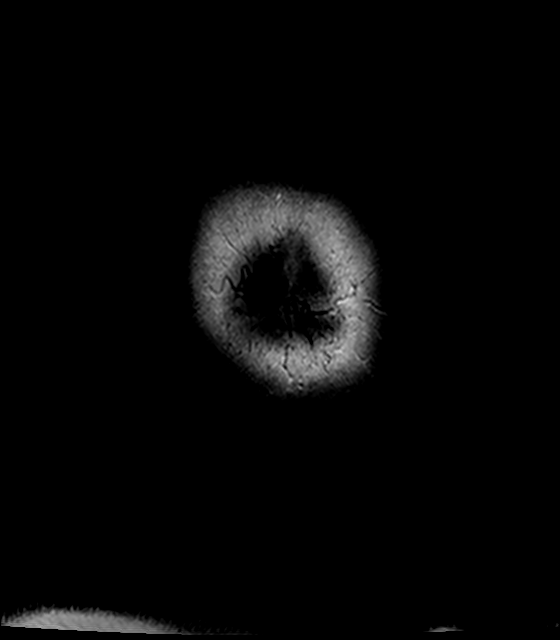
[im 31/31]
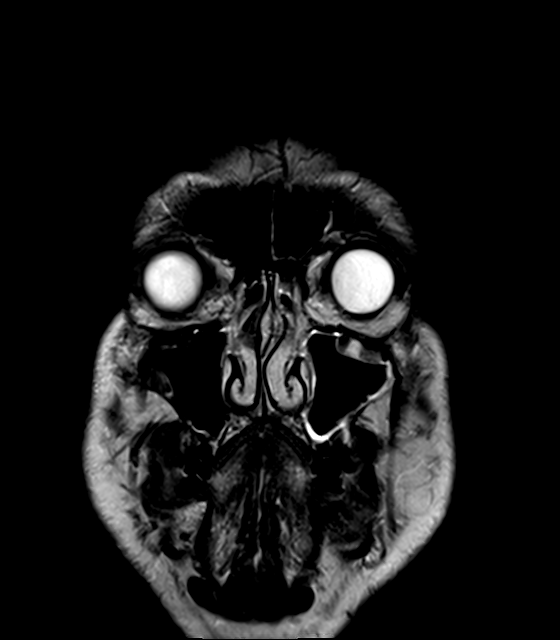

[48 of 48 positions shown; findings below may reference images not displayed]

FINDINGS: Brain: No diffusion-weighted signal abnormality. No intracranial
hemorrhage. No midline shift, ventriculomegaly or extra-axial fluid
collection. No mass lesion. Chronic left parietal insult at the
vertex. Mild chronic microvascular ischemic changes.

Vascular: Normal flow voids.

Skull and upper cervical spine: Normal marrow signal.

Sinuses/Orbits: Sequela of bilateral lens replacement. Mild pansinus
mucosal thickening.

Other: None.
IMPRESSION: No acute intracranial process.  Chronic left parietal insult.

Mild chronic microvascular ischemic changes.

## 2022-08-28 ENCOUNTER — Ambulatory Visit: Payer: Medicare PPO | Attending: Cardiovascular Disease | Admitting: Cardiovascular Disease

## 2022-08-28 VITALS — BP 90/60 | HR 68 | Ht 70.0 in | Wt 261.4 lb

## 2022-08-28 DIAGNOSIS — I4819 Other persistent atrial fibrillation: Secondary | ICD-10-CM | POA: Diagnosis not present

## 2022-08-28 NOTE — H&P (View-Only) (Signed)
Electrophysiology Office Note:    Date:  08/28/2022   ID:  Jason [name], DOB 10/19/1945, MRN [id number]  PCP:  Vyas, Dhruv B, MD   [facility] Providers Cardiologist:  None Electrophysiologist:  Soham Hollett E Deangleo Passage, MD     Referring MD: Assar, Soheil, DO   History of Present Illness:    Jason [name] is a 76 y.o. male with a hx listed below, significant for CHFrEF, HTN, DM referred for arrhythmia management.  He has an Apple Watch that began to give him notifications of atrial fibrillation in about 2021 or 2022.  These episodes would come and go and were not particularly symptomatic for him when paroxysmal.  He was hospitalized for acute heart failure few months ago and at that time noted to be in persistent atrial fibrillation.  He does not have terrible palpitations but is significant more fatigued and short of breath now that he is persistently in atrial fibrillation.  Past Medical History:  Diagnosis Date   Diabetes mellitus without complication (HCC)    GERD (gastroesophageal reflux disease)    History of anemia    Childhood   History of cataract    History of kidney stones    History of migraine    resolved   Hypertension    states under control with med., has been on med. x 5 yr.   Insomnia    Low blood pressure    x3 hospitalized once for it   Memory difficulty 05/09/2018   MRSA (methicillin resistant staph aureus) culture positive 09/03/2018   Paresthesia 04/27/2015   Peripheral neuropathy 11/06/2017   RLS (restless legs syndrome) 04/27/2015   Seasonal allergies    Sleep apnea    no CPAP use, REPORTS HE NEEDS A NEW ONE    Staph aureus infection 09/03/2018   TIA (transient ischemic attack)    Unilateral osteoarthritis of knee 12/2014   left    Past Surgical History:  Procedure Laterality Date   CATARACT EXTRACTION, BILATERAL     COLONOSCOPY     CYSTOSCOPY WITH URETEROSCOPY AND STENT PLACEMENT Bilateral 09/08/2018   Procedure: CYSTOSCOPY WITH BILATERAL   URETEROSCOPY HOLMIUM LASER AND STENT PLACEMENT;  Surgeon: Bell, Eugene D III, MD;  Location: [facility];  Service: Urology;  Laterality: Bilateral;   LAPAROSCOPIC NEPHRECTOMY, HAND ASSISTED Right 10/15/2018   Procedure: HAND ASSISTED LAPAROSCOPIC NEPHRECTOMY;  Surgeon: Bell, Eugene D III, MD;  Location: [facility];  Service: Urology;  Laterality: Right;   PARTIAL KNEE ARTHROPLASTY Right 06/02/2013   Procedure: RIGHT UNICOMPARTMENTAL KNEE;  Surgeon: Joshua P Landau, MD;  Location: [facility];  Service: Orthopedics;  Laterality: Right;   PARTIAL KNEE ARTHROPLASTY Left 12/17/2014   Procedure: LEFT KNEE UNI ARTHROPLASTY ;  Surgeon: Joshua Landau, MD;  Location: [facility];  Service: Orthopedics;  Laterality: Left;   TONSILLECTOMY  1950's   UPPER GI ENDOSCOPY      Current Medications: Current Meds  Medication Sig   amiodarone (PACERONE) 200 MG tablet Take 1 tablet by mouth daily.   apixaban (ELIQUIS) 5 MG TABS tablet Take 5 mg by mouth 2 (two) times daily.   [employer] FIBER GUMMIES PO Take 2 tablets by mouth daily.   DULoxetine (CYMBALTA) 60 MG capsule Take 1 capsule (60 mg total) by mouth 2 (two) times daily.   lisinopril (ZESTRIL) 2.5 MG tablet    metoprolol tartrate (LOPRESSOR) 25 MG tablet Take 25 mg by mouth every evening.    Multiple Vitamins-Minerals (ADULT GUMMY PO) Take 2   tablets by mouth daily.   pantoprazole (PROTONIX) 40 MG tablet Take 40 mg by mouth daily.   Polyethyl Glycol-Propyl Glycol 0.4-0.3 % SOLN Place 1 drop into both eyes 3 (three) times daily as needed (dry eyes).    pregabalin (LYRICA) 200 MG capsule TAKE 1 CAPSULE BY MOUTH THREE TIMES DAILY   promethazine (PHENERGAN) 25 MG tablet Take 12.5 mg by mouth every 6 (six) hours as needed for nausea.   rOPINIRole (REQUIP) 2 MG tablet TAKE 2 TABLETS BY MOUTH EVERY DAY AS NEEDED FOR BREAKTHROUGH PAIN   rosuvastatin (CRESTOR) 10 MG tablet Take 10 mg by mouth daily.   rotigotine (NEUPRO) 4 MG/24HR place ONE PATCH onto THE SKIN DAILY    tamsulosin (FLOMAX) 0.4 MG CAPS capsule Take 0.4 mg by mouth daily.   traMADol (ULTRAM) 50 MG tablet Take 1 tablet (50 mg total) by mouth every 6 (six) hours as needed.   [DISCONTINUED] aspirin EC 81 MG tablet Take 81 mg by mouth every evening.   [DISCONTINUED] oxybutynin (DITROPAN) 5 MG tablet Take 1 tablet (5 mg total) by mouth 3 (three) times daily.     Allergies:   Augmentin [amoxicillin-pot clavulanate], Penicillins, and Clavulanic acid   Social and Family History: Reviewed in Epic  ROS:   Please see the history of present illness.    All other systems reviewed and are negative.  EKGs/Labs/Other Studies Reviewed Today:    Echocardiogram:  TTE -- from referring 07/2022 Technically difficultm EF 40-45%, moderate MR, LA is moderately dilated    Monitors:   Stress testing:   Advanced imaging:   Cardiac catherization   EKG:  Last EKG results: today AF, rate 68 bpm   Recent Labs: No results found for requested labs within last 365 days.     Physical Exam:    VS:  BP 90/60   Pulse 68   Ht 5' 10" (1.778 m)   Wt 261 lb 6.4 oz (118.6 kg)   SpO2 97%   BMI 37.51 kg/m     Wt Readings from Last 3 Encounters:  08/28/22 261 lb 6.4 oz (118.6 kg)  01/31/22 254 lb (115.2 kg)  09/21/21 250 lb 8 oz (113.6 kg)     GEN: Well nourished, well developed in no acute distress CARDIAC: iRRR, no murmurs, rubs, gallops RESPIRATORY:  Normal work of breathing MUSCULOSKELETAL: no edema    ASSESSMENT & PLAN:    Persistent atrial fibrillation Symptomatic with fatigue, shortness of breath, heart failure symptoms We will plan for DC cardioversion initially Continue amiodarone for now Will follow-up after cardioversion to discuss alternative to amiodarone --either Tikosyn or ablation Continue apixaban for secondary hypercoagulable state Continue metoprolol 25 mg twice daily for rate control  Obstructive sleep apnea He reports that he is using CPAP regularly.  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  Concerns regarding medicines are outlined above.  No orders of the defined types were placed in this encounter.  No orders of the defined types were placed in this encounter.    Signed, Aliayah Tyer E Lakeysha Slutsky, MD  08/28/2022 9:31 AM    [facility] 

## 2022-08-28 NOTE — Patient Instructions (Addendum)
Medication Instructions:  Your physician recommends that you continue on your current medications as directed. Please refer to the Current Medication list given to you today. *If you need a refill on your cardiac medications before your next appointment, please call your pharmacy*   Testing/Procedures: Cardioversion Your physician has recommended that you have a Cardioversion (DCCV). Electrical Cardioversion uses a jolt of electricity to your heart either through paddles or wired patches attached to your chest. This is a controlled, usually prescheduled, procedure. Defibrillation is done under light anesthesia in the hospital, and you usually go home the day of the procedure. This is done to get your heart back into a normal rhythm. You are not awake for the procedure. Please see the instruction sheet given to you today.    Follow-Up: At [facility], you and your health needs are our priority.  As part of our continuing mission to provide you with exceptional heart care, we have created designated Provider Care Teams.  These Care Teams include your primary Cardiologist (physician) and Advanced Practice Providers (APPs -  Physician Assistants and Nurse Practitioners) who all work together to provide you with the care you need, when you need it.  We recommend signing up for the patient portal called "MyChart".  Sign up information is provided on this After Visit Summary.  MyChart is used to connect with patients for Virtual Visits (Telemedicine).  Patients are able to view lab/test results, encounter notes, upcoming appointments, etc.  Non-urgent messages can be sent to your provider as well.   To learn more about what you can do with MyChart, go to [email or web address].    Your next appointment:   2 -4 week(s) (after cardioversion)  Provider:   York Pellant, MD

## 2022-08-28 NOTE — Progress Notes (Signed)
Electrophysiology Office Note:    Date:  08/28/2022   ID:  Jason [name], Jason [name] [birth year], MRN [id number]  PCP:  Ignatius Specking, MD   [facility] Providers Cardiologist:  None Electrophysiologist:  Maurice Small, MD     Referring MD: Hubert Azure, DO   History of Present Illness:    Jason [name] is a 77 y.o. male with a hx listed below, significant for CHFrEF, HTN, DM referred for arrhythmia management.  He has an Apple Watch that began to give him notifications of atrial fibrillation in about 2021 or 2022.  These episodes would come and go and were not particularly symptomatic for him when paroxysmal.  He was hospitalized for acute heart failure few months ago and at that time noted to be in persistent atrial fibrillation.  He does not have terrible palpitations but is significant more fatigued and short of breath now that he is persistently in atrial fibrillation.  Past Medical History:  Diagnosis Date   Diabetes mellitus without complication (HCC)    GERD (gastroesophageal reflux disease)    History of anemia    Childhood   History of cataract    History of kidney stones    History of migraine    resolved   Hypertension    states under control with med., has been on med. x 5 yr.   Insomnia    Low blood pressure    x3 hospitalized once for it   Memory difficulty 05/09/2018   MRSA (methicillin resistant staph aureus) culture positive 09/03/2018   Paresthesia 04/27/2015   Peripheral neuropathy 11/06/2017   RLS (restless legs syndrome) 04/27/2015   Seasonal allergies    Sleep apnea    no CPAP use, REPORTS HE NEEDS A NEW ONE    Staph aureus infection 09/03/2018   TIA (transient ischemic attack)    Unilateral osteoarthritis of knee 12/2014   left    Past Surgical History:  Procedure Laterality Date   CATARACT EXTRACTION, BILATERAL     COLONOSCOPY     CYSTOSCOPY WITH URETEROSCOPY AND STENT PLACEMENT Bilateral 09/08/2018   Procedure: CYSTOSCOPY WITH BILATERAL   URETEROSCOPY HOLMIUM LASER AND STENT PLACEMENT;  Surgeon: Crista Elliot, MD;  Location: [facility];  Service: Urology;  Laterality: Bilateral;   LAPAROSCOPIC NEPHRECTOMY, HAND ASSISTED Right 10/15/2018   Procedure: HAND ASSISTED LAPAROSCOPIC NEPHRECTOMY;  Surgeon: Crista Elliot, MD;  Location: [facility];  Service: Urology;  Laterality: Right;   PARTIAL KNEE ARTHROPLASTY Right 06/02/2013   Procedure: RIGHT UNICOMPARTMENTAL KNEE;  Surgeon: Eulas Post, MD;  Location: [facility];  Service: Orthopedics;  Laterality: Right;   PARTIAL KNEE ARTHROPLASTY Left 12/17/2014   Procedure: LEFT KNEE UNI ARTHROPLASTY ;  Surgeon: Teryl Lucy, MD;  Location: [facility];  Service: Orthopedics;  Laterality: Left;   TONSILLECTOMY  1950's   UPPER GI ENDOSCOPY      Current Medications: Current Meds  Medication Sig   amiodarone (PACERONE) 200 MG tablet Take 1 tablet by mouth daily.   apixaban (ELIQUIS) 5 MG TABS tablet Take 5 mg by mouth 2 (two) times daily.   [employer] FIBER GUMMIES PO Take 2 tablets by mouth daily.   DULoxetine (CYMBALTA) 60 MG capsule Take 1 capsule (60 mg total) by mouth 2 (two) times daily.   lisinopril (ZESTRIL) 2.5 MG tablet    metoprolol tartrate (LOPRESSOR) 25 MG tablet Take 25 mg by mouth every evening.    Multiple Vitamins-Minerals (ADULT GUMMY PO) Take 2  tablets by mouth daily.   pantoprazole (PROTONIX) 40 MG tablet Take 40 mg by mouth daily.   Polyethyl Glycol-Propyl Glycol 0.4-0.3 % SOLN Place 1 drop into both eyes 3 (three) times daily as needed (dry eyes).    pregabalin (LYRICA) 200 MG capsule TAKE 1 CAPSULE BY MOUTH THREE TIMES DAILY   promethazine (PHENERGAN) 25 MG tablet Take 12.5 mg by mouth every 6 (six) hours as needed for nausea.   rOPINIRole (REQUIP) 2 MG tablet TAKE 2 TABLETS BY MOUTH EVERY DAY AS NEEDED FOR BREAKTHROUGH PAIN   rosuvastatin (CRESTOR) 10 MG tablet Take 10 mg by mouth daily.   rotigotine (NEUPRO) 4 MG/24HR place ONE PATCH onto THE SKIN DAILY    tamsulosin (FLOMAX) 0.4 MG CAPS capsule Take 0.4 mg by mouth daily.   traMADol (ULTRAM) 50 MG tablet Take 1 tablet (50 mg total) by mouth every 6 (six) hours as needed.   [DISCONTINUED] aspirin EC 81 MG tablet Take 81 mg by mouth every evening.   [DISCONTINUED] oxybutynin (DITROPAN) 5 MG tablet Take 1 tablet (5 mg total) by mouth 3 (three) times daily.     Allergies:   Augmentin [amoxicillin-pot clavulanate], Penicillins, and Clavulanic acid   Social and Family History: Reviewed in Epic  ROS:   Please see the history of present illness.    All other systems reviewed and are negative.  EKGs/Labs/Other Studies Reviewed Today:    Echocardiogram:  TTE -- from referring 07/2022 Technically difficultm EF 40-45%, moderate MR, LA is moderately dilated    Monitors:   Stress testing:   Advanced imaging:   Cardiac catherization   EKG:  Last EKG results: today AF, rate 68 bpm   Recent Labs: No results found for requested labs within last 365 days.     Physical Exam:    VS:  BP 90/60   Pulse 68   Ht 5\' 10"  (1.778 m)   Wt 261 lb 6.4 oz (118.6 kg)   SpO2 97%   BMI 37.51 kg/m     Wt Readings from Last 3 Encounters:  08/28/22 261 lb 6.4 oz (118.6 kg)  01/31/22 254 lb (115.2 kg)  09/21/21 250 lb 8 oz (113.6 kg)     GEN: Well nourished, well developed in no acute distress CARDIAC: iRRR, no murmurs, rubs, gallops RESPIRATORY:  Normal work of breathing MUSCULOSKELETAL: no edema    ASSESSMENT & PLAN:    Persistent atrial fibrillation Symptomatic with fatigue, shortness of breath, heart failure symptoms We will plan for DC cardioversion initially Continue amiodarone for now Will follow-up after cardioversion to discuss alternative to amiodarone --either Tikosyn or ablation Continue apixaban for secondary hypercoagulable state Continue metoprolol 25 mg twice daily for rate control  Obstructive sleep apnea He reports that he is using CPAP regularly.  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  Concerns regarding medicines are outlined above.  No orders of the defined types were placed in this encounter.  No orders of the defined types were placed in this encounter.    Signed, Maurice Small, MD  08/28/2022 9:31 AM    [facility]

## 2022-08-30 NOTE — Anesthesia Preprocedure Evaluation (Addendum)
Anesthesia Evaluation  Patient identified by MRN, date of birth, ID band Patient awake    Reviewed: Allergy & Precautions, NPO status , Patient's Chart, lab work & pertinent test results, reviewed documented beta blocker date and time   Airway Mallampati: III  TM Distance: >3 FB Neck ROM: Full    Dental  (+) Dental Advisory Given, Missing, Chipped,    Pulmonary sleep apnea , former smoker   Pulmonary exam normal breath sounds clear to auscultation       Cardiovascular hypertension, Pt. on home beta blockers and Pt. on medications Normal cardiovascular exam+ dysrhythmias (eliquis, no missed doses) Atrial Fibrillation  Rhythm:Irregular Rate:Normal     Neuro/Psych TIA negative psych ROS   GI/Hepatic Neg liver ROS,GERD  ,,  Endo/Other  diabetes, Type 2    Renal/GU negative Renal ROS  negative genitourinary   Musculoskeletal  (+) Arthritis ,    Abdominal   Peds  Hematology negative hematology ROS (+)   Anesthesia Other Findings   Reproductive/Obstetrics                             Anesthesia Physical Anesthesia Plan  ASA: 3  Anesthesia Plan: General   Post-op Pain Management:    Induction: Intravenous  PONV Risk Score and Plan: Propofol infusion and Treatment may vary due to age or medical condition  Airway Management Planned: Natural Airway  Additional Equipment:   Intra-op Plan:   Post-operative Plan:   Informed Consent: I have reviewed the patients History and Physical, chart, labs and discussed the procedure including the risks, benefits and alternatives for the proposed anesthesia with the patient or authorized representative who has indicated his/her understanding and acceptance.     Dental advisory given  Plan Discussed with: CRNA  Anesthesia Plan Comments:        Anesthesia Quick Evaluation

## 2022-08-30 NOTE — Pre-Procedure Instructions (Signed)
Tried to call patient regarding pre-procedure instructions for cardioversion but number is not in service [phone])

## 2022-08-31 ENCOUNTER — Encounter (HOSPITAL_COMMUNITY)
Admission: RE | Disposition: A | Discharge status: Home / Self Care | Payer: Self-pay | Source: Home / Self Care | Attending: Cardiology

## 2022-08-31 ENCOUNTER — Other Ambulatory Visit: Payer: Self-pay

## 2022-08-31 ENCOUNTER — Ambulatory Visit (HOSPITAL_COMMUNITY): Payer: Medicare PPO | Admitting: Anesthesiology

## 2022-08-31 ENCOUNTER — Encounter (HOSPITAL_COMMUNITY): Payer: Self-pay | Admitting: Cardiology

## 2022-08-31 ENCOUNTER — Ambulatory Visit (HOSPITAL_COMMUNITY)
Admission: RE | Admit: 2022-08-31 | Discharge: 2022-08-31 | Disposition: A | Discharge status: Home / Self Care | Payer: Medicare PPO | Attending: Cardiology | Admitting: Cardiology

## 2022-08-31 DIAGNOSIS — Z79899 Other long term (current) drug therapy: Secondary | ICD-10-CM | POA: Insufficient documentation

## 2022-08-31 DIAGNOSIS — I10 Essential (primary) hypertension: Secondary | ICD-10-CM

## 2022-08-31 DIAGNOSIS — E119 Type 2 diabetes mellitus without complications: Secondary | ICD-10-CM | POA: Diagnosis not present

## 2022-08-31 DIAGNOSIS — G4733 Obstructive sleep apnea (adult) (pediatric): Secondary | ICD-10-CM | POA: Insufficient documentation

## 2022-08-31 DIAGNOSIS — I110 Hypertensive heart disease with heart failure: Secondary | ICD-10-CM | POA: Diagnosis not present

## 2022-08-31 DIAGNOSIS — Z87891 Personal history of nicotine dependence: Secondary | ICD-10-CM | POA: Diagnosis not present

## 2022-08-31 DIAGNOSIS — Z7901 Long term (current) use of anticoagulants: Secondary | ICD-10-CM | POA: Insufficient documentation

## 2022-08-31 DIAGNOSIS — I4819 Other persistent atrial fibrillation: Secondary | ICD-10-CM | POA: Insufficient documentation

## 2022-08-31 DIAGNOSIS — Z6837 Body mass index (BMI) 37.0-37.9, adult: Secondary | ICD-10-CM | POA: Diagnosis not present

## 2022-08-31 DIAGNOSIS — I4891 Unspecified atrial fibrillation: Secondary | ICD-10-CM

## 2022-08-31 DIAGNOSIS — I5022 Chronic systolic (congestive) heart failure: Secondary | ICD-10-CM | POA: Insufficient documentation

## 2022-08-31 DIAGNOSIS — E669 Obesity, unspecified: Secondary | ICD-10-CM | POA: Insufficient documentation

## 2022-08-31 DIAGNOSIS — D6869 Other thrombophilia: Secondary | ICD-10-CM | POA: Insufficient documentation

## 2022-08-31 HISTORY — PX: CARDIOVERSION: SHX1299

## 2022-08-31 LAB — GLUCOSE, CAPILLARY: Glucose-Capillary: 143 mg/dL — ABNORMAL HIGH (ref 70–99)

## 2022-08-31 SURGERY — CARDIOVERSION
Anesthesia: General

## 2022-08-31 MED ORDER — LIDOCAINE 2% (20 MG/ML) 5 ML SYRINGE
INTRAMUSCULAR | Status: DC | PRN
  Administered 2022-08-31: 60 mg via INTRAVENOUS

## 2022-08-31 MED ORDER — SODIUM CHLORIDE 0.9 % IV SOLN: INTRAVENOUS | Status: DC

## 2022-08-31 MED ORDER — PROPOFOL 10 MG/ML IV BOLUS
INTRAVENOUS | Status: DC | PRN
  Administered 2022-08-31: 10 mg via INTRAVENOUS
  Administered 2022-08-31: 60 mg via INTRAVENOUS

## 2022-08-31 SURGICAL SUPPLY — 1 items: ELECT DEFIB PAD ADLT CADENCE (PAD) ×2 IMPLANT

## 2022-08-31 NOTE — Transfer of Care (Signed)
Immediate Anesthesia Transfer of Care Note  Patient: Jason [name]  Procedure(s) Performed: CARDIOVERSION  Patient Location: Cath Lab  Anesthesia Type:General  Level of Consciousness: drowsy and patient cooperative  Airway & Oxygen Therapy: Patient Spontanous Breathing and Patient connected to nasal cannula oxygen  Post-op Assessment: Report given to RN, Post -op Vital signs reviewed and stable, and Patient moving all extremities X 4  Post vital signs: Reviewed and stable  Last Vitals:  Vitals Value Taken Time  BP 121/85 08/31/22 0835  Temp    Pulse 70 08/31/22 0836  Resp 19 08/31/22 0836  SpO2 100 % 08/31/22 0836    Last Pain:  Vitals:   08/31/22 0710  TempSrc:   PainSc: 0-No pain         Complications: No notable events documented.

## 2022-08-31 NOTE — OR Nursing (Signed)
Patient discharged from facility and instructed to take his AM dose of Eliquis- Discussed with wife and daughter as well. Per MD- Ok not to have taken it before cardioversion.

## 2022-08-31 NOTE — Discharge Instructions (Signed)

## 2022-08-31 NOTE — CV Procedure (Signed)
Procedure:   DCCV  Indication:  Symptomatic atrial fibrillation  Procedure Note:  The patient signed informed consent.  They have had had therapeutic anticoagulation with apixaban greater than 3 weeks.  Anesthesia was administered by Dr. Armond Hang.  Patient received 60 mg IV lidocaine and 70 mg IV propofol.Adequate airway was maintained throughout and vital followed per protocol.  They were cardioverted x 1 with 150J of biphasic synchronized energy.  They converted to NSR.  There were no apparent complications.  The patient had normal neuro status and respiratory status post procedure with vitals stable as recorded elsewhere.    Follow up:  They will continue on current medical therapy and follow up with cardiology as scheduled.  Jodelle Red, MD PhD 08/31/2022 8:40 AM

## 2022-08-31 NOTE — Interval H&P Note (Signed)
History and Physical Interval Note:  08/31/2022 7:53 AM  Jason [name]  has presented today for surgery, with the diagnosis of AFIB.  The various methods of treatment have been discussed with the patient and family. After consideration of risks, benefits and other options for treatment, the patient has consented to  Procedure(s): CARDIOVERSION (N/A) as a surgical intervention.  The patient's history has been reviewed, patient examined, no change in status, stable for surgery.  I have reviewed the patient's chart and labs.  Questions were answered to the patient's satisfaction.     Vania Rosero Cristal Deer

## 2022-08-31 NOTE — Anesthesia Postprocedure Evaluation (Signed)
Anesthesia Post Note  Patient: Jason [name]  Procedure(s) Performed: CARDIOVERSION     Patient location during evaluation: PACU Anesthesia Type: General Level of consciousness: awake and alert Pain management: pain level controlled Vital Signs Assessment: post-procedure vital signs reviewed and stable Respiratory status: spontaneous breathing, nonlabored ventilation, respiratory function stable and patient connected to nasal cannula oxygen Cardiovascular status: blood pressure returned to baseline and stable Postop Assessment: no apparent nausea or vomiting Anesthetic complications: no  No notable events documented.  Last Vitals:  Vitals:   08/31/22 0850 08/31/22 0900  BP: 124/84 122/88  Pulse: 67 64  Resp: 15 13  Temp:    SpO2: 100% 100%    Last Pain:  Vitals:   08/31/22 0844  TempSrc: Temporal  PainSc: 0-No pain                 Vasili Fok L Chanz Cahall

## 2022-09-04 ENCOUNTER — Other Ambulatory Visit: Payer: Self-pay | Admitting: Neurology

## 2022-09-04 NOTE — Telephone Encounter (Signed)
Requested Prescriptions   Pending Prescriptions Disp Refills   pregabalin (LYRICA) 200 MG capsule [Pharmacy Med Name: pregabalin 200 mg capsule] 90 capsule 3    Sig: TAKE 1 CAPSULE BY MOUTH THREE TIMES DAILY   Last seen 01/31/22, next appt not scheduled Dispenses   Dispensed Days Supply Quantity Provider Pharmacy  pregabalin 200 mg capsule 08/15/2022 30 90 each Levert Feinstein, MD [facility] [place], ...  pregabalin 200 mg capsule 07/17/2022 30 90 each Levert Feinstein, MD [facility] [place], ...  pregabalin 200 mg capsule 06/15/2022 30 90 each Levert Feinstein, MD [facility] [place], ...  pregabalin 200 mg capsule 05/16/2022 30 90 each Levert Feinstein, MD [facility] [place], ...  pregabalin 200 mg capsule 04/16/2022 30 90 each Levert Feinstein, MD [facility] [place], ...  pregabalin 200 mg capsule 03/15/2022 30 90 each Levert Feinstein, MD [facility] [place], ...  pregabalin 200 mg capsule 02/01/2022 30 90 each Levert Feinstein, MD [facility] [place], ...  pregabalin 200 mg capsule 01/05/2022 30 90 each Levert Feinstein, MD [facility] [place], ...  pregabalin 200 mg capsule 12/05/2021 30 90 each Glean Salvo, NP [facility] [place], ...  pregabalin 200 mg capsule 10/26/2021 30 90 each Glean Salvo, NP [facility] [place], ...  pregabalin 200 mg capsule 09/25/2021 30 90 each Glean Salvo, NP [facility] [place], .Marland KitchenMarland Kitchen

## 2022-09-06 DIAGNOSIS — Z7189 Other specified counseling: Secondary | ICD-10-CM | POA: Diagnosis not present

## 2022-09-06 DIAGNOSIS — M47816 Spondylosis without myelopathy or radiculopathy, lumbar region: Secondary | ICD-10-CM | POA: Diagnosis not present

## 2022-09-06 DIAGNOSIS — L97909 Non-pressure chronic ulcer of unspecified part of unspecified lower leg with unspecified severity: Secondary | ICD-10-CM | POA: Diagnosis not present

## 2022-09-06 DIAGNOSIS — I4891 Unspecified atrial fibrillation: Secondary | ICD-10-CM | POA: Diagnosis not present

## 2022-09-06 DIAGNOSIS — Z1339 Encounter for screening examination for other mental health and behavioral disorders: Secondary | ICD-10-CM | POA: Diagnosis not present

## 2022-09-06 DIAGNOSIS — I10 Essential (primary) hypertension: Secondary | ICD-10-CM | POA: Diagnosis not present

## 2022-09-06 DIAGNOSIS — Z0000 Encounter for general adult medical examination without abnormal findings: Secondary | ICD-10-CM | POA: Diagnosis not present

## 2022-09-06 DIAGNOSIS — Z87891 Personal history of nicotine dependence: Secondary | ICD-10-CM | POA: Diagnosis not present

## 2022-09-06 DIAGNOSIS — E1165 Type 2 diabetes mellitus with hyperglycemia: Secondary | ICD-10-CM | POA: Diagnosis not present

## 2022-09-06 DIAGNOSIS — Z1331 Encounter for screening for depression: Secondary | ICD-10-CM | POA: Diagnosis not present

## 2022-09-11 DIAGNOSIS — C641 Malignant neoplasm of right kidney, except renal pelvis: Secondary | ICD-10-CM | POA: Diagnosis not present

## 2022-09-12 ENCOUNTER — Other Ambulatory Visit: Payer: Self-pay | Admitting: Neurology

## 2022-09-15 DIAGNOSIS — M4302 Spondylolysis, cervical region: Secondary | ICD-10-CM | POA: Diagnosis not present

## 2022-09-15 DIAGNOSIS — M5412 Radiculopathy, cervical region: Secondary | ICD-10-CM | POA: Diagnosis not present

## 2022-09-15 DIAGNOSIS — M542 Cervicalgia: Secondary | ICD-10-CM | POA: Diagnosis not present

## 2022-09-18 DIAGNOSIS — R911 Solitary pulmonary nodule: Secondary | ICD-10-CM | POA: Diagnosis not present

## 2022-09-18 DIAGNOSIS — C641 Malignant neoplasm of right kidney, except renal pelvis: Secondary | ICD-10-CM | POA: Diagnosis not present

## 2022-09-20 DIAGNOSIS — M791 Myalgia, unspecified site: Secondary | ICD-10-CM | POA: Diagnosis not present

## 2022-09-20 DIAGNOSIS — M792 Neuralgia and neuritis, unspecified: Secondary | ICD-10-CM | POA: Diagnosis not present

## 2022-09-20 DIAGNOSIS — G894 Chronic pain syndrome: Secondary | ICD-10-CM | POA: Diagnosis not present

## 2022-09-20 DIAGNOSIS — M47816 Spondylosis without myelopathy or radiculopathy, lumbar region: Secondary | ICD-10-CM | POA: Diagnosis not present

## 2022-09-20 DIAGNOSIS — M545 Low back pain, unspecified: Secondary | ICD-10-CM | POA: Diagnosis not present

## 2022-09-21 ENCOUNTER — Ambulatory Visit: Payer: Medicare PPO | Attending: Cardiovascular Disease | Admitting: Cardiovascular Disease

## 2022-09-21 ENCOUNTER — Encounter: Payer: Self-pay | Admitting: Cardiovascular Disease

## 2022-09-25 DIAGNOSIS — M542 Cervicalgia: Secondary | ICD-10-CM | POA: Diagnosis not present

## 2022-09-25 DIAGNOSIS — M4302 Spondylolysis, cervical region: Secondary | ICD-10-CM | POA: Diagnosis not present

## 2022-09-25 DIAGNOSIS — M5412 Radiculopathy, cervical region: Secondary | ICD-10-CM | POA: Diagnosis not present

## 2022-09-26 DIAGNOSIS — Z880 Allergy status to penicillin: Secondary | ICD-10-CM | POA: Diagnosis not present

## 2022-09-26 DIAGNOSIS — I83029 Varicose veins of left lower extremity with ulcer of unspecified site: Secondary | ICD-10-CM | POA: Diagnosis not present

## 2022-09-26 DIAGNOSIS — G629 Polyneuropathy, unspecified: Secondary | ICD-10-CM | POA: Diagnosis not present

## 2022-09-26 DIAGNOSIS — R7303 Prediabetes: Secondary | ICD-10-CM | POA: Diagnosis not present

## 2022-09-26 DIAGNOSIS — M1990 Unspecified osteoarthritis, unspecified site: Secondary | ICD-10-CM | POA: Diagnosis not present

## 2022-09-26 DIAGNOSIS — Z7901 Long term (current) use of anticoagulants: Secondary | ICD-10-CM | POA: Diagnosis not present

## 2022-09-26 DIAGNOSIS — L97929 Non-pressure chronic ulcer of unspecified part of left lower leg with unspecified severity: Secondary | ICD-10-CM | POA: Diagnosis not present

## 2022-09-26 DIAGNOSIS — I872 Venous insufficiency (chronic) (peripheral): Secondary | ICD-10-CM | POA: Diagnosis not present

## 2022-09-26 DIAGNOSIS — I890 Lymphedema, not elsewhere classified: Secondary | ICD-10-CM | POA: Diagnosis not present

## 2022-09-26 DIAGNOSIS — Z87891 Personal history of nicotine dependence: Secondary | ICD-10-CM | POA: Diagnosis not present

## 2022-09-28 DIAGNOSIS — N3941 Urge incontinence: Secondary | ICD-10-CM | POA: Diagnosis not present

## 2022-09-28 DIAGNOSIS — C641 Malignant neoplasm of right kidney, except renal pelvis: Secondary | ICD-10-CM | POA: Diagnosis not present

## 2022-09-28 DIAGNOSIS — N401 Enlarged prostate with lower urinary tract symptoms: Secondary | ICD-10-CM | POA: Diagnosis not present

## 2022-10-08 DIAGNOSIS — I890 Lymphedema, not elsewhere classified: Secondary | ICD-10-CM | POA: Diagnosis not present

## 2022-10-08 DIAGNOSIS — R52 Pain, unspecified: Secondary | ICD-10-CM | POA: Diagnosis not present

## 2022-10-15 DIAGNOSIS — M542 Cervicalgia: Secondary | ICD-10-CM | POA: Diagnosis not present

## 2022-10-15 DIAGNOSIS — M5412 Radiculopathy, cervical region: Secondary | ICD-10-CM | POA: Diagnosis not present

## 2022-10-15 DIAGNOSIS — M4302 Spondylolysis, cervical region: Secondary | ICD-10-CM | POA: Diagnosis not present

## 2022-10-18 DIAGNOSIS — E1165 Type 2 diabetes mellitus with hyperglycemia: Secondary | ICD-10-CM | POA: Diagnosis not present

## 2022-10-18 DIAGNOSIS — L97909 Non-pressure chronic ulcer of unspecified part of unspecified lower leg with unspecified severity: Secondary | ICD-10-CM | POA: Diagnosis not present

## 2022-10-18 DIAGNOSIS — I10 Essential (primary) hypertension: Secondary | ICD-10-CM | POA: Diagnosis not present

## 2022-10-18 DIAGNOSIS — I4891 Unspecified atrial fibrillation: Secondary | ICD-10-CM | POA: Diagnosis not present

## 2022-10-18 DIAGNOSIS — I509 Heart failure, unspecified: Secondary | ICD-10-CM | POA: Diagnosis not present

## 2022-10-18 DIAGNOSIS — Z299 Encounter for prophylactic measures, unspecified: Secondary | ICD-10-CM | POA: Diagnosis not present

## 2022-10-20 DIAGNOSIS — G4733 Obstructive sleep apnea (adult) (pediatric): Secondary | ICD-10-CM | POA: Diagnosis not present

## 2022-10-20 DIAGNOSIS — E785 Hyperlipidemia, unspecified: Secondary | ICD-10-CM | POA: Diagnosis not present

## 2022-10-20 DIAGNOSIS — M1990 Unspecified osteoarthritis, unspecified site: Secondary | ICD-10-CM | POA: Diagnosis not present

## 2022-10-20 DIAGNOSIS — F419 Anxiety disorder, unspecified: Secondary | ICD-10-CM | POA: Diagnosis not present

## 2022-10-20 DIAGNOSIS — L97909 Non-pressure chronic ulcer of unspecified part of unspecified lower leg with unspecified severity: Secondary | ICD-10-CM | POA: Diagnosis not present

## 2022-10-20 DIAGNOSIS — Z6835 Body mass index (BMI) 35.0-35.9, adult: Secondary | ICD-10-CM | POA: Diagnosis not present

## 2022-10-20 DIAGNOSIS — I700 Atherosclerosis of aorta: Secondary | ICD-10-CM | POA: Diagnosis not present

## 2022-10-20 DIAGNOSIS — K219 Gastro-esophageal reflux disease without esophagitis: Secondary | ICD-10-CM | POA: Diagnosis not present

## 2022-10-20 DIAGNOSIS — Z7984 Long term (current) use of oral hypoglycemic drugs: Secondary | ICD-10-CM | POA: Diagnosis not present

## 2022-10-20 DIAGNOSIS — I951 Orthostatic hypotension: Secondary | ICD-10-CM | POA: Diagnosis not present

## 2022-10-20 DIAGNOSIS — N3941 Urge incontinence: Secondary | ICD-10-CM | POA: Diagnosis not present

## 2022-10-20 DIAGNOSIS — I2510 Atherosclerotic heart disease of native coronary artery without angina pectoris: Secondary | ICD-10-CM | POA: Diagnosis not present

## 2022-10-20 DIAGNOSIS — I472 Ventricular tachycardia, unspecified: Secondary | ICD-10-CM | POA: Diagnosis not present

## 2022-10-20 DIAGNOSIS — Z7901 Long term (current) use of anticoagulants: Secondary | ICD-10-CM | POA: Diagnosis not present

## 2022-10-20 DIAGNOSIS — N400 Enlarged prostate without lower urinary tract symptoms: Secondary | ICD-10-CM | POA: Diagnosis not present

## 2022-10-20 DIAGNOSIS — I8390 Asymptomatic varicose veins of unspecified lower extremity: Secondary | ICD-10-CM | POA: Diagnosis not present

## 2022-10-20 DIAGNOSIS — E1122 Type 2 diabetes mellitus with diabetic chronic kidney disease: Secondary | ICD-10-CM | POA: Diagnosis not present

## 2022-10-20 DIAGNOSIS — Z9181 History of falling: Secondary | ICD-10-CM | POA: Diagnosis not present

## 2022-10-22 DIAGNOSIS — I890 Lymphedema, not elsewhere classified: Secondary | ICD-10-CM | POA: Diagnosis not present

## 2022-10-22 DIAGNOSIS — R52 Pain, unspecified: Secondary | ICD-10-CM | POA: Diagnosis not present

## 2022-10-25 DIAGNOSIS — M4302 Spondylolysis, cervical region: Secondary | ICD-10-CM | POA: Diagnosis not present

## 2022-10-25 DIAGNOSIS — M5412 Radiculopathy, cervical region: Secondary | ICD-10-CM | POA: Diagnosis not present

## 2022-10-25 DIAGNOSIS — M47816 Spondylosis without myelopathy or radiculopathy, lumbar region: Secondary | ICD-10-CM | POA: Diagnosis not present

## 2022-10-25 DIAGNOSIS — M542 Cervicalgia: Secondary | ICD-10-CM | POA: Diagnosis not present

## 2022-11-05 DIAGNOSIS — I4819 Other persistent atrial fibrillation: Secondary | ICD-10-CM | POA: Diagnosis not present

## 2022-11-05 DIAGNOSIS — R52 Pain, unspecified: Secondary | ICD-10-CM | POA: Diagnosis not present

## 2022-11-05 DIAGNOSIS — I5022 Chronic systolic (congestive) heart failure: Secondary | ICD-10-CM | POA: Diagnosis not present

## 2022-11-05 DIAGNOSIS — G4730 Sleep apnea, unspecified: Secondary | ICD-10-CM | POA: Diagnosis not present

## 2022-11-05 DIAGNOSIS — I890 Lymphedema, not elsewhere classified: Secondary | ICD-10-CM | POA: Diagnosis not present

## 2022-11-12 ENCOUNTER — Other Ambulatory Visit: Payer: Self-pay

## 2022-11-12 DIAGNOSIS — R52 Pain, unspecified: Secondary | ICD-10-CM | POA: Diagnosis not present

## 2022-11-12 DIAGNOSIS — I890 Lymphedema, not elsewhere classified: Secondary | ICD-10-CM | POA: Diagnosis not present

## 2022-11-12 MED ORDER — NEUPRO 4 MG/24HR TD PT24: MEDICATED_PATCH | TRANSDERMAL | 5 refills | Status: DC

## 2022-11-12 MED ORDER — DULOXETINE HCL 60 MG PO CPEP: 60.0000 mg | ORAL_CAPSULE | Freq: Two times a day (BID) | ORAL | 3 refills | Status: AC

## 2022-11-12 MED ORDER — PREGABALIN 200 MG PO CAPS: 200.0000 mg | ORAL_CAPSULE | Freq: Three times a day (TID) | ORAL | 3 refills | Status: DC

## 2022-11-12 NOTE — Telephone Encounter (Signed)
Requested Prescriptions   Pending Prescriptions Disp Refills   pregabalin (LYRICA) 200 MG capsule 90 capsule 3    Sig: Take 1 capsule (200 mg total) by mouth 3 (three) times daily.   Last seen 01/31/22, next appt scheduled 12/06/22   Dispenses   Dispensed Days Supply Quantity Provider Pharmacy  pregabalin 200 mg capsule 08/15/2022 30 90 each Levert Feinstein, MD [facility] [place], ...  pregabalin 200 mg capsule 07/17/2022 30 90 each Levert Feinstein, MD [facility] [place], ...  pregabalin 200 mg capsule 06/15/2022 30 90 each Levert Feinstein, MD [facility] [place], ...  pregabalin 200 mg capsule 05/16/2022 30 90 each Levert Feinstein, MD [facility] [place], ...  pregabalin 200 mg capsule 04/16/2022 30 90 each Levert Feinstein, MD [facility] [place], ...  pregabalin 200 mg capsule 03/15/2022 30 90 each Levert Feinstein, MD [facility] [place], ...  pregabalin 200 mg capsule 02/01/2022 30 90 each Levert Feinstein, MD [facility] [place], ...  pregabalin 200 mg capsule 01/05/2022 30 90 each Levert Feinstein, MD [facility] [place], ...  pregabalin 200 mg capsule 12/05/2021 30 90 each Glean Salvo, NP [facility] [place], .Marland KitchenMarland Kitchen

## 2022-11-15 DIAGNOSIS — M545 Low back pain, unspecified: Secondary | ICD-10-CM | POA: Diagnosis not present

## 2022-11-15 DIAGNOSIS — M542 Cervicalgia: Secondary | ICD-10-CM | POA: Diagnosis not present

## 2022-11-15 DIAGNOSIS — M5412 Radiculopathy, cervical region: Secondary | ICD-10-CM | POA: Diagnosis not present

## 2022-11-15 DIAGNOSIS — M791 Myalgia, unspecified site: Secondary | ICD-10-CM | POA: Diagnosis not present

## 2022-11-15 DIAGNOSIS — M47816 Spondylosis without myelopathy or radiculopathy, lumbar region: Secondary | ICD-10-CM | POA: Diagnosis not present

## 2022-11-15 DIAGNOSIS — G894 Chronic pain syndrome: Secondary | ICD-10-CM | POA: Diagnosis not present

## 2022-11-15 DIAGNOSIS — M4302 Spondylolysis, cervical region: Secondary | ICD-10-CM | POA: Diagnosis not present

## 2022-11-15 DIAGNOSIS — M792 Neuralgia and neuritis, unspecified: Secondary | ICD-10-CM | POA: Diagnosis not present

## 2022-11-16 ENCOUNTER — Ambulatory Visit: Payer: Medicare PPO | Admitting: Podiatry

## 2022-11-16 DIAGNOSIS — E1142 Type 2 diabetes mellitus with diabetic polyneuropathy: Secondary | ICD-10-CM

## 2022-11-16 DIAGNOSIS — M79674 Pain in right toe(s): Secondary | ICD-10-CM | POA: Diagnosis not present

## 2022-11-16 DIAGNOSIS — M79675 Pain in left toe(s): Secondary | ICD-10-CM

## 2022-11-16 DIAGNOSIS — I872 Venous insufficiency (chronic) (peripheral): Secondary | ICD-10-CM | POA: Diagnosis not present

## 2022-11-16 DIAGNOSIS — B351 Tinea unguium: Secondary | ICD-10-CM

## 2022-11-16 NOTE — Progress Notes (Signed)
  Subjective:  Patient ID: Jason [name], male    DOB: Sep 25, 1945,  MRN: [id number]  Chief Complaint  Patient presents with   Diabetes    DFC/EXAM  TG:GYIRSWNIOE A1C - 5.7 BLOOD THINNER    77 y.o. male presents with the above complaint. History confirmed with patient. Patient presenting with pain related to dystrophic thickened elongated nails. Patient is unable to trim own nails related to nail dystrophy and/or mobility issues. Patient does have a history of T2DM. Patient does have significant neuropathy in the feet.  He also has history of venous stasis ulcerations but has recently started compression therapy which is helping  Objective:  Physical Exam: warm, good capillary refill nail exam onychomycosis of the toenails, onycholysis, and dystrophic nails DP pulses palpable, PT pulses palpable, and protective sensation absent Left Foot:  Pain with palpation of nails due to elongation and dystrophic growth.  Right Foot: Pain with palpation of nails due to elongation and dystrophic growth.   Assessment:   1. Pain due to onychomycosis of toenails of both feet   2. Chronic venous insufficiency of lower extremity   3. DM type 2 with diabetic peripheral neuropathy (HCC)      Plan:  Patient was evaluated and treated and all questions answered.  # DM2 with peripheral neuropathy and edema associated with venous stasis ulcerations bilateral lower extremity Patient educated on diabetes. Discussed proper diabetic foot care and discussed risks and complications of disease. Educated patient in depth on reasons to return to the office immediately should he/she discover anything concerning or new on the feet. All questions answered. Discussed proper shoes as well.  -Recommend monitoring for worsening venous stasis ulcerations continue to follow-up with [facility] and as needed -Discussed the importance of compression therapy including compression stockings and/or  pumps  #Onychomycosis with pain  -Nails palliatively debrided as below. -Educated on self-care  Procedure: Nail Debridement Rationale: Pain Type of Debridement: manual, sharp debridement. Instrumentation: Nail nipper, rotary burr. Number of Nails: 10  Return in about 3 months (around 02/16/2023) for [facility].         Corinna Gab, DPM [facility]

## 2022-11-19 DIAGNOSIS — I5022 Chronic systolic (congestive) heart failure: Secondary | ICD-10-CM | POA: Diagnosis not present

## 2022-11-19 DIAGNOSIS — I890 Lymphedema, not elsewhere classified: Secondary | ICD-10-CM | POA: Diagnosis not present

## 2022-11-19 DIAGNOSIS — R52 Pain, unspecified: Secondary | ICD-10-CM | POA: Diagnosis not present

## 2022-11-19 DIAGNOSIS — I517 Cardiomegaly: Secondary | ICD-10-CM | POA: Diagnosis not present

## 2022-11-25 DIAGNOSIS — M542 Cervicalgia: Secondary | ICD-10-CM | POA: Diagnosis not present

## 2022-11-25 DIAGNOSIS — M5412 Radiculopathy, cervical region: Secondary | ICD-10-CM | POA: Diagnosis not present

## 2022-11-25 DIAGNOSIS — M4302 Spondylolysis, cervical region: Secondary | ICD-10-CM | POA: Diagnosis not present

## 2022-11-28 DIAGNOSIS — I890 Lymphedema, not elsewhere classified: Secondary | ICD-10-CM | POA: Diagnosis not present

## 2022-11-28 DIAGNOSIS — R52 Pain, unspecified: Secondary | ICD-10-CM | POA: Diagnosis not present

## 2022-12-01 DIAGNOSIS — I890 Lymphedema, not elsewhere classified: Secondary | ICD-10-CM | POA: Diagnosis not present

## 2022-12-04 DIAGNOSIS — M47812 Spondylosis without myelopathy or radiculopathy, cervical region: Secondary | ICD-10-CM | POA: Diagnosis not present

## 2022-12-04 DIAGNOSIS — G8929 Other chronic pain: Secondary | ICD-10-CM | POA: Diagnosis not present

## 2022-12-04 DIAGNOSIS — M542 Cervicalgia: Secondary | ICD-10-CM | POA: Diagnosis not present

## 2022-12-05 DIAGNOSIS — R52 Pain, unspecified: Secondary | ICD-10-CM | POA: Diagnosis not present

## 2022-12-05 DIAGNOSIS — I890 Lymphedema, not elsewhere classified: Secondary | ICD-10-CM | POA: Diagnosis not present

## 2022-12-05 NOTE — Progress Notes (Deleted)
Patient: Jason [name] Date of Birth: [birth year]  Reason for Visit: Follow up for neuropathy, RLS History from: Patient, wife  Primary Neurologist: Dr. Terrace Arabia  ASSESSMENT AND PLAN 77 y.o. year old male  With 1.  Small fiber neuropathy, chronic pain 2.  Restless leg syndrome 3.  Chronic low back pain 4.  Severe OSA -[facility] options of chronic back pain, neuropathy -For now, continue Lyrica 200 mg 3 times daily -Continue Cymbalta 60 mg daily -I refilled tramadol to take as needed for severe pain, last refill was in June -Continue Neupro patch 4 mg/24 hours, uses Requip rarely for breakthrough pain -Reportedly has seen Dr. Maisie Fus neurosurgery for low back pain, has not been felt a surgical candidate -Not using CPAP, have encouraged to restart, once he is using again, let me know, I can pull data to ensure treatment is adequate -Follow-up in 6 months or sooner if needed  Addendum 03/01/2022 SS: seen at [facility] 02/19/22, plan PT for neck and back, cervical and lumbar ESI.  Follow-up in 6 to 8 weeks after PT.  HISTORY OF PRESENT ILLNESS: Today 12/05/22   Update 01/31/22 SS: Jason [name] is here today for follow-up. Is prescribed Lyrica 200 mg 3 times daily, but only takes 2 times daily. Has been taking mostly 3 times a day since increased pain to feet. Lyrica does help for sure. On Cymbalta 60 mg BID, Neupro patch 4 mg daily, takes Requip once a month, Tramadol PRN last filled in June. Has increased sharp pains to his feet from neuropathy. Has CPAP uses occasionally, I pulled download, don't see any data other than sporadic night or 2 in Sept. He bought 2 yorkie dogs, he loves them. He uses his medications sparingly. Uses cane if needed, 1 fall, no major. Has not seen Dr. Maisie Fus for follow-up. Has been told borderline diabetic.   Update 09/21/21  SS: Jason [name] is here today for follow-up.  Has remained off carbamazepine after a  questionable medication overdose last year.  On Lyrica 200 mg 3 times a day (but has only been taking 2, pharmacy only give 2 a day). On Neupro, 4 mg patch, this really helps. On Cymbalta 60 mg BID. Has had worsening neuropathy pain, slowly worsening overtime, pain/burning sensation. Is worsening up to his knees. Saw Dr. Maisie Fus last year. Increased low back pain. If he cooks, leans forward, has to rest after due to neck pain. Very rarely takes Requip. Has tramadol to take PRN, is almost out. Most bothersome is the neuropathy. 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 Has been told he has sleep apnea, but doesn't use CPAP. Severe OSA back in July 2021.  HISTORY  03/23/2021 MM: Jason [name] is a 77 year old male with a history of small fiber neuropathy and restless leg syndrome.  He returns today for follow-up.  The patient states that he was hospitalized several months ago for altered mental status.  Dr. Anne Hahn felt that it was medication overdose.  However the patient is now stating that his son counted his meds after the fact and he did not take any additional medication.  The patient has not restarted carbamazepine.  He is only been taking Lyrica 200 mg twice a day.  He states that since he has not restarted carbamazepine and taking a lower dose of Lyrica he has sharp shooting pains in the legs.  It is not constant but does occur  very frequently.  He is hoping to restart carbamazepine or increase Lyrica.  He continues on Neupro patch for restless legs.  He has Requip but takes it infrequently.  Only for breakthrough symptoms.  He returns today for an evaluation.  REVIEW OF SYSTEMS: Out of a complete 14 system review of symptoms, the patient complains only of the following symptoms, and all other reviewed systems are negative.  See HPI  ALLERGIES: Allergies  Allergen Reactions   Augmentin [Amoxicillin-Pot Clavulanate] Nausea Only    Did it involve swelling of the  face/tongue/throat, SOB, or low BP? No Did it involve sudden or severe rash/hives, skin peeling, or any reaction on the inside of your mouth or nose? No Did you need to seek medical attention at a hospital or doctor's office? No When did it last happen?15-20 years ago If all above answers are "NO", may proceed with cephalosporin use.    Penicillins Nausea Only    Did it involve swelling of the face/tongue/throat, SOB, or low BP? No Did it involve sudden or severe rash/hives, skin peeling, or any reaction on the inside of your mouth or nose? No Did you need to seek medical attention at a hospital or doctor's office? No When did it last happen?10-15 years ago If all above answers are "NO", may proceed with cephalosporin use.     Clavulanic Acid     HOME MEDICATIONS: Outpatient Medications Prior to Visit  Medication Sig Dispense Refill   amiodarone (PACERONE) 200 MG tablet Take 1 tablet by mouth daily.     apixaban (ELIQUIS) 5 MG TABS tablet Take 5 mg by mouth 2 (two) times daily.     [employer] FIBER GUMMIES PO Take 2 tablets by mouth daily.     DULoxetine (CYMBALTA) 60 MG capsule Take 1 capsule (60 mg total) by mouth 2 (two) times daily. 180 capsule 3   lisinopril (ZESTRIL) 2.5 MG tablet      metoprolol tartrate (LOPRESSOR) 25 MG tablet Take 25 mg by mouth every evening.      Multiple Vitamins-Minerals (ADULT GUMMY PO) Take 2 tablets by mouth daily.     pantoprazole (PROTONIX) 40 MG tablet Take 40 mg by mouth daily.     Polyethyl Glycol-Propyl Glycol 0.4-0.3 % SOLN Place 1 drop into both eyes 3 (three) times daily as needed (dry eyes).      pregabalin (LYRICA) 200 MG capsule Take 1 capsule (200 mg total) by mouth 3 (three) times daily. 90 capsule 3   promethazine (PHENERGAN) 25 MG tablet Take 12.5 mg by mouth every 6 (six) hours as needed for nausea.     rOPINIRole (REQUIP) 2 MG tablet TAKE 2 TABLETS BY MOUTH EVERY DAY AS NEEDED FOR BREAKTHROUGH PAIN 60 tablet 5   rosuvastatin (CRESTOR) 10 MG  tablet Take 10 mg by mouth daily.     rotigotine (NEUPRO) 4 MG/24HR place ONE PATCH onto THE SKIN DAILY 30 patch 5   tamsulosin (FLOMAX) 0.4 MG CAPS capsule Take 0.4 mg by mouth daily.     traMADol (ULTRAM) 50 MG tablet Take 1 tablet (50 mg total) by mouth every 6 (six) hours as needed. 30 tablet 0   No facility-administered medications prior to visit.    PAST MEDICAL HISTORY: Past Medical History:  Diagnosis Date   Diabetes mellitus without complication (HCC)    GERD (gastroesophageal reflux disease)    History of anemia    Childhood   History of cataract    History of kidney stones  History of migraine    resolved   Hypertension    states under control with med., has been on med. x 5 yr.   Insomnia    Low blood pressure    x3 hospitalized once for it   Memory difficulty 05/09/2018   MRSA (methicillin resistant staph aureus) culture positive 09/03/2018   Paresthesia 04/27/2015   Peripheral neuropathy 11/06/2017   RLS (restless legs syndrome) 04/27/2015   Seasonal allergies    Sleep apnea    no CPAP use, REPORTS HE NEEDS A NEW ONE    Staph aureus infection 09/03/2018   TIA (transient ischemic attack)    Unilateral osteoarthritis of knee 12/2014   left    PAST SURGICAL HISTORY: Past Surgical History:  Procedure Laterality Date   CARDIOVERSION N/A 08/31/2022   Procedure: CARDIOVERSION;  Surgeon: Jodelle Red, MD;  Location: [facility] INVASIVE CV LAB;  Service: Cardiovascular;  Laterality: N/A;   CATARACT EXTRACTION, BILATERAL     COLONOSCOPY     CYSTOSCOPY WITH URETEROSCOPY AND STENT PLACEMENT Bilateral 09/08/2018   Procedure: CYSTOSCOPY WITH BILATERAL  URETEROSCOPY HOLMIUM LASER AND STENT PLACEMENT;  Surgeon: Crista Elliot, MD;  Location: [facility] ORS;  Service: Urology;  Laterality: Bilateral;   LAPAROSCOPIC NEPHRECTOMY, HAND ASSISTED Right 10/15/2018   Procedure: HAND ASSISTED LAPAROSCOPIC NEPHRECTOMY;  Surgeon: Crista Elliot, MD;  Location: [facility];  Service: Urology;   Laterality: Right;   PARTIAL KNEE ARTHROPLASTY Right 06/02/2013   Procedure: RIGHT UNICOMPARTMENTAL KNEE;  Surgeon: Eulas Post, MD;  Location: [facility] OR;  Service: Orthopedics;  Laterality: Right;   PARTIAL KNEE ARTHROPLASTY Left 12/17/2014   Procedure: LEFT KNEE UNI ARTHROPLASTY ;  Surgeon: Teryl Lucy, MD;  Location: [facility];  Service: Orthopedics;  Laterality: Left;   TONSILLECTOMY  1950's   UPPER GI ENDOSCOPY      FAMILY HISTORY: Family History  Problem Relation Age of Onset   Emphysema Father        Died age 5   COPD Father    Sudden death Mother        Died age 84   CAD Brother        Died age 16   Hypertension Brother     SOCIAL HISTORY: Social History   Socioeconomic History   Marital status: Married    Spouse name: Aurea Graff   Number of children: 2   Years of education: 14   Highest education level: Not on file  Occupational History   Occupation: self-employed  Tobacco Use   Smoking status: Former    Types: Pipe   Smokeless tobacco: Never   Tobacco comments:    06/02/2013   Vaping Use   Vaping status: Never Used  Substance and Sexual Activity   Alcohol use: Yes    Comment: occasionally   Drug use: No   Sexual activity: Yes  Other Topics Concern   Not on file  Social History Narrative   Retiring from Product manager.     Patient drinks 1 cup of caffeine daily.   Patient is right handed.    Social Determinants of Health   Financial Resource Strain: Low Risk  (11/30/2020)   Received from [facility], [facility]   Overall Financial Resource Strain (CARDIA)    Difficulty of Paying Living Expenses: Not hard at all  Food Insecurity: No Food Insecurity (11/30/2020)   Received from [facility], [facility]   Hunger Vital Sign    Worried About Running Out  of Food in the Last Year: Never true    Ran Out of Food in the Last Year: Never true  Transportation Needs: No Transportation Needs (11/30/2020)   Received from [facility], [facility]  Health Care   [facility] - Transportation    Lack of Transportation (Medical): No    Lack of Transportation (Non-Medical): No  Physical Activity: Not on file  Stress: Not on file  Social Connections: Not on file  Intimate Partner Violence: Not on file   PHYSICAL EXAM  There were no vitals filed for this visit.   There is no height or weight on file to calculate BMI.  Generalized: Well developed, in no acute distress  Neurological examination  Mentation: Alert oriented to time, place, history taking. Follows all commands speech and language fluent Cranial nerve II-XII: Pupils were equal round reactive to light. Extraocular movements were full, visual field were full on confrontational test. Facial sensation and strength were normal. Head turning and shoulder shrug  were normal and symmetric. Motor: The motor testing reveals 5 over 5 strength of all 4 extremities. Good symmetric motor tone is noted throughout.  Sensory: Length dependent sensory deficit lower extremities to mid shin area, redness to feet more on the left Coordination: Cerebellar testing reveals good finger-nose-finger and heel-to-shin bilaterally.  Gait and station: Gait is antalgic, no assistive device, unsteady with tiptoe and heel walk, walks flat-footed, tandem gait is unsteady Reflexes: Deep tendon reflexes are symmetric and normal bilaterally.   DIAGNOSTIC DATA (LABS, IMAGING, TESTING) - I reviewed patient records, labs, notes, testing and imaging myself where available.  Lab Results  Component Value Date   WBC 6.7 11/17/2020   HGB 12.2 (L) 11/17/2020   HCT 35.4 (L) 11/17/2020   MCV 96 11/17/2020   PLT 198 11/17/2020      Component Value Date/Time   NA 140 11/17/2020 1303   K 4.7 11/17/2020 1303   CL 102 11/17/2020 1303   CO2 25 11/17/2020 1303   GLUCOSE 123 (H) 11/17/2020 1303   GLUCOSE 111 (H) 10/16/2018 0426   BUN 19 11/17/2020 1303   CREATININE 1.06 11/17/2020 1303   CALCIUM 8.9 11/17/2020 1303    PROT 6.7 11/17/2020 1303   ALBUMIN 4.1 11/17/2020 1303   AST 45 (H) 11/17/2020 1303   ALT 31 11/17/2020 1303   ALKPHOS 113 11/17/2020 1303   BILITOT 0.4 11/17/2020 1303   GFRNONAA 70 09/14/2019 1523   GFRAA 81 09/14/2019 1523   No results found for: "CHOL", "HDL", "LDLCALC", "LDLDIRECT", "TRIG", "CHOLHDL" Lab Results  Component Value Date   HGBA1C 6.6 (H) 09/14/2019   Lab Results  Component Value Date   VITAMINB12 477 04/08/2017   No results found for: "TSH"  Margie Ege, AGNP-C, DNP 12/05/2022, 8:52 AM [facility] [address] [place], [place] [address] [phone]

## 2022-12-06 ENCOUNTER — Telehealth: Payer: Self-pay

## 2022-12-06 ENCOUNTER — Ambulatory Visit: Payer: Medicare PPO | Admitting: Neurology

## 2022-12-06 NOTE — Telephone Encounter (Signed)
Call to patient, left message that visit would need to be rescheduled due to no power.

## 2022-12-16 DIAGNOSIS — M5412 Radiculopathy, cervical region: Secondary | ICD-10-CM | POA: Diagnosis not present

## 2022-12-16 DIAGNOSIS — M4302 Spondylolysis, cervical region: Secondary | ICD-10-CM | POA: Diagnosis not present

## 2022-12-16 DIAGNOSIS — M542 Cervicalgia: Secondary | ICD-10-CM | POA: Diagnosis not present

## 2022-12-19 DIAGNOSIS — R52 Pain, unspecified: Secondary | ICD-10-CM | POA: Diagnosis not present

## 2022-12-19 DIAGNOSIS — I890 Lymphedema, not elsewhere classified: Secondary | ICD-10-CM | POA: Diagnosis not present

## 2022-12-20 DIAGNOSIS — M47816 Spondylosis without myelopathy or radiculopathy, lumbar region: Secondary | ICD-10-CM | POA: Diagnosis not present

## 2022-12-20 NOTE — Progress Notes (Signed)
Patient: Jason [name] Date of Birth: Oct 07, 1945  Reason for Visit: Follow up for neuropathy, RLS History from: Patient, wife  Primary Neurologist: Dr. Terrace Arabia  ASSESSMENT AND PLAN 77 y.o. year old male  With 1.  Small fiber neuropathy, chronic pain 2.  Restless leg syndrome 3.  Chronic low back pain 4.  Severe OSA -Continue at [facility] for [facility] pain, will send a referral over to see if they can assist with neuropathy pain management -For now, continue Lyrica 200 mg 3 times daily -Continue Cymbalta 60 mg daily -Continue Neupro patch 4 mg/24 hours, uses Requip rarely for breakthrough pain -Reportedly has seen Dr. Maisie Fus neurosurgery for low back pain, has not been felt a surgical candidate -Not using CPAP, have encouraged to restart, once he is using again, let me know, I can pull data to ensure treatment is adequate -Follow-up in 6 months or sooner if needed  HISTORY OF PRESENT ILLNESS: Today 12/24/22 Has been seeing [facility]. Had radiofrequency ablation to lumbar spine last week, will also being having cervical ablation. Has had ESI with them, has done PT. Very pleased with pain center. Wearing compression stocking velcro wraps for legs, has ordered boots, has been having lymphedema, developed sores to his legs. Went to wound center. Lymphedema for around 1 year. Doing OT. AFIB with cardioversion in May 2024. Remains on Cymbalta 60 mg BID, Lyrica 200 mg TID, Neupro is a life saver for him, has switched another insurance, not sure what the coverage will be. Rarely has to use Requip for breakthrough only if he forgets to change the patch. In general RLS under great control. His feet are numb, but they hurt and burn. Has not fallen, but uses a cane for safety all the time, balance is issue. Is diabetic now, on oral medication.   Update 01/31/22 SS: Jason [name] is here today for follow-up. Is prescribed Lyrica 200 mg 3 times daily, but only takes 2 times  daily. Has been taking mostly 3 times a day since increased pain to feet. Lyrica does help for sure. On Cymbalta 60 mg BID, Neupro patch 4 mg daily, takes Requip once a month, Tramadol PRN last filled in June. Has increased sharp pains to his feet from neuropathy. Has CPAP uses occasionally, I pulled download, don't see any data other than sporadic night or 2 in Sept. He bought 2 yorkie dogs, he loves them. He uses his medications sparingly. Uses cane if needed, 1 fall, no major. Has not seen Dr. Maisie Fus for follow-up. Has been told borderline diabetic.   Update 09/21/21  SS: Jason [name] is here today for follow-up.  Has remained off carbamazepine after a questionable medication overdose last year.  On Lyrica 200 mg 3 times a day (but has only been taking 2, pharmacy only give 2 a day). On Neupro, 4 mg patch, this really helps. On Cymbalta 60 mg BID. Has had worsening neuropathy pain, slowly worsening overtime, pain/burning sensation. Is worsening up to his knees. Saw Dr. Maisie Fus last year. Increased low back pain. If he cooks, leans forward, has to rest after due to neck pain. Very rarely takes Requip. Has tramadol to take PRN, is almost out. Most bothersome is the neuropathy. 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 Has been told he has sleep apnea, but doesn't use CPAP. Severe OSA back in July 2021.  HISTORY  03/23/2021 MM: Jason [name] is a 77 year old male with  a history of small fiber neuropathy and restless leg syndrome.  He returns today for follow-up.  The patient states that he was hospitalized several months ago for altered mental status.  Dr. Anne Hahn felt that it was medication overdose.  However the patient is now stating that his son counted his meds after the fact and he did not take any additional medication.  The patient has not restarted carbamazepine.  He is only been taking Lyrica 200 mg twice a day.  He states that since he has not restarted carbamazepine and  taking a lower dose of Lyrica he has sharp shooting pains in the legs.  It is not constant but does occur very frequently.  He is hoping to restart carbamazepine or increase Lyrica.  He continues on Neupro patch for restless legs.  He has Requip but takes it infrequently.  Only for breakthrough symptoms.  He returns today for an evaluation.  REVIEW OF SYSTEMS: Out of a complete 14 system review of symptoms, the patient complains only of the following symptoms, and all other reviewed systems are negative.  See HPI  ALLERGIES: Allergies  Allergen Reactions   Augmentin [Amoxicillin-Pot Clavulanate] Nausea Only    Did it involve swelling of the face/tongue/throat, SOB, or low BP? No Did it involve sudden or severe rash/hives, skin peeling, or any reaction on the inside of your mouth or nose? No Did you need to seek medical attention at a hospital or doctor's office? No When did it last happen?15-20 years ago If all above answers are "NO", may proceed with cephalosporin use.    Penicillins Nausea Only    Did it involve swelling of the face/tongue/throat, SOB, or low BP? No Did it involve sudden or severe rash/hives, skin peeling, or any reaction on the inside of your mouth or nose? No Did you need to seek medical attention at a hospital or doctor's office? No When did it last happen?10-15 years ago If all above answers are "NO", may proceed with cephalosporin use.     Clavulanic Acid     HOME MEDICATIONS: Outpatient Medications Prior to Visit  Medication Sig Dispense Refill   amiodarone (PACERONE) 200 MG tablet Take 1 tablet by mouth daily.     apixaban (ELIQUIS) 5 MG TABS tablet Take 5 mg by mouth 2 (two) times daily.     [employer] FIBER GUMMIES PO Take 2 tablets by mouth daily.     DULoxetine (CYMBALTA) 60 MG capsule Take 1 capsule (60 mg total) by mouth 2 (two) times daily. 180 capsule 3   lisinopril (ZESTRIL) 2.5 MG tablet      metoprolol tartrate (LOPRESSOR) 25 MG tablet Take 25 mg by  mouth every evening.      Multiple Vitamins-Minerals (ADULT GUMMY PO) Take 2 tablets by mouth daily.     pantoprazole (PROTONIX) 40 MG tablet Take 40 mg by mouth daily.     Polyethyl Glycol-Propyl Glycol 0.4-0.3 % SOLN Place 1 drop into both eyes 3 (three) times daily as needed (dry eyes).      pregabalin (LYRICA) 200 MG capsule Take 1 capsule (200 mg total) by mouth 3 (three) times daily. 90 capsule 3   promethazine (PHENERGAN) 25 MG tablet Take 12.5 mg by mouth every 6 (six) hours as needed for nausea.     rOPINIRole (REQUIP) 2 MG tablet TAKE 2 TABLETS BY MOUTH EVERY DAY AS NEEDED FOR BREAKTHROUGH PAIN 60 tablet 5   rosuvastatin (CRESTOR) 10 MG tablet Take 10 mg by mouth daily.  rotigotine (NEUPRO) 4 MG/24HR place ONE PATCH onto THE SKIN DAILY 30 patch 5   tamsulosin (FLOMAX) 0.4 MG CAPS capsule Take 0.4 mg by mouth daily.     traMADol (ULTRAM) 50 MG tablet Take 1 tablet (50 mg total) by mouth every 6 (six) hours as needed. 30 tablet 0   No facility-administered medications prior to visit.    PAST MEDICAL HISTORY: Past Medical History:  Diagnosis Date   Diabetes mellitus without complication (HCC)    GERD (gastroesophageal reflux disease)    History of anemia    Childhood   History of cataract    History of kidney stones    History of migraine    resolved   Hypertension    states under control with med., has been on med. x 5 yr.   Insomnia    Low blood pressure    x3 hospitalized once for it   Memory difficulty 05/09/2018   MRSA (methicillin resistant staph aureus) culture positive 09/03/2018   Paresthesia 04/27/2015   Peripheral neuropathy 11/06/2017   RLS (restless legs syndrome) 04/27/2015   Seasonal allergies    Sleep apnea    no CPAP use, REPORTS HE NEEDS A NEW ONE    Staph aureus infection 09/03/2018   TIA (transient ischemic attack)    Unilateral osteoarthritis of knee 12/2014   left    PAST SURGICAL HISTORY: Past Surgical History:  Procedure Laterality Date    CARDIOVERSION N/A 08/31/2022   Procedure: CARDIOVERSION;  Surgeon: Jodelle Red, MD;  Location[facility] INVASIVE CV LAB;  Service: Cardiovascular;  Laterality: N/A;   CATARACT EXTRACTION, BILATERAL     COLONOSCOPY     CYSTOSCOPY WITH URETEROSCOPY AND STENT PLACEMENT Bilateral 09/08/2018   Procedure: CYSTOSCOPY WITH BILATERAL  URETEROSCOPY HOLMIUM LASER AND STENT PLACEMENT;  Surgeon: Crista Elliot, MD;  Location: [facility];  Service: Urology;  Laterality: Bilateral;   LAPAROSCOPIC NEPHRECTOMY, HAND ASSISTED Right 10/15/2018   Procedure: HAND ASSISTED LAPAROSCOPIC NEPHRECTOMY;  Surgeon: Crista Elliot, MD;  Location: [facility];  Service: Urology;  Laterality: Right;   PARTIAL KNEE ARTHROPLASTY Right 06/02/2013   Procedure: RIGHT UNICOMPARTMENTAL KNEE;  Surgeon: Eulas Post, MD;  Location: [facility];  Service: Orthopedics;  Laterality: Right;   PARTIAL KNEE ARTHROPLASTY Left 12/17/2014   Procedure: LEFT KNEE UNI ARTHROPLASTY ;  Surgeon: Teryl Lucy, MD;  Location: [facility];  Service: Orthopedics;  Laterality: Left;   TONSILLECTOMY  1950's   UPPER GI ENDOSCOPY      FAMILY HISTORY: Family History  Problem Relation Age of Onset   Emphysema Father        Died age 22   COPD Father    Sudden death Mother        Died age 38   CAD Brother        Died age 72   Hypertension Brother     SOCIAL HISTORY: Social History   Socioeconomic History   Marital status: Married    Spouse name: Aurea Graff   Number of children: 2   Years of education: 14   Highest education level: Not on file  Occupational History   Occupation: self-employed  Tobacco Use   Smoking status: Former    Types: Pipe   Smokeless tobacco: Never   Tobacco comments:    06/02/2013   Vaping Use   Vaping status: Never Used  Substance and Sexual Activity   Alcohol use: Yes    Comment: occasionally   Drug use: No  Sexual activity: Yes  Other Topics Concern   Not on file  Social History Narrative   Retiring  from [employer].     Patient drinks 1 cup of caffeine daily.   Patient is right handed.    Social Determinants of Health   Financial Resource Strain: Low Risk  (11/30/2020)   Received [facility], [facility]   Overall Financial Resource Strain (CARDIA)    Difficulty of Paying Living Expenses: Not hard at all  Food Insecurity: No Food Insecurity (11/30/2020)   Received from [facility], [facility]   Hunger Vital Sign    Worried About Running Out of Food in the Last Year: Never true    Ran Out of Food in the Last Year: Never true  Transportation Needs: No Transportation Needs (11/30/2020)   Received from [facility], [facility]   [facility] - Transportation    Lack of Transportation (Medical): No    Lack of Transportation (Non-Medical): No  Physical Activity: Not on file  Stress: Not on file  Social Connections: Not on file  Intimate Partner Violence: Not on file   PHYSICAL EXAM  Vitals:   12/24/22 1427  BP: 99/67  Pulse: 92  Weight: 263 lb (119.3 kg)  Height: 5\' 9"  (1.753 m)   Body mass index is 38.84 kg/m.  Generalized: Well developed, in no acute distress  Neurological examination  Mentation: Alert oriented to time, place, history taking. Follows all commands speech and language fluent Cranial nerve II-XII: Pupils were equal round reactive to light. Extraocular movements were full, visual field were full on confrontational test. Facial sensation and strength were normal. Head turning and shoulder shrug  were normal and symmetric. Motor: The motor testing reveals 5 over 5 strength of all 4 extremities. Good symmetric motor tone is noted throughout.  Sensory: Length dependent sensory deficit lower extremities to mid shin area, wearing compression stockings, Velcro wraps  Coordination: Cerebellar testing reveals good finger-nose-finger and heel-to-shin bilaterally.  Gait and station: Gait is antalgic, no assistive device,  Reflexes: Deep tendon reflexes  are symmetric and normal bilaterally.   DIAGNOSTIC DATA (LABS, IMAGING, TESTING) - I reviewed patient records, labs, notes, testing and imaging myself where available.  Lab Results  Component Value Date   WBC 6.7 11/17/2020   HGB 12.2 (L) 11/17/2020   HCT 35.4 (L) 11/17/2020   MCV 96 11/17/2020   PLT 198 11/17/2020      Component Value Date/Time   NA 140 11/17/2020 1303   K 4.7 11/17/2020 1303   CL 102 11/17/2020 1303   CO2 25 11/17/2020 1303   GLUCOSE 123 (H) 11/17/2020 1303   GLUCOSE 111 (H) 10/16/2018 0426   BUN 19 11/17/2020 1303   CREATININE 1.06 11/17/2020 1303   CALCIUM 8.9 11/17/2020 1303   PROT 6.7 11/17/2020 1303   ALBUMIN 4.1 11/17/2020 1303   AST 45 (H) 11/17/2020 1303   ALT 31 11/17/2020 1303   ALKPHOS 113 11/17/2020 1303   BILITOT 0.4 11/17/2020 1303   GFRNONAA 70 09/14/2019 1523   GFRAA 81 09/14/2019 1523   No results found for: "CHOL", "HDL", "LDLCALC", "LDLDIRECT", "TRIG", "CHOLHDL" Lab Results  Component Value Date   HGBA1C 6.6 (H) 09/14/2019   Lab Results  Component Value Date   VITAMINB12 477 04/08/2017   No results found for: "TSH"  Margie Ege, AGNP-C, DNP 12/24/2022, 2:48 PM [facility] [address], [address] [place], [place] [address] [phone]

## 2022-12-24 ENCOUNTER — Encounter: Payer: Self-pay | Admitting: Neurology

## 2022-12-24 ENCOUNTER — Ambulatory Visit: Payer: Medicare PPO | Admitting: Neurology

## 2022-12-24 VITALS — BP 99/67 | HR 92 | Ht 69.0 in | Wt 263.0 lb

## 2022-12-24 DIAGNOSIS — G2581 Restless legs syndrome: Secondary | ICD-10-CM | POA: Diagnosis not present

## 2022-12-24 DIAGNOSIS — M545 Low back pain, unspecified: Secondary | ICD-10-CM | POA: Diagnosis not present

## 2022-12-24 DIAGNOSIS — G603 Idiopathic progressive neuropathy: Secondary | ICD-10-CM | POA: Diagnosis not present

## 2022-12-24 DIAGNOSIS — G8929 Other chronic pain: Secondary | ICD-10-CM | POA: Diagnosis not present

## 2022-12-24 MED ORDER — ROPINIROLE HCL 2 MG PO TABS: ORAL_TABLET | ORAL | 5 refills | Status: DC

## 2022-12-24 MED ORDER — NEUPRO 4 MG/24HR TD PT24: MEDICATED_PATCH | TRANSDERMAL | 5 refills | Status: DC

## 2022-12-24 NOTE — Patient Instructions (Signed)
We will continue current medications from our office.  Referral to [facility] to see if they can help with better management of neuropathy.  When she get back to using CPAP consistently for 4 to 6 weeks let me know I can pull a download.

## 2022-12-26 ENCOUNTER — Telehealth: Payer: Self-pay | Admitting: Neurology

## 2022-12-26 DIAGNOSIS — R52 Pain, unspecified: Secondary | ICD-10-CM | POA: Diagnosis not present

## 2022-12-26 DIAGNOSIS — M4302 Spondylolysis, cervical region: Secondary | ICD-10-CM | POA: Diagnosis not present

## 2022-12-26 DIAGNOSIS — M5412 Radiculopathy, cervical region: Secondary | ICD-10-CM | POA: Diagnosis not present

## 2022-12-26 DIAGNOSIS — I890 Lymphedema, not elsewhere classified: Secondary | ICD-10-CM | POA: Diagnosis not present

## 2022-12-26 DIAGNOSIS — M542 Cervicalgia: Secondary | ICD-10-CM | POA: Diagnosis not present

## 2022-12-26 NOTE — Telephone Encounter (Signed)
He is scheduled for 01/03/23 at 10:45am with America Brown.

## 2022-12-26 NOTE — Telephone Encounter (Signed)
Referral for pain clinic fax to [facility]. Phone: [phone], Fax: [phone]

## 2023-01-03 DIAGNOSIS — M47816 Spondylosis without myelopathy or radiculopathy, lumbar region: Secondary | ICD-10-CM | POA: Diagnosis not present

## 2023-01-03 DIAGNOSIS — M542 Cervicalgia: Secondary | ICD-10-CM | POA: Diagnosis not present

## 2023-01-03 DIAGNOSIS — M47812 Spondylosis without myelopathy or radiculopathy, cervical region: Secondary | ICD-10-CM | POA: Diagnosis not present

## 2023-01-03 DIAGNOSIS — Z79891 Long term (current) use of opiate analgesic: Secondary | ICD-10-CM | POA: Diagnosis not present

## 2023-01-03 DIAGNOSIS — M792 Neuralgia and neuritis, unspecified: Secondary | ICD-10-CM | POA: Diagnosis not present

## 2023-01-03 DIAGNOSIS — G894 Chronic pain syndrome: Secondary | ICD-10-CM | POA: Diagnosis not present

## 2023-01-03 DIAGNOSIS — M545 Low back pain, unspecified: Secondary | ICD-10-CM | POA: Diagnosis not present

## 2023-01-09 DIAGNOSIS — R52 Pain, unspecified: Secondary | ICD-10-CM | POA: Diagnosis not present

## 2023-01-09 DIAGNOSIS — I890 Lymphedema, not elsewhere classified: Secondary | ICD-10-CM | POA: Diagnosis not present

## 2023-01-10 DIAGNOSIS — M47812 Spondylosis without myelopathy or radiculopathy, cervical region: Secondary | ICD-10-CM | POA: Diagnosis not present

## 2023-01-15 DIAGNOSIS — M4302 Spondylolysis, cervical region: Secondary | ICD-10-CM | POA: Diagnosis not present

## 2023-01-15 DIAGNOSIS — M5412 Radiculopathy, cervical region: Secondary | ICD-10-CM | POA: Diagnosis not present

## 2023-01-15 DIAGNOSIS — M542 Cervicalgia: Secondary | ICD-10-CM | POA: Diagnosis not present

## 2023-01-16 DIAGNOSIS — I890 Lymphedema, not elsewhere classified: Secondary | ICD-10-CM | POA: Diagnosis not present

## 2023-01-16 DIAGNOSIS — R52 Pain, unspecified: Secondary | ICD-10-CM | POA: Diagnosis not present

## 2023-01-23 DIAGNOSIS — I890 Lymphedema, not elsewhere classified: Secondary | ICD-10-CM | POA: Diagnosis not present

## 2023-01-23 DIAGNOSIS — R52 Pain, unspecified: Secondary | ICD-10-CM | POA: Diagnosis not present

## 2023-01-25 DIAGNOSIS — M542 Cervicalgia: Secondary | ICD-10-CM | POA: Diagnosis not present

## 2023-01-25 DIAGNOSIS — I890 Lymphedema, not elsewhere classified: Secondary | ICD-10-CM | POA: Diagnosis not present

## 2023-01-25 DIAGNOSIS — M5412 Radiculopathy, cervical region: Secondary | ICD-10-CM | POA: Diagnosis not present

## 2023-01-25 DIAGNOSIS — M4302 Spondylolysis, cervical region: Secondary | ICD-10-CM | POA: Diagnosis not present

## 2023-01-31 DIAGNOSIS — M792 Neuralgia and neuritis, unspecified: Secondary | ICD-10-CM | POA: Diagnosis not present

## 2023-01-31 DIAGNOSIS — M545 Low back pain, unspecified: Secondary | ICD-10-CM | POA: Diagnosis not present

## 2023-01-31 DIAGNOSIS — M47812 Spondylosis without myelopathy or radiculopathy, cervical region: Secondary | ICD-10-CM | POA: Diagnosis not present

## 2023-01-31 DIAGNOSIS — M47816 Spondylosis without myelopathy or radiculopathy, lumbar region: Secondary | ICD-10-CM | POA: Diagnosis not present

## 2023-01-31 DIAGNOSIS — Z79891 Long term (current) use of opiate analgesic: Secondary | ICD-10-CM | POA: Diagnosis not present

## 2023-01-31 DIAGNOSIS — M542 Cervicalgia: Secondary | ICD-10-CM | POA: Diagnosis not present

## 2023-01-31 DIAGNOSIS — G894 Chronic pain syndrome: Secondary | ICD-10-CM | POA: Diagnosis not present

## 2023-02-05 DIAGNOSIS — M40209 Unspecified kyphosis, site unspecified: Secondary | ICD-10-CM | POA: Diagnosis not present

## 2023-02-05 DIAGNOSIS — M47816 Spondylosis without myelopathy or radiculopathy, lumbar region: Secondary | ICD-10-CM | POA: Diagnosis not present

## 2023-02-05 DIAGNOSIS — S299XXA Unspecified injury of thorax, initial encounter: Secondary | ICD-10-CM | POA: Diagnosis not present

## 2023-02-05 DIAGNOSIS — M792 Neuralgia and neuritis, unspecified: Secondary | ICD-10-CM | POA: Diagnosis not present

## 2023-02-05 DIAGNOSIS — S3992XA Unspecified injury of lower back, initial encounter: Secondary | ICD-10-CM | POA: Diagnosis not present

## 2023-02-07 DIAGNOSIS — E8770 Fluid overload, unspecified: Secondary | ICD-10-CM | POA: Diagnosis not present

## 2023-02-07 DIAGNOSIS — I4819 Other persistent atrial fibrillation: Secondary | ICD-10-CM | POA: Diagnosis not present

## 2023-02-07 DIAGNOSIS — I5022 Chronic systolic (congestive) heart failure: Secondary | ICD-10-CM | POA: Diagnosis not present

## 2023-02-12 DIAGNOSIS — I10 Essential (primary) hypertension: Secondary | ICD-10-CM | POA: Diagnosis not present

## 2023-02-12 DIAGNOSIS — E1165 Type 2 diabetes mellitus with hyperglycemia: Secondary | ICD-10-CM | POA: Diagnosis not present

## 2023-02-12 DIAGNOSIS — Z125 Encounter for screening for malignant neoplasm of prostate: Secondary | ICD-10-CM | POA: Diagnosis not present

## 2023-02-12 DIAGNOSIS — I509 Heart failure, unspecified: Secondary | ICD-10-CM | POA: Diagnosis not present

## 2023-02-12 DIAGNOSIS — Z23 Encounter for immunization: Secondary | ICD-10-CM | POA: Diagnosis not present

## 2023-02-12 DIAGNOSIS — Z299 Encounter for prophylactic measures, unspecified: Secondary | ICD-10-CM | POA: Diagnosis not present

## 2023-02-12 DIAGNOSIS — Z0000 Encounter for general adult medical examination without abnormal findings: Secondary | ICD-10-CM | POA: Diagnosis not present

## 2023-02-12 DIAGNOSIS — E7800 Pure hypercholesterolemia, unspecified: Secondary | ICD-10-CM | POA: Diagnosis not present

## 2023-02-12 DIAGNOSIS — R5383 Other fatigue: Secondary | ICD-10-CM | POA: Diagnosis not present

## 2023-02-12 DIAGNOSIS — Z79899 Other long term (current) drug therapy: Secondary | ICD-10-CM | POA: Diagnosis not present

## 2023-02-13 DIAGNOSIS — M47812 Spondylosis without myelopathy or radiculopathy, cervical region: Secondary | ICD-10-CM | POA: Diagnosis not present

## 2023-02-15 DIAGNOSIS — M542 Cervicalgia: Secondary | ICD-10-CM | POA: Diagnosis not present

## 2023-02-15 DIAGNOSIS — M4302 Spondylolysis, cervical region: Secondary | ICD-10-CM | POA: Diagnosis not present

## 2023-02-15 DIAGNOSIS — M5412 Radiculopathy, cervical region: Secondary | ICD-10-CM | POA: Diagnosis not present

## 2023-02-21 ENCOUNTER — Ambulatory Visit (INDEPENDENT_AMBULATORY_CARE_PROVIDER_SITE_OTHER): Payer: Medicare PPO | Admitting: Podiatry

## 2023-02-21 DIAGNOSIS — Z91199 Patient's noncompliance with other medical treatment and regimen due to unspecified reason: Secondary | ICD-10-CM

## 2023-02-21 NOTE — Progress Notes (Signed)
 Patient absent for apointment

## 2023-02-22 DIAGNOSIS — I4819 Other persistent atrial fibrillation: Secondary | ICD-10-CM | POA: Diagnosis not present

## 2023-02-22 DIAGNOSIS — I209 Angina pectoris, unspecified: Secondary | ICD-10-CM | POA: Diagnosis not present

## 2023-02-22 DIAGNOSIS — I5022 Chronic systolic (congestive) heart failure: Secondary | ICD-10-CM | POA: Diagnosis not present

## 2023-02-24 DIAGNOSIS — I5022 Chronic systolic (congestive) heart failure: Secondary | ICD-10-CM | POA: Diagnosis not present

## 2023-02-24 DIAGNOSIS — I4819 Other persistent atrial fibrillation: Secondary | ICD-10-CM | POA: Diagnosis not present

## 2023-02-25 DIAGNOSIS — M5412 Radiculopathy, cervical region: Secondary | ICD-10-CM | POA: Diagnosis not present

## 2023-02-25 DIAGNOSIS — M542 Cervicalgia: Secondary | ICD-10-CM | POA: Diagnosis not present

## 2023-02-25 DIAGNOSIS — M4302 Spondylolysis, cervical region: Secondary | ICD-10-CM | POA: Diagnosis not present

## 2023-03-06 DIAGNOSIS — J069 Acute upper respiratory infection, unspecified: Secondary | ICD-10-CM | POA: Diagnosis not present

## 2023-03-06 DIAGNOSIS — I509 Heart failure, unspecified: Secondary | ICD-10-CM | POA: Diagnosis not present

## 2023-03-06 DIAGNOSIS — R059 Cough, unspecified: Secondary | ICD-10-CM | POA: Diagnosis not present

## 2023-03-06 DIAGNOSIS — I10 Essential (primary) hypertension: Secondary | ICD-10-CM | POA: Diagnosis not present

## 2023-03-06 DIAGNOSIS — Z299 Encounter for prophylactic measures, unspecified: Secondary | ICD-10-CM | POA: Diagnosis not present

## 2023-03-11 ENCOUNTER — Other Ambulatory Visit: Payer: Self-pay

## 2023-03-12 ENCOUNTER — Other Ambulatory Visit: Payer: Self-pay

## 2023-03-12 NOTE — Telephone Encounter (Signed)
Requested Prescriptions   Pending Prescriptions Disp Refills   traMADol (ULTRAM) 50 MG tablet 30 tablet 0    Sig: Take 1 tablet (50 mg total) by mouth every 6 (six) hours as needed.   Last seen 12/24/22, next appt 07/10/23 Dispenses    Dispensed Days Supply Quantity Provider Pharmacy  tramadol 50 mg tablet 02/02/2023 30 30 each Janeece Riggers, FNP [facility] [place], ...  tramadol 50 mg tablet 01/03/2023 30 30 each Salmon, Joyce Copa, FNP [facility] [place], ...  tramadol 50 mg tablet 10/25/2022 30 30 each Sakla, Freddi Starr, DO [facility] [place], .Marland KitchenMarland Kitchen

## 2023-03-13 DIAGNOSIS — I509 Heart failure, unspecified: Secondary | ICD-10-CM | POA: Diagnosis not present

## 2023-03-13 DIAGNOSIS — I10 Essential (primary) hypertension: Secondary | ICD-10-CM | POA: Diagnosis not present

## 2023-03-13 DIAGNOSIS — Z299 Encounter for prophylactic measures, unspecified: Secondary | ICD-10-CM | POA: Diagnosis not present

## 2023-03-13 DIAGNOSIS — J069 Acute upper respiratory infection, unspecified: Secondary | ICD-10-CM | POA: Diagnosis not present

## 2023-03-15 DIAGNOSIS — I25119 Atherosclerotic heart disease of native coronary artery with unspecified angina pectoris: Secondary | ICD-10-CM | POA: Diagnosis not present

## 2023-03-15 DIAGNOSIS — Z66 Do not resuscitate: Secondary | ICD-10-CM | POA: Diagnosis not present

## 2023-03-15 DIAGNOSIS — I130 Hypertensive heart and chronic kidney disease with heart failure and stage 1 through stage 4 chronic kidney disease, or unspecified chronic kidney disease: Secondary | ICD-10-CM | POA: Diagnosis not present

## 2023-03-15 DIAGNOSIS — R0789 Other chest pain: Secondary | ICD-10-CM | POA: Diagnosis not present

## 2023-03-15 DIAGNOSIS — Z87891 Personal history of nicotine dependence: Secondary | ICD-10-CM | POA: Diagnosis not present

## 2023-03-15 DIAGNOSIS — I4819 Other persistent atrial fibrillation: Secondary | ICD-10-CM | POA: Diagnosis not present

## 2023-03-15 DIAGNOSIS — E1122 Type 2 diabetes mellitus with diabetic chronic kidney disease: Secondary | ICD-10-CM | POA: Diagnosis not present

## 2023-03-15 DIAGNOSIS — E785 Hyperlipidemia, unspecified: Secondary | ICD-10-CM | POA: Diagnosis not present

## 2023-03-15 DIAGNOSIS — I509 Heart failure, unspecified: Secondary | ICD-10-CM | POA: Diagnosis not present

## 2023-03-17 DIAGNOSIS — M5412 Radiculopathy, cervical region: Secondary | ICD-10-CM | POA: Diagnosis not present

## 2023-03-17 DIAGNOSIS — M542 Cervicalgia: Secondary | ICD-10-CM | POA: Diagnosis not present

## 2023-03-17 DIAGNOSIS — M4302 Spondylolysis, cervical region: Secondary | ICD-10-CM | POA: Diagnosis not present

## 2023-03-18 ENCOUNTER — Other Ambulatory Visit: Payer: Self-pay

## 2023-03-19 ENCOUNTER — Other Ambulatory Visit: Payer: Self-pay

## 2023-03-19 NOTE — Telephone Encounter (Signed)
Requested Prescriptions   Pending Prescriptions Disp Refills   traMADol (ULTRAM) 50 MG tablet 30 tablet 0    Sig: Take 1 tablet (50 mg total) by mouth every 6 (six) hours as needed.   Last seen 12/24/22 Next appt 07/10/23 Dispenses   Dispensed Days Supply Quantity Provider Pharmacy  tramadol 50 mg tablet 02/02/2023 30 30 each Janeece Riggers, FNP [facility] [place], ...  tramadol 50 mg tablet 01/03/2023 30 30 each Salmon, Joyce Copa, FNP [facility] [place], ...  tramadol 50 mg tablet 10/25/2022 30 30 each Sakla, Freddi Starr, DO [facility] [place], .Marland KitchenMarland Kitchen

## 2023-03-27 DIAGNOSIS — M542 Cervicalgia: Secondary | ICD-10-CM | POA: Diagnosis not present

## 2023-03-27 DIAGNOSIS — M4302 Spondylolysis, cervical region: Secondary | ICD-10-CM | POA: Diagnosis not present

## 2023-03-27 DIAGNOSIS — M5412 Radiculopathy, cervical region: Secondary | ICD-10-CM | POA: Diagnosis not present

## 2023-04-16 DIAGNOSIS — Z79891 Long term (current) use of opiate analgesic: Secondary | ICD-10-CM | POA: Diagnosis not present

## 2023-04-16 DIAGNOSIS — M47816 Spondylosis without myelopathy or radiculopathy, lumbar region: Secondary | ICD-10-CM | POA: Diagnosis not present

## 2023-04-16 DIAGNOSIS — M542 Cervicalgia: Secondary | ICD-10-CM | POA: Diagnosis not present

## 2023-04-16 DIAGNOSIS — G894 Chronic pain syndrome: Secondary | ICD-10-CM | POA: Diagnosis not present

## 2023-04-16 DIAGNOSIS — M792 Neuralgia and neuritis, unspecified: Secondary | ICD-10-CM | POA: Diagnosis not present

## 2023-04-16 DIAGNOSIS — M545 Low back pain, unspecified: Secondary | ICD-10-CM | POA: Diagnosis not present

## 2023-04-16 DIAGNOSIS — M47812 Spondylosis without myelopathy or radiculopathy, cervical region: Secondary | ICD-10-CM | POA: Diagnosis not present

## 2023-04-17 DIAGNOSIS — M4302 Spondylolysis, cervical region: Secondary | ICD-10-CM | POA: Diagnosis not present

## 2023-04-17 DIAGNOSIS — M5412 Radiculopathy, cervical region: Secondary | ICD-10-CM | POA: Diagnosis not present

## 2023-04-17 DIAGNOSIS — M542 Cervicalgia: Secondary | ICD-10-CM | POA: Diagnosis not present

## 2023-04-27 DIAGNOSIS — M542 Cervicalgia: Secondary | ICD-10-CM | POA: Diagnosis not present

## 2023-04-27 DIAGNOSIS — M4302 Spondylolysis, cervical region: Secondary | ICD-10-CM | POA: Diagnosis not present

## 2023-04-27 DIAGNOSIS — M5412 Radiculopathy, cervical region: Secondary | ICD-10-CM | POA: Diagnosis not present

## 2023-05-14 DIAGNOSIS — M542 Cervicalgia: Secondary | ICD-10-CM | POA: Diagnosis not present

## 2023-05-14 DIAGNOSIS — M545 Low back pain, unspecified: Secondary | ICD-10-CM | POA: Diagnosis not present

## 2023-05-14 DIAGNOSIS — M792 Neuralgia and neuritis, unspecified: Secondary | ICD-10-CM | POA: Diagnosis not present

## 2023-05-14 DIAGNOSIS — M47812 Spondylosis without myelopathy or radiculopathy, cervical region: Secondary | ICD-10-CM | POA: Diagnosis not present

## 2023-05-14 DIAGNOSIS — G894 Chronic pain syndrome: Secondary | ICD-10-CM | POA: Diagnosis not present

## 2023-05-14 DIAGNOSIS — M47816 Spondylosis without myelopathy or radiculopathy, lumbar region: Secondary | ICD-10-CM | POA: Diagnosis not present

## 2023-05-14 DIAGNOSIS — Z79891 Long term (current) use of opiate analgesic: Secondary | ICD-10-CM | POA: Diagnosis not present

## 2023-05-18 DIAGNOSIS — M542 Cervicalgia: Secondary | ICD-10-CM | POA: Diagnosis not present

## 2023-05-18 DIAGNOSIS — M5412 Radiculopathy, cervical region: Secondary | ICD-10-CM | POA: Diagnosis not present

## 2023-05-18 DIAGNOSIS — M4302 Spondylolysis, cervical region: Secondary | ICD-10-CM | POA: Diagnosis not present

## 2023-05-22 ENCOUNTER — Other Ambulatory Visit: Payer: Self-pay | Admitting: Neurology

## 2023-05-28 DIAGNOSIS — M5412 Radiculopathy, cervical region: Secondary | ICD-10-CM | POA: Diagnosis not present

## 2023-05-28 DIAGNOSIS — M4302 Spondylolysis, cervical region: Secondary | ICD-10-CM | POA: Diagnosis not present

## 2023-05-28 DIAGNOSIS — M542 Cervicalgia: Secondary | ICD-10-CM | POA: Diagnosis not present

## 2023-05-30 DIAGNOSIS — M5416 Radiculopathy, lumbar region: Secondary | ICD-10-CM | POA: Diagnosis not present

## 2023-05-30 DIAGNOSIS — G894 Chronic pain syndrome: Secondary | ICD-10-CM | POA: Diagnosis not present

## 2023-06-03 ENCOUNTER — Other Ambulatory Visit: Payer: Self-pay | Admitting: Neurology

## 2023-06-06 DIAGNOSIS — M792 Neuralgia and neuritis, unspecified: Secondary | ICD-10-CM | POA: Diagnosis not present

## 2023-06-06 DIAGNOSIS — G894 Chronic pain syndrome: Secondary | ICD-10-CM | POA: Diagnosis not present

## 2023-06-06 DIAGNOSIS — M5416 Radiculopathy, lumbar region: Secondary | ICD-10-CM | POA: Diagnosis not present

## 2023-06-17 DIAGNOSIS — M5412 Radiculopathy, cervical region: Secondary | ICD-10-CM | POA: Diagnosis not present

## 2023-06-17 DIAGNOSIS — Z299 Encounter for prophylactic measures, unspecified: Secondary | ICD-10-CM | POA: Diagnosis not present

## 2023-06-17 DIAGNOSIS — I4891 Unspecified atrial fibrillation: Secondary | ICD-10-CM | POA: Diagnosis not present

## 2023-06-17 DIAGNOSIS — E1165 Type 2 diabetes mellitus with hyperglycemia: Secondary | ICD-10-CM | POA: Diagnosis not present

## 2023-06-17 DIAGNOSIS — I10 Essential (primary) hypertension: Secondary | ICD-10-CM | POA: Diagnosis not present

## 2023-06-17 DIAGNOSIS — I509 Heart failure, unspecified: Secondary | ICD-10-CM | POA: Diagnosis not present

## 2023-06-17 DIAGNOSIS — F0390 Unspecified dementia without behavioral disturbance: Secondary | ICD-10-CM | POA: Diagnosis not present

## 2023-06-17 DIAGNOSIS — M542 Cervicalgia: Secondary | ICD-10-CM | POA: Diagnosis not present

## 2023-06-17 DIAGNOSIS — M4302 Spondylolysis, cervical region: Secondary | ICD-10-CM | POA: Diagnosis not present

## 2023-06-24 DIAGNOSIS — M545 Low back pain, unspecified: Secondary | ICD-10-CM | POA: Diagnosis not present

## 2023-06-27 DIAGNOSIS — M542 Cervicalgia: Secondary | ICD-10-CM | POA: Diagnosis not present

## 2023-06-27 DIAGNOSIS — M5412 Radiculopathy, cervical region: Secondary | ICD-10-CM | POA: Diagnosis not present

## 2023-06-27 DIAGNOSIS — M4302 Spondylolysis, cervical region: Secondary | ICD-10-CM | POA: Diagnosis not present

## 2023-07-03 DIAGNOSIS — M47812 Spondylosis without myelopathy or radiculopathy, cervical region: Secondary | ICD-10-CM | POA: Diagnosis not present

## 2023-07-09 NOTE — Progress Notes (Deleted)
 Patient: Jason [name] Date of Birth: 07/03/1945  Reason for Visit: Follow up for neuropathy, RLS History from: Patient, wife  Primary Neurologist: Dr. Terrace Arabia  ASSESSMENT AND PLAN 78 y.o. year old male  With 1.  Small fiber neuropathy, chronic pain 2.  Restless leg syndrome 3.  Chronic low back pain 4.  Severe OSA -Continue at [facility] for low back pain, will send a referral over to see if they can assist with neuropathy pain management -For now, continue Lyrica 200 mg 3 times daily -Continue Cymbalta 60 mg daily -Continue Neupro patch 4 mg/24 hours, uses Requip rarely for breakthrough pain -Reportedly has seen Dr. Maisie Fus neurosurgery for low back pain, has not been felt a surgical candidate -Not using CPAP, have encouraged to restart, once he is using again, let me know, I can pull data to ensure treatment is adequate -Follow-up in 6 months or sooner if needed  HISTORY OF PRESENT ILLNESS: Today 07/09/23   12/24/22 SS: Has been seeing [facility]. Had radiofrequency ablation to lumbar spine last week, will also being having cervical ablation. Has had ESI with them, has done PT. Very pleased with pain center. Wearing compression stocking velcro wraps for legs, has ordered boots, has been having lymphedema, developed sores to his legs. Went to wound center. Lymphedema for around 1 year. Doing OT. AFIB with cardioversion in May 2024. Remains on Cymbalta 60 mg BID, Lyrica 200 mg TID, Neupro is a life saver for him, has switched another insurance, not sure what the coverage will be. Rarely has to use Requip for breakthrough only if he forgets to change the patch. In general RLS under great control. His feet are numb, but they hurt and burn. Has not fallen, but uses a cane for safety all the time, balance is issue. Is diabetic now, on oral medication.   Update 01/31/22 SS: Jason [name] is here today for follow-up. Is prescribed Lyrica 200 mg 3 times daily, but only  takes 2 times daily. Has been taking mostly 3 times a day since increased pain to feet. Lyrica does help for sure. On Cymbalta 60 mg BID, Neupro patch 4 mg daily, takes Requip once a month, Tramadol PRN last filled in June. Has increased sharp pains to his feet from neuropathy. Has CPAP uses occasionally, I pulled download, don't see any data other than sporadic night or 2 in Sept. He bought 2 yorkie dogs, he loves them. He uses his medications sparingly. Uses cane if needed, 1 fall, no major. Has not seen Dr. Maisie Fus for follow-up. Has been told borderline diabetic.   Update 09/21/21  SS: Jason [name] is here today for follow-up.  Has remained off carbamazepine after a questionable medication overdose last year.  On Lyrica 200 mg 3 times a day (but has only been taking 2, pharmacy only give 2 a day). On Neupro, 4 mg patch, this really helps. On Cymbalta 60 mg BID. Has had worsening neuropathy pain, slowly worsening overtime, pain/burning sensation. Is worsening up to his knees. Saw Dr. Maisie Fus last year. Increased low back pain. If he cooks, leans forward, has to rest after due to neck pain. Very rarely takes Requip. Has tramadol to take PRN, is almost out. Most bothersome is the neuropathy. 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 Has been told he has sleep apnea, but doesn't use CPAP. Severe OSA back in July 2021.  HISTORY  03/23/2021 MM: Jason [name] is  a 78 year old male with a history of small fiber neuropathy and restless leg syndrome.  He returns today for follow-up.  The patient states that he was hospitalized several months ago for altered mental status.  Dr. Anne Hahn felt that it was medication overdose.  However the patient is now stating that his son counted his meds after the fact and he did not take any additional medication.  The patient has not restarted carbamazepine.  He is only been taking Lyrica 200 mg twice a day.  He states that since he has not restarted  carbamazepine and taking a lower dose of Lyrica he has sharp shooting pains in the legs.  It is not constant but does occur very frequently.  He is hoping to restart carbamazepine or increase Lyrica.  He continues on Neupro patch for restless legs.  He has Requip but takes it infrequently.  Only for breakthrough symptoms.  He returns today for an evaluation.  REVIEW OF SYSTEMS: Out of a complete 14 system review of symptoms, the patient complains only of the following symptoms, and all other reviewed systems are negative.  See HPI  ALLERGIES: Allergies  Allergen Reactions   Augmentin [Amoxicillin-Pot Clavulanate] Nausea Only    Did it involve swelling of the face/tongue/throat, SOB, or low BP? No Did it involve sudden or severe rash/hives, skin peeling, or any reaction on the inside of your mouth or nose? No Did you need to seek medical attention at a hospital or doctor's office? No When did it last happen?15-20 years ago If all above answers are "NO", may proceed with cephalosporin use.    Penicillins Nausea Only    Did it involve swelling of the face/tongue/throat, SOB, or low BP? No Did it involve sudden or severe rash/hives, skin peeling, or any reaction on the inside of your mouth or nose? No Did you need to seek medical attention at a hospital or doctor's office? No When did it last happen?10-15 years ago If all above answers are "NO", may proceed with cephalosporin use.     Clavulanic Acid     HOME MEDICATIONS: Outpatient Medications Prior to Visit  Medication Sig Dispense Refill   amiodarone (PACERONE) 200 MG tablet Take 1 tablet by mouth daily.     apixaban (ELIQUIS) 5 MG TABS tablet Take 5 mg by mouth 2 (two) times daily.     [employer] FIBER GUMMIES PO Take 2 tablets by mouth daily.     DULoxetine (CYMBALTA) 60 MG capsule Take 1 capsule (60 mg total) by mouth 2 (two) times daily. 180 capsule 3   lisinopril (ZESTRIL) 2.5 MG tablet      metoprolol tartrate (LOPRESSOR) 25 MG  tablet Take 25 mg by mouth every evening.      Multiple Vitamins-Minerals (ADULT GUMMY PO) Take 2 tablets by mouth daily.     pantoprazole (PROTONIX) 40 MG tablet Take 40 mg by mouth daily.     Polyethyl Glycol-Propyl Glycol 0.4-0.3 % SOLN Place 1 drop into both eyes 3 (three) times daily as needed (dry eyes).      pregabalin (LYRICA) 200 MG capsule Take 1 capsule (200 mg total) by mouth 3 (three) times daily. 90 capsule 3   promethazine (PHENERGAN) 25 MG tablet Take 12.5 mg by mouth every 6 (six) hours as needed for nausea.     rOPINIRole (REQUIP) 2 MG tablet TAKE 2 TABLETS BY MOUTH EVERY DAY AS NEEDED FOR BREAKTHROUGH PAIN 60 tablet 2   rosuvastatin (CRESTOR) 10 MG tablet Take 10 mg  by mouth daily.     rotigotine (NEUPRO) 4 MG/24HR APPLY 1 PATCH EVERY DAY TOPICALLY 90 patch 1   tamsulosin (FLOMAX) 0.4 MG CAPS capsule Take 0.4 mg by mouth daily.     traMADol (ULTRAM) 50 MG tablet Take 1 tablet (50 mg total) by mouth every 6 (six) hours as needed. 30 tablet 0   No facility-administered medications prior to visit.    PAST MEDICAL HISTORY: Past Medical History:  Diagnosis Date   Diabetes mellitus without complication (HCC)    GERD (gastroesophageal reflux disease)    History of anemia    Childhood   History of cataract    History of kidney stones    History of migraine    resolved   Hypertension    states under control with med., has been on med. x 5 yr.   Insomnia    Low blood pressure    x3 hospitalized once for it   Memory difficulty 05/09/2018   MRSA (methicillin resistant staph aureus) culture positive 09/03/2018   Paresthesia 04/27/2015   Peripheral neuropathy 11/06/2017   RLS (restless legs syndrome) 04/27/2015   Seasonal allergies    Sleep apnea    no CPAP use, REPORTS HE NEEDS A NEW ONE    Staph aureus infection 09/03/2018   TIA (transient ischemic attack)    Unilateral osteoarthritis of knee 12/2014   left    PAST SURGICAL HISTORY: Past Surgical History:  Procedure  Laterality Date   CARDIOVERSION N/A 08/31/2022   Procedure: CARDIOVERSION;  Surgeon: Jodelle Red, MD;  Location: [facility] INVASIVE CV LAB;  Service: Cardiovascular;  Laterality: N/A;   CATARACT EXTRACTION, BILATERAL     COLONOSCOPY     CYSTOSCOPY WITH URETEROSCOPY AND STENT PLACEMENT Bilateral 09/08/2018   Procedure: CYSTOSCOPY WITH BILATERAL  URETEROSCOPY HOLMIUM LASER AND STENT PLACEMENT;  Surgeon: Crista Elliot, MD;  Location: [facility];  Service: Urology;  Laterality: Bilateral;   LAPAROSCOPIC NEPHRECTOMY, HAND ASSISTED Right 10/15/2018   Procedure: HAND ASSISTED LAPAROSCOPIC NEPHRECTOMY;  Surgeon: Crista Elliot, MD;  Location: [facility];  Service: Urology;  Laterality: Right;   PARTIAL KNEE ARTHROPLASTY Right 06/02/2013   Procedure: RIGHT UNICOMPARTMENTAL KNEE;  Surgeon: Eulas Post, MD;  Location: [facility];  Service: Orthopedics;  Laterality: Right;   PARTIAL KNEE ARTHROPLASTY Left 12/17/2014   Procedure: LEFT KNEE UNI ARTHROPLASTY ;  Surgeon: Teryl Lucy, MD;  Location: [facility];  Service: Orthopedics;  Laterality: Left;   TONSILLECTOMY  1950's   UPPER GI ENDOSCOPY      FAMILY HISTORY: Family History  Problem Relation Age of Onset   Emphysema Father        Died age 71   COPD Father    Sudden death Mother        Died age 55   CAD Brother        Died age 69   Hypertension Brother     SOCIAL HISTORY: Social History   Socioeconomic History   Marital status: Married    Spouse name: Aurea Graff   Number of children: 2   Years of education: 14   Highest education level: Not on file  Occupational History   Occupation: self-employed  Tobacco Use   Smoking status: Former    Types: Pipe   Smokeless tobacco: Never   Tobacco comments:    06/02/2013   Vaping Use   Vaping status: Never Used  Substance and Sexual Activity   Alcohol use: Yes    Comment: occasionally  Drug use: No   Sexual activity: Yes  Other Topics Concern   Not on file  Social History  Narrative   Retiring from [employer].     Patient drinks 1 cup of caffeine daily.   Patient is right handed.    Social Drivers of Corporate investment banker Strain: Low Risk  (11/30/2020)   Received from [facility], [facility]   Overall Financial Resource Strain (CARDIA)    Difficulty of Paying Living Expenses: Not hard at all  Food Insecurity: No Food Insecurity (11/30/2020)   Received from [facility], [facility]   Hunger Vital Sign    Worried About Running Out of Food in the Last Year: Never true    Ran Out of Food in the Last Year: Never true  Transportation Needs: No Transportation Needs (11/30/2020)   Received from [facility], [facility]   [facility] - Transportation    Lack of Transportation (Medical): No    Lack of Transportation (Non-Medical): No  Physical Activity: Not on file  Stress: Not on file  Social Connections: Not on file  Intimate Partner Violence: Not on file   PHYSICAL EXAM  There were no vitals filed for this visit.  There is no height or weight on file to calculate BMI.  Generalized: Well developed, in no acute distress  Neurological examination  Mentation: Alert oriented to time, place, history taking. Follows all commands speech and language fluent Cranial nerve II-XII: Pupils were equal round reactive to light. Extraocular movements were full, visual field were full on confrontational test. Facial sensation and strength were normal. Head turning and shoulder shrug  were normal and symmetric. Motor: The motor testing reveals 5 over 5 strength of all 4 extremities. Good symmetric motor tone is noted throughout.  Sensory: Length dependent sensory deficit lower extremities to mid shin area, wearing compression stockings, Velcro wraps  Coordination: Cerebellar testing reveals good finger-nose-finger and heel-to-shin bilaterally.  Gait and station: Gait is antalgic, no assistive device,  Reflexes: Deep tendon reflexes are symmetric and  normal bilaterally.   DIAGNOSTIC DATA (LABS, IMAGING, TESTING) - I reviewed patient records, labs, notes, testing and imaging myself where available.  Lab Results  Component Value Date   WBC 6.7 11/17/2020   HGB 12.2 (L) 11/17/2020   HCT 35.4 (L) 11/17/2020   MCV 96 11/17/2020   PLT 198 11/17/2020      Component Value Date/Time   NA 140 11/17/2020 1303   K 4.7 11/17/2020 1303   CL 102 11/17/2020 1303   CO2 25 11/17/2020 1303   GLUCOSE 123 (H) 11/17/2020 1303   GLUCOSE 111 (H) 10/16/2018 0426   BUN 19 11/17/2020 1303   CREATININE 1.06 11/17/2020 1303   CALCIUM 8.9 11/17/2020 1303   PROT 6.7 11/17/2020 1303   ALBUMIN 4.1 11/17/2020 1303   AST 45 (H) 11/17/2020 1303   ALT 31 11/17/2020 1303   ALKPHOS 113 11/17/2020 1303   BILITOT 0.4 11/17/2020 1303   GFRNONAA 70 09/14/2019 1523   GFRAA 81 09/14/2019 1523   No results found for: "CHOL", "HDL", "LDLCALC", "LDLDIRECT", "TRIG", "CHOLHDL" Lab Results  Component Value Date   HGBA1C 6.6 (H) 09/14/2019   Lab Results  Component Value Date   VITAMINB12 477 04/08/2017   No results found for: "TSH"  Margie Ege, Edrick Oh, DNP 07/09/2023, 9:46 PM [facility] [address], [address] [place], [place] [address] [phone]

## 2023-07-10 ENCOUNTER — Ambulatory Visit: Payer: Medicare PPO | Admitting: Neurology

## 2023-07-10 ENCOUNTER — Encounter: Payer: Self-pay | Admitting: Neurology

## 2023-07-10 DIAGNOSIS — I890 Lymphedema, not elsewhere classified: Secondary | ICD-10-CM | POA: Diagnosis not present

## 2023-07-11 ENCOUNTER — Other Ambulatory Visit: Payer: Self-pay | Admitting: Orthopedic Surgery

## 2023-07-17 DIAGNOSIS — M47812 Spondylosis without myelopathy or radiculopathy, cervical region: Secondary | ICD-10-CM | POA: Diagnosis not present

## 2023-07-18 DIAGNOSIS — M542 Cervicalgia: Secondary | ICD-10-CM | POA: Diagnosis not present

## 2023-07-18 DIAGNOSIS — M5412 Radiculopathy, cervical region: Secondary | ICD-10-CM | POA: Diagnosis not present

## 2023-07-18 DIAGNOSIS — M4302 Spondylolysis, cervical region: Secondary | ICD-10-CM | POA: Diagnosis not present

## 2023-07-26 ENCOUNTER — Other Ambulatory Visit: Payer: Self-pay | Admitting: Neurology

## 2023-07-29 ENCOUNTER — Telehealth: Payer: Self-pay | Admitting: Neurology

## 2023-07-29 MED ORDER — PREGABALIN 200 MG PO CAPS: 200.0000 mg | ORAL_CAPSULE | Freq: Three times a day (TID) | ORAL | 0 refills | Status: DC

## 2023-07-29 NOTE — Telephone Encounter (Signed)
 Last seen 12/24/22, next appt 08/06/23 Dispenses   Dispensed Days Supply Quantity Provider Pharmacy  PREGABALIN  200 MG CAPSULE 04/05/2023 30  Yan, Yijun, MD [facility] Mail D...  pregabalin  200 mg capsule 04/05/2023 30 90 capsule Phebe Brasil, MD [facility] Ma...  PREGABALIN  200 MG CAPSULE 11/13/2022 30  Phebe Brasil, MD [facility] Mail D...  pregabalin  200 mg capsule 11/13/2022 30 90 capsule Phebe Brasil, MD [facility] Ma...  pregabalin  200 mg capsule 08/15/2022 30 90 each Phebe Brasil, MD [facility] [place] [facility], ...    Sarah from my understanding he should be in pain clinic now

## 2023-07-29 NOTE — Telephone Encounter (Signed)
 Meds ordered this encounter  Medications   pregabalin  (LYRICA ) 200 MG capsule    Sig: Take 1 capsule (200 mg total) by mouth 3 (three) times daily.    Dispense:  21 capsule    Refill:  0    Early refill, waiting on mail order

## 2023-07-29 NOTE — Telephone Encounter (Signed)
   Dispensed Days Supply Quantity Provider Pharmacy  pregabalin  200 mg capsule 07/29/2023 30 90 capsule Wess Hammed, NP [facility] Ma...  PREGABALIN  200 MG CAPSULE 04/05/2023 30  Phebe Brasil, MD [facility] Mail D...  pregabalin  200 mg capsule 04/05/2023 30 90 capsule Phebe Brasil, MD [facility] Ma...  PREGABALIN  200 MG CAPSULE 11/13/2022 30  Phebe Brasil, MD [facility] Mail D...  pregabalin  200 mg capsule 11/13/2022 30 90 capsule Phebe Brasil, MD [facility] Ma...  pregabalin  200 mg capsule 08/15/2022 30 90 each Phebe Brasil, MD [facility] [place] [facility], ...      Need bridge refill while waiting on mail order

## 2023-07-29 NOTE — Telephone Encounter (Signed)
 Daughter reports pt is out of the pregabalin  (LYRICA ) 200 MG capsule , while waiting on mail order she is asking if enough can be called in until appointment on 5-6, pt has been without this medication for a number of days.Requested pharmacy is [facility].

## 2023-07-30 ENCOUNTER — Encounter: Payer: Self-pay | Admitting: Neurology

## 2023-08-06 ENCOUNTER — Encounter: Payer: Self-pay | Admitting: Neurology

## 2023-08-06 ENCOUNTER — Ambulatory Visit: Admitting: Neurology

## 2023-08-06 ENCOUNTER — Ambulatory Visit
Admission: RE | Admit: 2023-08-06 | Discharge: 2023-08-06 | Disposition: A | Discharge status: Home / Self Care | Source: Ambulatory Visit | Attending: Neurology | Admitting: Neurology

## 2023-08-06 VITALS — BP 106/69 | HR 85 | Ht 69.0 in | Wt 233.0 lb

## 2023-08-06 DIAGNOSIS — R7989 Other specified abnormal findings of blood chemistry: Secondary | ICD-10-CM | POA: Diagnosis not present

## 2023-08-06 DIAGNOSIS — G603 Idiopathic progressive neuropathy: Secondary | ICD-10-CM

## 2023-08-06 DIAGNOSIS — G8929 Other chronic pain: Secondary | ICD-10-CM

## 2023-08-06 DIAGNOSIS — R202 Paresthesia of skin: Secondary | ICD-10-CM

## 2023-08-06 DIAGNOSIS — R519 Headache, unspecified: Secondary | ICD-10-CM | POA: Diagnosis not present

## 2023-08-06 DIAGNOSIS — M545 Low back pain, unspecified: Secondary | ICD-10-CM

## 2023-08-06 DIAGNOSIS — G2581 Restless legs syndrome: Secondary | ICD-10-CM | POA: Diagnosis not present

## 2023-08-06 DIAGNOSIS — E079 Disorder of thyroid, unspecified: Secondary | ICD-10-CM | POA: Diagnosis not present

## 2023-08-06 DIAGNOSIS — R799 Abnormal finding of blood chemistry, unspecified: Secondary | ICD-10-CM | POA: Diagnosis not present

## 2023-08-06 DIAGNOSIS — W19XXXA Unspecified fall, initial encounter: Secondary | ICD-10-CM | POA: Diagnosis not present

## 2023-08-06 DIAGNOSIS — R52 Pain, unspecified: Secondary | ICD-10-CM

## 2023-08-06 MED ORDER — PREGABALIN 200 MG PO CAPS: ORAL_CAPSULE | ORAL | 0 refills | Status: DC

## 2023-08-06 MED ORDER — PREGABALIN 150 MG PO CAPS
150.0000 mg | ORAL_CAPSULE | Freq: Three times a day (TID) | ORAL | 3 refills | Status: DC
Start: 2023-08-06 — End: 2023-08-15

## 2023-08-06 NOTE — Progress Notes (Addendum)
 Patient: Jason [name] Date of Birth: 1945-08-17  Reason for Visit: Follow up for neuropathy, RLS History from: Patient, wife, daughter, son on the phone Primary Neurologist: Dr. Gracie Lav  ASSESSMENT AND PLAN 78 y.o. year old male   1.  Small fiber neuropathy, chronic pain 2.  Restless leg syndrome 3.  Chronic low back pain 4.  Severe OSA  He was taking Lyrica  200 mg 3 times daily, then ran out for several weeks.  During that time it sounds as though he exhibited some withdrawal symptoms (falls, confusion, GI upset, poor appetite).  He has been back on Lyrica  for the last 6 days, he has been feeling some better, confusion is improving, gait is a little bit better.  He has had multiple falls, with strike to head.  Sounds as though there is concern from family for accidental medication non-adherence.  He is prescribed tramadol , Neupro , Requip , Cymbalta . Family is now managing medication, they would like to evaluate his medication list and consolidate.   -We decided to slowly reduce his Lyrica  from 200 mg 3 times a day to 150 3 times a day over 3 weeks. - Advised to discontinue Requip  use, uses a few times weekly for breakthrough RLS pain - Continue follow-up at [facility], is scheduled for spinal cord stimulator 08/29/2023 - Check CT head due to multiple falls with head injury - Check routine labs to rule out electrolyte abnormality contribution  - Resume CPAP use - Follow-up with me in 4 months  Meds ordered this encounter  Medications   pregabalin  (LYRICA ) 150 MG capsule    Sig: Take 1 capsule (150 mg total) by mouth 3 (three) times daily.    Dispense:  90 capsule    Refill:  3    This is newest dosing   pregabalin  (LYRICA ) 200 MG capsule    Sig: Wean over to 150 mg 3 times daily. Start taking 200 mg AM/150 mg midday/200 mg PM for 1 week, then 150 mg AM/150 mg midday/200 mg PM for 1 week, then 150 mg 3 times daily    Dispense:  21 capsule    Refill:  0   Orders Placed This  Encounter  Procedures   CT HEAD WO CONTRAST ( )   CBC with Differential/Platelet   CMP   TSH   IMPRESSION: CT head without contrast showing mild age-related changes of chronic small vessel disease and generalized cerebral atrophy.  No acute abnormalities.   HISTORY OF PRESENT ILLNESS: Today 08/06/23 Here with his wife and daughter. He missed his granddaughter's wedding. He ran out of Lyrica , 200 mg TID, had ran out for a few weeks, before he ran out, was only taking 1 a day. He was tired, laying in bed, like withdrawing from Lyrica , nausea. Has been back on medication 200 mg TID for 6 days, feels better but still confused, not much appetite. Last 2 days are better. Last week fell several times, poor balance. Son on the phone, mentions issues with medications, confusion with medication, daughter is now managing medications. Mentions opiate problem in the past. Continues with pain management, getting tramadol  and radiofrequency ablation. Surgery planned with Dr. Jackee Marus spinal cord stimulator 5/29. Follow up with [facility] next week. Not using CPAP. Taking Tramadol  up to twice daily. Takes Requip  4 mg as needed. Family feels he is overmedicated. Daughter doesn't think he is taking Eliquis.   12/24/22 SS: Has been seeing [facility]. Had radiofrequency ablation to lumbar  spine last week, will also being having cervical ablation. Has had ESI with them, has done PT. Very pleased with pain center. Wearing compression stocking velcro wraps for legs, has ordered boots, has been having lymphedema, developed sores to his legs. Went to wound center. Lymphedema for around 1 year. Doing OT. AFIB with cardioversion in May 2024. Remains on Cymbalta  60 mg BID, Lyrica  200 mg TID, Neupro  is a life saver for him, has switched another insurance, not sure what the coverage will be. Rarely has to use Requip  for breakthrough only if he forgets to change the patch. In general RLS under great control. His  feet are numb, but they hurt and burn. Has not fallen, but uses a cane for safety all the time, balance is issue. Is diabetic now, on oral medication.   Update 01/31/22 SS: Jason [name] is here today for follow-up. Is prescribed Lyrica  200 mg 3 times daily, but only takes 2 times daily. Has been taking mostly 3 times a day since increased pain to feet. Lyrica  does help for sure. On Cymbalta  60 mg BID, Neupro  patch 4 mg daily, takes Requip  once a month, Tramadol  PRN last filled in June. Has increased sharp pains to his feet from neuropathy. Has CPAP uses occasionally, I pulled download, don't see any data other than sporadic night or 2 in Sept. He bought 2 yorkie dogs, he loves them. He uses his medications sparingly. Uses cane if needed, 1 fall, no major. Has not seen Dr. Andy Bannister for follow-up. Has been told borderline diabetic.   Update 09/21/21  SS: Jason [name] is here today for follow-up.  Has remained off carbamazepine  after a questionable medication overdose last year.  On Lyrica  200 mg 3 times a day (but has only been taking 2, pharmacy only give 2 a day). On Neupro , 4 mg patch, this really helps. On Cymbalta  60 mg BID. Has had worsening neuropathy pain, slowly worsening overtime, pain/burning sensation. Is worsening up to his knees. Saw Dr. Andy Bannister last year. Increased low back pain. If he cooks, leans forward, has to rest after due to neck pain. Very rarely takes Requip . Has tramadol  to take PRN, is almost out. Most bothersome is the neuropathy. 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 Has been told he has sleep apnea, but doesn't use CPAP. Severe OSA back in July 2021.  HISTORY  03/23/2021 MM: Jason [name] is a 78 year old male with a history of small fiber neuropathy and restless leg syndrome.  He returns today for follow-up.  The patient states that he was hospitalized several months ago for altered mental status.  Dr. Tilda Fogo felt that it was medication overdose.  However  the patient is now stating that his son counted his meds after the fact and he did not take any additional medication.  The patient has not restarted carbamazepine .  He is only been taking Lyrica  200 mg twice a day.  He states that since he has not restarted carbamazepine  and taking a lower dose of Lyrica  he has sharp shooting pains in the legs.  It is not constant but does occur very frequently.  He is hoping to restart carbamazepine  or increase Lyrica .  He continues on Neupro  patch for restless legs.  He has Requip  but takes it infrequently.  Only for breakthrough symptoms.  He returns today for an evaluation.  REVIEW OF SYSTEMS: Out of a complete 14 system review of symptoms, the patient complains only of the following symptoms, and  all other reviewed systems are negative.  See HPI  ALLERGIES: Allergies  Allergen Reactions   Augmentin [Amoxicillin-Pot Clavulanate] Nausea Only    Did it involve swelling of the face/tongue/throat, SOB, or low BP? No Did it involve sudden or severe rash/hives, skin peeling, or any reaction on the inside of your mouth or nose? No Did you need to seek medical attention at a hospital or doctor's office? No When did it last happen?15-20 years ago If all above answers are "NO", may proceed with cephalosporin use.    Penicillins Nausea Only    Did it involve swelling of the face/tongue/throat, SOB, or low BP? No Did it involve sudden or severe rash/hives, skin peeling, or any reaction on the inside of your mouth or nose? No Did you need to seek medical attention at a hospital or doctor's office? No When did it last happen?10-15 years ago If all above answers are "NO", may proceed with cephalosporin use.     Clavulanic Acid     HOME MEDICATIONS: Outpatient Medications Prior to Visit  Medication Sig Dispense Refill   [employer] FIBER GUMMIES PO Take 2 tablets by mouth daily.     DULoxetine  (CYMBALTA ) 60 MG capsule Take 1 capsule (60 mg total) by mouth 2 (two) times  daily. 180 capsule 3   furosemide (LASIX) 20 MG tablet Take 20 mg by mouth daily.     lisinopril (ZESTRIL) 2.5 MG tablet      metoprolol  tartrate (LOPRESSOR ) 25 MG tablet Take 25 mg by mouth every evening.      Multiple Vitamins-Minerals (ADULT GUMMY PO) Take 2 tablets by mouth daily.     pantoprazole  (PROTONIX ) 40 MG tablet Take 40 mg by mouth daily.     Polyethyl Glycol-Propyl Glycol 0.4-0.3 % SOLN Place 1 drop into both eyes 3 (three) times daily as needed (dry eyes).      pregabalin  (LYRICA ) 200 MG capsule Take 1 capsule (200 mg total) by mouth 3 (three) times daily. 21 capsule 0   promethazine  (PHENERGAN ) 25 MG tablet Take 12.5 mg by mouth every 6 (six) hours as needed for nausea.     rOPINIRole  (REQUIP ) 2 MG tablet TAKE 2 TABLETS BY MOUTH EVERY DAY AS NEEDED FOR BREAKTHROUGH PAIN 60 tablet 2   rosuvastatin (CRESTOR) 10 MG tablet Take 10 mg by mouth daily.     rotigotine  (NEUPRO ) 4 MG/24HR APPLY 1 PATCH EVERY DAY TOPICALLY 90 patch 1   tamsulosin  (FLOMAX ) 0.4 MG CAPS capsule Take 0.4 mg by mouth daily.     traMADol  (ULTRAM ) 50 MG tablet Take 1 tablet (50 mg total) by mouth every 6 (six) hours as needed. 30 tablet 0   amiodarone (PACERONE) 200 MG tablet Take 1 tablet by mouth daily. (Patient not taking: Reported on 08/06/2023)     apixaban (ELIQUIS) 5 MG TABS tablet Take 5 mg by mouth 2 (two) times daily. (Patient not taking: Reported on 08/06/2023)     No facility-administered medications prior to visit.    PAST MEDICAL HISTORY: Past Medical History:  Diagnosis Date   Diabetes mellitus without complication (HCC)    GERD (gastroesophageal reflux disease)    History of anemia    Childhood   History of cataract    History of kidney stones    History of migraine    resolved   Hypertension    states under control with med., has been on med. x 5 yr.   Insomnia    Low blood pressure    x3  hospitalized once for it   Memory difficulty 05/09/2018   MRSA (methicillin resistant staph aureus)  culture positive 09/03/2018   Paresthesia 04/27/2015   Peripheral neuropathy 11/06/2017   RLS (restless legs syndrome) 04/27/2015   Seasonal allergies    Sleep apnea    no CPAP use, REPORTS HE NEEDS A NEW ONE    Staph aureus infection 09/03/2018   TIA (transient ischemic attack)    Unilateral osteoarthritis of knee 12/2014   left    PAST SURGICAL HISTORY: Past Surgical History:  Procedure Laterality Date   CARDIOVERSION N/A 08/31/2022   Procedure: CARDIOVERSION;  Surgeon: Sheryle Donning, MD;  Location: [facility] INVASIVE CV LAB;  Service: Cardiovascular;  Laterality: N/A;   CATARACT EXTRACTION, BILATERAL     COLONOSCOPY     CYSTOSCOPY WITH URETEROSCOPY AND STENT PLACEMENT Bilateral 09/08/2018   Procedure: CYSTOSCOPY WITH BILATERAL  URETEROSCOPY HOLMIUM LASER AND STENT PLACEMENT;  Surgeon: Samson Croak, MD;  Location: [facility];  Service: Urology;  Laterality: Bilateral;   LAPAROSCOPIC NEPHRECTOMY, HAND ASSISTED Right 10/15/2018   Procedure: HAND ASSISTED LAPAROSCOPIC NEPHRECTOMY;  Surgeon: Samson Croak, MD;  Location: [facility];  Service: Urology;  Laterality: Right;   PARTIAL KNEE ARTHROPLASTY Right 06/02/2013   Procedure: RIGHT UNICOMPARTMENTAL KNEE;  Surgeon: Neville Barbone, MD;  Location: [facility];  Service: Orthopedics;  Laterality: Right;   PARTIAL KNEE ARTHROPLASTY Left 12/17/2014   Procedure: LEFT KNEE UNI ARTHROPLASTY ;  Surgeon: Osa Blase, MD;  Location: [facility];  Service: Orthopedics;  Laterality: Left;   TONSILLECTOMY  1950's   UPPER GI ENDOSCOPY      FAMILY HISTORY: Family History  Problem Relation Age of Onset   Emphysema Father        Died age 58   COPD Father    Sudden death Mother        Died age 8   CAD Brother        Died age 67   Hypertension Brother     SOCIAL HISTORY: Social History   Socioeconomic History   Marital status: Married    Spouse name: Libby Ree   Number of children: 2   Years of education: 14   Highest education level:  Not on file  Occupational History   Occupation: self-employed  Tobacco Use   Smoking status: Former    Types: Pipe   Smokeless tobacco: Never   Tobacco comments:    06/02/2013   Vaping Use   Vaping status: Never Used  Substance and Sexual Activity   Alcohol  use: Yes    Comment: occasionally   Drug use: No   Sexual activity: Yes  Other Topics Concern   Not on file  Social History Narrative   Retiring from Product manager.     Patient drinks 1 cup of caffeine daily.   Patient is right handed.    Social Drivers of Corporate investment banker Strain: Low Risk  (11/30/2020)   Received from [facility], [facility]   Overall Financial Resource Strain (CARDIA)    Difficulty of Paying Living Expenses: Not hard at all  Food Insecurity: No Food Insecurity (11/30/2020)   Received from [facility], [facility]   Hunger Vital Sign    Worried About Running Out of Food in the Last Year: Never true    Ran Out of Food in the Last Year: Never true  Transportation Needs: No Transportation Needs (11/30/2020)   [facility]   PRAPARE - Transportation    Lack of Transportation (Medical): No    Lack of Transportation (Non-Medical): No  Physical Activity: Not on file  Stress: Not on file  Social Connections: Not on file  Intimate Partner Violence: Not on file   PHYSICAL EXAM  Vitals:   08/06/23 0837  BP: 106/69  Pulse: 85  Weight: 233 lb (105.7 kg)  Height: 5\' 9"  (1.753 m)   Body mass index is 34.41 kg/m.  Generalized: Well developed, in no acute distress  Neurological examination  Mentation: Alert oriented to time, place, history taking. Follows all commands speech and language fluent Cranial nerve II-XII: Pupils were equal round reactive to light. Extraocular movements were full, visual field were full on confrontational test. Facial sensation and strength were normal. Head turning and shoulder shrug  were normal and symmetric. Motor: The motor  testing reveals 5 over 5 strength of all 4 extremities. Good symmetric motor tone is noted throughout.  Sensory: Length dependent sensory deficit lower extremities to mid shin area, wearing compression stockings, Velcro wraps  Coordination: Cerebellar testing reveals good finger-nose-finger and heel-to-shin bilaterally. Mild tremor with finger to nose  Gait and station: Gait is antalgic, can walk short distance independent, otherwise has walker  Reflexes: Deep tendon reflexes are symmetric and normal bilaterally.   DIAGNOSTIC DATA (LABS, IMAGING, TESTING) - I reviewed patient records, labs, notes, testing and imaging myself where available.  Lab Results  Component Value Date   WBC 6.7 11/17/2020   HGB 12.2 (L) 11/17/2020   HCT 35.4 (L) 11/17/2020   MCV 96 11/17/2020   PLT 198 11/17/2020      Component Value Date/Time   NA 140 11/17/2020 1303   K 4.7 11/17/2020 1303   CL 102 11/17/2020 1303   CO2 25 11/17/2020 1303   GLUCOSE 123 (H) 11/17/2020 1303   GLUCOSE 111 (H) 10/16/2018 0426   BUN 19 11/17/2020 1303   CREATININE 1.06 11/17/2020 1303   CALCIUM 8.9 11/17/2020 1303   PROT 6.7 11/17/2020 1303   ALBUMIN 4.1 11/17/2020 1303   AST 45 (H) 11/17/2020 1303   ALT 31 11/17/2020 1303   ALKPHOS 113 11/17/2020 1303   BILITOT 0.4 11/17/2020 1303   GFRNONAA 70 09/14/2019 1523   GFRAA 81 09/14/2019 1523   No results found for: "CHOL", "HDL", "LDLCALC", "LDLDIRECT", "TRIG", "CHOLHDL" Lab Results  Component Value Date   HGBA1C 6.6 (H) 09/14/2019   Lab Results  Component Value Date   VITAMINB12 477 04/08/2017   No results found for: "TSH"  Jeanmarie Millet, AGNP-C, DNP 08/06/2023, 8:57 AM [facility] [address] [place], [place] [address] [phone]

## 2023-08-06 NOTE — Patient Instructions (Addendum)
 Reduce Lyrica  to 150 mg 3 times daily, start by reducing to 200/150/200 mg daily for 1 week then go to 150/150/200 mg daily for 1 week, then 150 mg 3 times daily.  Try not use the Requip , continue with Neupro   Continue follow up with pain management  Check labs  Go to [facility] today for CT Head   Need to get back on CPAP

## 2023-08-06 NOTE — Progress Notes (Signed)
 Chart reviewed, agree above plan ?

## 2023-08-07 ENCOUNTER — Telehealth: Payer: Self-pay | Admitting: Neurology

## 2023-08-07 DIAGNOSIS — E669 Obesity, unspecified: Secondary | ICD-10-CM | POA: Diagnosis not present

## 2023-08-07 DIAGNOSIS — Z66 Do not resuscitate: Secondary | ICD-10-CM | POA: Diagnosis not present

## 2023-08-07 DIAGNOSIS — E119 Type 2 diabetes mellitus without complications: Secondary | ICD-10-CM | POA: Diagnosis not present

## 2023-08-07 DIAGNOSIS — I10 Essential (primary) hypertension: Secondary | ICD-10-CM | POA: Diagnosis not present

## 2023-08-07 DIAGNOSIS — G4730 Sleep apnea, unspecified: Secondary | ICD-10-CM | POA: Diagnosis not present

## 2023-08-07 DIAGNOSIS — N400 Enlarged prostate without lower urinary tract symptoms: Secondary | ICD-10-CM | POA: Diagnosis not present

## 2023-08-07 DIAGNOSIS — E785 Hyperlipidemia, unspecified: Secondary | ICD-10-CM | POA: Diagnosis not present

## 2023-08-07 DIAGNOSIS — K219 Gastro-esophageal reflux disease without esophagitis: Secondary | ICD-10-CM | POA: Diagnosis not present

## 2023-08-07 DIAGNOSIS — G8929 Other chronic pain: Secondary | ICD-10-CM | POA: Diagnosis not present

## 2023-08-07 DIAGNOSIS — R630 Anorexia: Secondary | ICD-10-CM | POA: Diagnosis not present

## 2023-08-07 DIAGNOSIS — R531 Weakness: Secondary | ICD-10-CM | POA: Diagnosis not present

## 2023-08-07 DIAGNOSIS — E531 Pyridoxine deficiency: Secondary | ICD-10-CM | POA: Diagnosis not present

## 2023-08-07 DIAGNOSIS — Z7901 Long term (current) use of anticoagulants: Secondary | ICD-10-CM | POA: Diagnosis not present

## 2023-08-07 DIAGNOSIS — G2581 Restless legs syndrome: Secondary | ICD-10-CM | POA: Diagnosis not present

## 2023-08-07 DIAGNOSIS — I4891 Unspecified atrial fibrillation: Secondary | ICD-10-CM | POA: Diagnosis not present

## 2023-08-07 DIAGNOSIS — Z9989 Dependence on other enabling machines and devices: Secondary | ICD-10-CM | POA: Diagnosis not present

## 2023-08-07 DIAGNOSIS — N179 Acute kidney failure, unspecified: Secondary | ICD-10-CM | POA: Diagnosis not present

## 2023-08-07 LAB — COMPREHENSIVE METABOLIC PANEL WITH GFR
ALT: 16 IU/L (ref 0–44)
AST: 11 IU/L (ref 0–40)
Albumin: 4.3 g/dL (ref 3.8–4.8)
Alkaline Phosphatase: 109 IU/L (ref 44–121)
BUN/Creatinine Ratio: 13 (ref 10–24)
BUN: 83 mg/dL (ref 8–27)
Bilirubin Total: 0.6 mg/dL (ref 0.0–1.2)
CO2: 22 mmol/L (ref 20–29)
Calcium: 9.4 mg/dL (ref 8.6–10.2)
Chloride: 94 mmol/L — ABNORMAL LOW (ref 96–106)
Creatinine, Ser: 6.25 mg/dL — ABNORMAL HIGH (ref 0.76–1.27)
Globulin, Total: 2.9 g/dL (ref 1.5–4.5)
Glucose: 100 mg/dL — ABNORMAL HIGH (ref 70–99)
Potassium: 5.8 mmol/L — ABNORMAL HIGH (ref 3.5–5.2)
Sodium: 137 mmol/L (ref 134–144)
Total Protein: 7.2 g/dL (ref 6.0–8.5)
eGFR: 9 mL/min/{1.73_m2} — ABNORMAL LOW (ref >59)

## 2023-08-07 LAB — CBC WITH DIFFERENTIAL/PLATELET
Basophils Absolute: 0 10*3/uL (ref 0.0–0.2)
Basos: 1 %
EOS (ABSOLUTE): 0.3 10*3/uL (ref 0.0–0.4)
Eos: 4 %
Hematocrit: 40.3 % (ref 37.5–51.0)
Hemoglobin: 13.7 g/dL (ref 13.0–17.7)
Immature Grans (Abs): 0 10*3/uL (ref 0.0–0.1)
Immature Granulocytes: 0 %
Lymphocytes Absolute: 1.5 10*3/uL (ref 0.7–3.1)
Lymphs: 21 %
MCH: 32.4 pg (ref 26.6–33.0)
MCHC: 34 g/dL (ref 31.5–35.7)
MCV: 95 fL (ref 79–97)
Monocytes Absolute: 0.7 10*3/uL (ref 0.1–0.9)
Monocytes: 9 %
Neutrophils Absolute: 4.6 10*3/uL (ref 1.4–7.0)
Neutrophils: 65 %
Platelets: 201 10*3/uL (ref 150–450)
RBC: 4.23 x10E6/uL (ref 4.14–5.80)
RDW: 14.2 % (ref 11.6–15.4)
WBC: 7.2 10*3/uL (ref 3.4–10.8)

## 2023-08-07 LAB — TSH: TSH: 7.61 u[IU]/mL — ABNORMAL HIGH (ref 0.450–4.500)

## 2023-08-07 NOTE — Telephone Encounter (Signed)
 I called the patients daughter, creatinine returned very elevated at 6.25. Potassium 5.8, TSH 7.610. Advised to go to the ER for further work up for elevated creatinine. In the setting of falls, nausea, poor appetite.  Will be going to [facility], Can we send labs over to PCP. Thanks

## 2023-08-07 NOTE — Telephone Encounter (Signed)
 FAXED TO [phone]

## 2023-08-09 DIAGNOSIS — G8929 Other chronic pain: Secondary | ICD-10-CM | POA: Diagnosis not present

## 2023-08-09 DIAGNOSIS — I4891 Unspecified atrial fibrillation: Secondary | ICD-10-CM | POA: Diagnosis not present

## 2023-08-09 DIAGNOSIS — N179 Acute kidney failure, unspecified: Secondary | ICD-10-CM | POA: Diagnosis not present

## 2023-08-09 DIAGNOSIS — N400 Enlarged prostate without lower urinary tract symptoms: Secondary | ICD-10-CM | POA: Diagnosis not present

## 2023-08-09 DIAGNOSIS — G4730 Sleep apnea, unspecified: Secondary | ICD-10-CM | POA: Diagnosis not present

## 2023-08-09 DIAGNOSIS — G2581 Restless legs syndrome: Secondary | ICD-10-CM | POA: Diagnosis not present

## 2023-08-09 DIAGNOSIS — E669 Obesity, unspecified: Secondary | ICD-10-CM | POA: Diagnosis not present

## 2023-08-09 DIAGNOSIS — E119 Type 2 diabetes mellitus without complications: Secondary | ICD-10-CM | POA: Diagnosis not present

## 2023-08-09 DIAGNOSIS — R531 Weakness: Secondary | ICD-10-CM | POA: Diagnosis not present

## 2023-08-12 DIAGNOSIS — I5022 Chronic systolic (congestive) heart failure: Secondary | ICD-10-CM | POA: Diagnosis not present

## 2023-08-12 DIAGNOSIS — I4819 Other persistent atrial fibrillation: Secondary | ICD-10-CM | POA: Diagnosis not present

## 2023-08-12 DIAGNOSIS — G588 Other specified mononeuropathies: Secondary | ICD-10-CM | POA: Diagnosis not present

## 2023-08-14 DIAGNOSIS — M542 Cervicalgia: Secondary | ICD-10-CM | POA: Diagnosis not present

## 2023-08-14 DIAGNOSIS — M47812 Spondylosis without myelopathy or radiculopathy, cervical region: Secondary | ICD-10-CM | POA: Diagnosis not present

## 2023-08-14 DIAGNOSIS — M5416 Radiculopathy, lumbar region: Secondary | ICD-10-CM | POA: Diagnosis not present

## 2023-08-14 DIAGNOSIS — M5412 Radiculopathy, cervical region: Secondary | ICD-10-CM | POA: Diagnosis not present

## 2023-08-14 DIAGNOSIS — I4891 Unspecified atrial fibrillation: Secondary | ICD-10-CM | POA: Diagnosis not present

## 2023-08-14 DIAGNOSIS — M792 Neuralgia and neuritis, unspecified: Secondary | ICD-10-CM | POA: Diagnosis not present

## 2023-08-14 DIAGNOSIS — D638 Anemia in other chronic diseases classified elsewhere: Secondary | ICD-10-CM | POA: Diagnosis not present

## 2023-08-14 DIAGNOSIS — N182 Chronic kidney disease, stage 2 (mild): Secondary | ICD-10-CM | POA: Diagnosis not present

## 2023-08-14 DIAGNOSIS — N200 Calculus of kidney: Secondary | ICD-10-CM | POA: Diagnosis not present

## 2023-08-15 ENCOUNTER — Telehealth: Payer: Self-pay | Admitting: Neurology

## 2023-08-15 DIAGNOSIS — Z09 Encounter for follow-up examination after completed treatment for conditions other than malignant neoplasm: Secondary | ICD-10-CM | POA: Diagnosis not present

## 2023-08-15 DIAGNOSIS — N179 Acute kidney failure, unspecified: Secondary | ICD-10-CM | POA: Diagnosis not present

## 2023-08-15 DIAGNOSIS — Z299 Encounter for prophylactic measures, unspecified: Secondary | ICD-10-CM | POA: Diagnosis not present

## 2023-08-15 DIAGNOSIS — I509 Heart failure, unspecified: Secondary | ICD-10-CM | POA: Diagnosis not present

## 2023-08-15 DIAGNOSIS — I10 Essential (primary) hypertension: Secondary | ICD-10-CM | POA: Diagnosis not present

## 2023-08-15 MED ORDER — PREGABALIN 75 MG PO CAPS: 75.0000 mg | ORAL_CAPSULE | Freq: Three times a day (TID) | ORAL | 0 refills | Status: DC

## 2023-08-15 NOTE — Telephone Encounter (Signed)
 I called his daughter. His Lyrica  was reduced in the hospital 75 mg TID. I will send in the refill. His last creatinine I see is 2.13, making his creatinine clearance 44, he can have a max Lyrica  of 300 mg a daily in 2-3 divided doses.  Meds ordered this encounter  Medications   pregabalin  (LYRICA ) 75 MG capsule    Sig: Take 1 capsule (75 mg total) by mouth 3 (three) times daily.    Dispense:  90 capsule    Refill:  0    D/c any other strengths on file

## 2023-08-16 DIAGNOSIS — H35363 Drusen (degenerative) of macula, bilateral: Secondary | ICD-10-CM | POA: Diagnosis not present

## 2023-08-16 NOTE — Progress Notes (Addendum)
 Surgical Instructions   Your procedure is scheduled on May 29. Report to [facility] Main Entrance "A" at 12:00 P.M., then check in with the Admitting office. Any questions or running late day of surgery: call [phone]  Questions prior to your surgery date: call [phone], Monday-Friday, 8am-4pm. If you experience any cold or flu symptoms such as cough, fever, chills, shortness of breath, etc. between now and your scheduled surgery, please notify us  at the above number.     Remember:  Do not eat after midnight the night before your surgery  You may drink clear liquids until 11:00am the morning of your surgery.   Clear liquids allowed are: Water , Non-Citrus Juices (without pulp), Carbonated Beverages, Clear Tea (no milk, honey, etc.), Black Coffee Only (NO MILK, CREAM OR POWDERED CREAMER of any kind), and Gatorade.   The day of surgery (if you have diabetes): Drink ONE (1) 12 oz G2 given to you in your pre admission testing appointment by 11:00am the morning of surgery. Drink in one sitting. Do not sip.  This drink was given to you during your hospital  pre-op appointment visit.  Nothing else to drink after completing the  12 oz bottle of G2.         If you have questions, please contact your surgeon's office.  Take these medicines the morning of surgery with A SIP OF WATER   DULoxetine  (CYMBALTA )  pantoprazole  (PROTONIX )  pregabalin  (LYRICA )  rosuvastatin (CRESTOR)  tamsulosin  (FLOMAX )   May take these medicines IF NEEDED: traMADol  (ULTRAM )   ACCORDING TO INSTRUCTIONS FROM YOUR CARDIOLOGIST YOU CAN HOLD ELIQUIS 5 TO 7 DAYS PRIOR TO SURGERY.   One week prior to surgery, STOP taking any Aspirin  (unless otherwise instructed by your surgeon) Aleve, Naproxen, Ibuprofen, Motrin, Advil, Goody's, BC's, all herbal medications, fish oil, and non-prescription vitamins.           WHAT DO I DO ABOUT MY DIABETES MEDICATION?  Do not take oral diabetes medicines (pills) the morning of  surgery.  DO NOT TAKE glipiZIDE (GLUCOTROL XL) THE MORNING OF SURGERY.    HOW TO MANAGE YOUR DIABETES BEFORE AND AFTER SURGERY  Why is it important to control my blood sugar before and after surgery? Improving blood sugar levels before and after surgery helps healing and can limit problems. A way of improving blood sugar control is eating a healthy diet by:  Eating less sugar and carbohydrates  Increasing activity/exercise  Talking with your doctor about reaching your blood sugar goals High blood sugars (greater than 180 mg/dL) can raise your risk of infections and slow your recovery, so you will need to focus on controlling your diabetes during the weeks before surgery. Make sure that the doctor who takes care of your diabetes knows about your planned surgery including the date and location.  How do I manage my blood sugar before surgery? Check your blood sugar at least 4 times a day, starting 2 days before surgery, to make sure that the level is not too high or low.  Check your blood sugar the morning of your surgery when you wake up and every 2 hours until you get to the Short Stay unit.  If your blood sugar is less than 70 mg/dL, you will need to treat for low blood sugar: Do not take insulin . Treat a low blood sugar (less than 70 mg/dL) with  cup of clear juice (cranberry or apple), 4 glucose tablets, OR glucose gel. Recheck blood sugar in 15 minutes after treatment (to  make sure it is greater than 70 mg/dL). If your blood sugar is not greater than 70 mg/dL on recheck, call [phone] for further instructions. Report your blood sugar to the short stay nurse when you get to Short Stay.  If you are admitted to the hospital after surgery: Your blood sugar will be checked by the staff and you will probably be given insulin  after surgery (instead of oral diabetes medicines) to make sure you have good blood sugar levels. The goal for blood sugar control after surgery is 80-180 mg/dL. To  diabetes             Do NOT Smoke (Tobacco/Vaping) for 24 hours prior to your procedure.  If you use a CPAP at night, you may bring your mask/headgear for your overnight stay.   You will be asked to remove any contacts, glasses, piercing's, hearing aid's, dentures/partials prior to surgery. Please bring cases for these items if needed.    Patients discharged the day of surgery will not be allowed to drive home, and someone needs to stay with them for 24 hours.  SURGICAL WAITING ROOM VISITATION Patients may have no more than 2 support people in the waiting area - these visitors may rotate.   Pre-op nurse will coordinate an appropriate time for 1 ADULT support person, who may not rotate, to accompany patient in pre-op.  Children under the age of 32 must have an adult with them who is not the patient and must remain in the main waiting area with an adult.  If the patient needs to stay at the hospital during part of their recovery, the visitor guidelines for inpatient rooms apply.  Please refer to the [facility] website for the visitor guidelines for any additional information.   If you received a COVID test during your pre-op visit  it is requested that you wear a mask when out in public, stay away from anyone that may not be feeling well and notify your surgeon if you develop symptoms. If you have been in contact with anyone that has tested positive in the last 10 days please notify you surgeon.      Pre-operative 5 CHG Bathing Instructions   You can play a key role in reducing the risk of infection after surgery. Your skin needs to be as free of germs as possible. You can reduce the number of germs on your skin by washing with CHG (chlorhexidine  gluconate) soap before surgery. CHG is an antiseptic soap that kills germs and continues to kill germs even after washing.   DO NOT use if you have an allergy to chlorhexidine /CHG or antibacterial soaps. If your skin becomes reddened or  irritated, stop using the CHG and notify one of our RNs at [phone].   Please shower with the CHG soap starting 4 days before surgery using the following schedule:     Please keep in mind the following:  DO NOT shave, including legs and underarms, starting the day of your first shower.   You may shave your face at any point before/day of surgery.  Place clean sheets on your bed the day you start using CHG soap. Use a clean washcloth (not used since being washed) for each shower. DO NOT sleep with pets once you start using the CHG.   CHG Shower Instructions:  Wash your face and private area with normal soap. If you choose to wash your hair, wash first with your normal shampoo.  After you use shampoo/soap, rinse your hair  and body thoroughly to remove shampoo/soap residue.  Turn the water  OFF and apply about 3 tablespoons (45 ml) of CHG soap to a CLEAN washcloth.  Apply CHG soap ONLY FROM YOUR NECK DOWN TO YOUR TOES (washing for 3-5 minutes)  DO NOT use CHG soap on face, private areas, open wounds, or sores.  Pay special attention to the area where your surgery is being performed.  If you are having back surgery, having someone wash your back for you may be helpful. Wait 2 minutes after CHG soap is applied, then you may rinse off the CHG soap.  Pat dry with a clean towel  Put on clean clothes/pajamas   If you choose to wear lotion, please use ONLY the CHG-compatible lotions that are listed below.  Additional instructions for the day of surgery: DO NOT APPLY any lotions, deodorants, cologne, or perfumes.   Do not bring valuables to the hospital. [facility] is not responsible for any belongings/valuables. Do not wear nail polish, gel polish, artificial nails, or any other type of covering on natural nails (fingers and toes) Do not wear jewelry or makeup Put on clean/comfortable clothes.  Please brush your teeth.  Ask your nurse before applying any prescription medications to the  skin.     CHG Compatible Lotions   Aveeno Moisturizing lotion  Cetaphil Moisturizing Cream  Cetaphil Moisturizing Lotion  Clairol Herbal Essence Moisturizing Lotion, Dry Skin  Clairol Herbal Essence Moisturizing Lotion, Extra Dry Skin  Clairol Herbal Essence Moisturizing Lotion, Normal Skin  Curel Age Defying Therapeutic Moisturizing Lotion with Alpha Hydroxy  Curel Extreme Care Body Lotion  Curel Soothing Hands Moisturizing Hand Lotion  Curel Therapeutic Moisturizing Cream, Fragrance-Free  Curel Therapeutic Moisturizing Lotion, Fragrance-Free  Curel Therapeutic Moisturizing Lotion, Original Formula  Eucerin Daily Replenishing Lotion  Eucerin Dry Skin Therapy Plus Alpha Hydroxy Crme  Eucerin Dry Skin Therapy Plus Alpha Hydroxy Lotion  Eucerin Original Crme  Eucerin Original Lotion  Eucerin Plus Crme Eucerin Plus Lotion  Eucerin TriLipid Replenishing Lotion  Keri Anti-Bacterial Hand Lotion  Keri Deep Conditioning Original Lotion Dry Skin Formula Softly Scented  Keri Deep Conditioning Original Lotion, Fragrance Free Sensitive Skin Formula  Keri Lotion Fast Absorbing Fragrance Free Sensitive Skin Formula  Keri Lotion Fast Absorbing Softly Scented Dry Skin Formula  Keri Original Lotion  Keri Skin Renewal Lotion Keri Silky Smooth Lotion  Keri Silky Smooth Sensitive Skin Lotion  Nivea Body Creamy Conditioning Oil  Nivea Body Extra Enriched Lotion  Nivea Body Original Lotion  Nivea Body Sheer Moisturizing Lotion Nivea Crme  Nivea Skin Firming Lotion  NutraDerm 30 Skin Lotion  NutraDerm Skin Lotion  NutraDerm Therapeutic Skin Cream  NutraDerm Therapeutic Skin Lotion  ProShield Protective Hand Cream  Provon moisturizing lotion  Please read over the following fact sheets that you were given.

## 2023-08-17 DIAGNOSIS — M4302 Spondylolysis, cervical region: Secondary | ICD-10-CM | POA: Diagnosis not present

## 2023-08-17 DIAGNOSIS — M542 Cervicalgia: Secondary | ICD-10-CM | POA: Diagnosis not present

## 2023-08-17 DIAGNOSIS — M5412 Radiculopathy, cervical region: Secondary | ICD-10-CM | POA: Diagnosis not present

## 2023-08-19 ENCOUNTER — Other Ambulatory Visit: Payer: Self-pay

## 2023-08-19 ENCOUNTER — Encounter (HOSPITAL_COMMUNITY): Payer: Self-pay

## 2023-08-19 ENCOUNTER — Encounter (HOSPITAL_COMMUNITY)
Admission: RE | Admit: 2023-08-19 | Discharge: 2023-08-19 | Disposition: A | Discharge status: Home / Self Care | Source: Ambulatory Visit | Attending: Orthopedic Surgery | Admitting: Orthopedic Surgery

## 2023-08-19 VITALS — BP 104/75 | HR 69 | Temp 98.2°F | Resp 18 | Ht 70.0 in | Wt 235.0 lb

## 2023-08-19 DIAGNOSIS — Z905 Acquired absence of kidney: Secondary | ICD-10-CM | POA: Diagnosis not present

## 2023-08-19 DIAGNOSIS — N189 Chronic kidney disease, unspecified: Secondary | ICD-10-CM | POA: Diagnosis not present

## 2023-08-19 DIAGNOSIS — G4733 Obstructive sleep apnea (adult) (pediatric): Secondary | ICD-10-CM | POA: Insufficient documentation

## 2023-08-19 DIAGNOSIS — Z79899 Other long term (current) drug therapy: Secondary | ICD-10-CM | POA: Insufficient documentation

## 2023-08-19 DIAGNOSIS — I2510 Atherosclerotic heart disease of native coronary artery without angina pectoris: Secondary | ICD-10-CM | POA: Diagnosis not present

## 2023-08-19 DIAGNOSIS — Z87891 Personal history of nicotine dependence: Secondary | ICD-10-CM | POA: Diagnosis not present

## 2023-08-19 DIAGNOSIS — K219 Gastro-esophageal reflux disease without esophagitis: Secondary | ICD-10-CM | POA: Insufficient documentation

## 2023-08-19 DIAGNOSIS — Z01818 Encounter for other preprocedural examination: Secondary | ICD-10-CM

## 2023-08-19 DIAGNOSIS — I5022 Chronic systolic (congestive) heart failure: Secondary | ICD-10-CM | POA: Insufficient documentation

## 2023-08-19 DIAGNOSIS — I4819 Other persistent atrial fibrillation: Secondary | ICD-10-CM | POA: Diagnosis not present

## 2023-08-19 DIAGNOSIS — Z01812 Encounter for preprocedural laboratory examination: Secondary | ICD-10-CM | POA: Insufficient documentation

## 2023-08-19 DIAGNOSIS — E1122 Type 2 diabetes mellitus with diabetic chronic kidney disease: Secondary | ICD-10-CM | POA: Insufficient documentation

## 2023-08-19 HISTORY — DX: Malignant (primary) neoplasm, unspecified: C80.1

## 2023-08-19 HISTORY — DX: Cardiac arrhythmia, unspecified: I49.9

## 2023-08-19 HISTORY — DX: Peripheral vascular disease, unspecified: I73.9

## 2023-08-19 LAB — BASIC METABOLIC PANEL WITH GFR
Anion gap: 8 (ref 5–15)
BUN: 17 mg/dL (ref 8–23)
CO2: 27 mmol/L (ref 22–32)
Calcium: 9.4 mg/dL (ref 8.9–10.3)
Chloride: 103 mmol/L (ref 98–111)
Creatinine, Ser: 1.37 mg/dL — ABNORMAL HIGH (ref 0.61–1.24)
GFR, Estimated: 53 mL/min — ABNORMAL LOW (ref >60)
Glucose, Bld: 148 mg/dL — ABNORMAL HIGH (ref 70–99)
Potassium: 4.6 mmol/L (ref 3.5–5.1)
Sodium: 138 mmol/L (ref 135–145)

## 2023-08-19 LAB — GLUCOSE, CAPILLARY: Glucose-Capillary: 169 mg/dL — ABNORMAL HIGH (ref 70–99)

## 2023-08-19 LAB — SURGICAL PCR SCREEN
MRSA, PCR: NEGATIVE
Staphylococcus aureus: POSITIVE — AB

## 2023-08-19 NOTE — Progress Notes (Addendum)
 PCP - Derek Flavors Vyas,MD Cardiologist - Murl Armor Rossi,MD Nephrologist - Dr. Woodie Hazard  PPM/ICD - denies Device Orders -  Rep Notified -   Chest x-ray -  EKG - 08/12/23 Stress Test - 04/19/12 ECHO - 11/19/22 Cardiac Cath - 03/15/23  Sleep Study - +OSA CPAP - pt has CPAP and uses it intermittently but says it needs repair.  Fasting Blood Sugar - pt has not checked in a long time.He does not know what his FBS is . He agrees to check his blood sugar the DOS. Checks Blood Sugar _____ times a day  Last dose of GLP1 agonist-  na GLP1 instructions:   Blood Thinner Instructions:per cardiology,hold Eliquis 5-7 days prior to surgery. Pt's daughter,Pam will call Abe Abed at Dr. Wallace Gully office to confirm the date that Dr. Jackee Marus wants pt to stop Eliquis. Pam will also inform Abe Abed of patient's hospitaliztion for AKI in May.  Aspirin  Instructions:na  ERAS Protcol - clear liquids until 1100 PRE-SURGERY Ensure or G2- G2  COVID TEST- na   Anesthesia review: yes- recent hospitalization for AKI,hx Afib on Eliquis. Pt states he saw his primary care physician and nephrologist last week for follow up after hospitalization.   Patient denies shortness of breath, fever, cough and chest pain at PAT appointment   All instructions explained to the patient, with a verbal understanding of the material. Patient agrees to go over the instructions while at home for a better understanding. . The opportunity to ask questions was provided.

## 2023-08-20 NOTE — Progress Notes (Signed)
 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  Cath 03/2023 showed mild CAD without flow-limiting stenosis.  Last seen by Dr. Pearly [name] on 08/12/2023.  Stable at that time from cardiac standpoint, no changes to management, plans to follow-up with the EP to further discuss medication management versus ablation for history of persistent AF.  He was noted to be in sinus rhythm at that time.  He was ultimately cleared for telephone encounter 08/23/2023 by Jason Andrew, FNP stating, "Will need to stop his Eliquis 2 days prior to surgery, and restart Eliquis 1 to 3 days postsurgery at surgeon's discretion.  His revised cardiac risk index equals 1 which puts him at a risk of major adverse cardiac event at 6% perioperatively.  He is cleared to undergo thoracic laminectomy for placement of spinal cord stimulator under general anesthesia from cardiology standpoint."  History of CKD s/p right nephrectomy for cancer.  He was admitted to [facility] 5/7 through 08/09/2023 for weakness and AKI.  The day prior, he had been seen by his neurologist and had labs done which came back with creatinine of 6.25 and he was directed to the ED.  Labs in the ED showed creatinine of 4.26.  Patient reported decreased p.o. intake for about 3 weeks.  Workup was overall reassuring.  Received continuous IV hydration while admitted with creatinine trending down, 2.13 at discharge.  He was discharged with nephrology follow-up with Dr. Carrolyn [name] as well as PCP follow-up with Dr. Darien [name].  Other pertinent history includes former smoker, GERD on PPI, non-insulin -dependent DM2 A1c 7.1 2025, OSA not currently on CPAP (states he has Medi-Cal).  BMP done at PAT appointment shows continued improvement in kidney function, creatinine 1.37, otherwise unremarkable.  Preop labs were faxed to  nephrologist Dr. Carrolyn [name] who subsequently cleared the patient as moderate risk.  EKG 08/12/2023 (copy scanned in media): NSR.  Rate 75.  Cath 03/18/2023 (Care Everywhere): CONCLUSIONS:  - Mild coronary artery disease without apparent flow limiting lesions  - Normal LVEDP   RECOMMENDATIONS:  - Per referring provider  - Femoral access protocol   TTE 11/19/2022 (Care Everywhere): Summary   1. Technically difficult study.    2. Limited study to assess ventricular function.    3. The left ventricle is upper normal in size with normal wall thickness.    4. The left ventricular systolic function is normal, LVEF is visually  estimated at 55-60%.    5. The right ventricle is mildly dilated in size, with normal systolic  function.     Jason [name] [facility] Short Stay Center/Anesthesiology Phone [phone] 08/23/2023 2:56 PM

## 2023-08-23 NOTE — Anesthesia Preprocedure Evaluation (Signed)
 Anesthesia Evaluation  Patient identified by MRN, date of birth, ID band Patient awake    Reviewed: Allergy & Precautions, NPO status , Patient's Chart, lab work & pertinent test results, reviewed documented beta blocker date and time   Airway Mallampati: II  TM Distance: >3 FB Neck ROM: Full    Dental  (+) Dental Advisory Given, Missing, Poor Dentition   Pulmonary sleep apnea and Continuous Positive Airway Pressure Ventilation , former smoker   Pulmonary exam normal breath sounds clear to auscultation       Cardiovascular hypertension, Pt. on home beta blockers and Pt. on medications (-) angina + Peripheral Vascular Disease  (-) Past MI + dysrhythmias Atrial Fibrillation  Rhythm:Regular Rate:Normal     Neuro/Psych TIA Neuromuscular disease    GI/Hepatic Neg liver ROS,GERD  Medicated and Controlled,,  Endo/Other  diabetes, Type 2, Oral Hypoglycemic Agents  Obesity   Renal/GU Renal InsufficiencyRenal disease (H/o renal cancer s/p right nephrectomy)     Musculoskeletal  (+) Arthritis ,    Abdominal   Peds  Hematology  (+) Blood dyscrasia (Eliquis)   Anesthesia Other Findings Day of surgery medications reviewed with the patient.  Reproductive/Obstetrics                             Anesthesia Physical Anesthesia Plan  ASA: 3  Anesthesia Plan: General   Post-op Pain Management: Tylenol  PO (pre-op)*   Induction: Intravenous  PONV Risk Score and Plan: 2 and Ondansetron   Airway Management Planned: Oral ETT  Additional Equipment:   Intra-op Plan:   Post-operative Plan: Extubation in OR  Informed Consent: I have reviewed the patients History and Physical, chart, labs and discussed the procedure including the risks, benefits and alternatives for the proposed anesthesia with the patient or authorized representative who has indicated his/her understanding and acceptance.     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  Cath 03/2023 showed mild CAD without flow-limiting stenosis.  Last seen by Dr. Pearly Bound on 08/12/2023.  Stable at that time from cardiac standpoint, no changes to management, plans to follow-up with the EP to further discuss medication management versus ablation for history of persistent AF.  He was noted to be in sinus rhythm at that time.  He was ultimately cleared for telephone encounter 08/23/2023 by Mohammed Andrew, FNP stating, "Will need to stop his Eliquis 2 days prior to surgery, and restart Eliquis 1 to 3 days postsurgery at surgeon's discretion.  His revised cardiac risk index equals 1 which puts him at a risk of major adverse cardiac event at 6% perioperatively.  He is cleared to undergo thoracic laminectomy for placement of spinal cord stimulator under general anesthesia from cardiology standpoint."  History of CKD s/p right nephrectomy for cancer.  He was admitted to [facility] 5/7 through 08/09/2023 for weakness and AKI.  The day prior, he had been seen by his neurologist and had labs done which came back with creatinine of 6.25 and he was directed to the ED.  Labs in the ED showed creatinine of 4.26.  Patient reported decreased p.o. intake for about 3 weeks.  Workup was overall reassuring.  Received continuous IV hydration while admitted with creatinine trending down,  2.13 at discharge.  He was discharged with nephrology follow-up with Dr. Carrolyn Clan as well as PCP follow-up with Dr. Darien Eden.  Other pertinent history includes former smoker, GERD on PPI, non-insulin -dependent DM2 A1c 7.1 2025, OSA not currently on CPAP (states he has Medi-Cal).  BMP done at PAT appointment shows continued improvement in kidney function,  creatinine 1.37, otherwise unremarkable.  Preop labs were faxed to nephrologist Dr. Carrolyn Clan who subsequently cleared the patient as moderate risk.  EKG 08/12/2023 (copy scanned in media): NSR.  Rate 75.  Cath 03/18/2023 (Care Everywhere): CONCLUSIONS:  - Mild coronary artery disease without apparent flow limiting lesions  - Normal LVEDP   RECOMMENDATIONS:  - Per referring provider  - Femoral access protocol   TTE 11/19/2022 (Care Everywhere): Summary  1. Technically difficult study.  2. Limited study to assess ventricular function.  3. The left ventricle is upper normal in size with normal wall thickness.  4. The left ventricular systolic function is normal, LVEF is visually  estimated at 55-60%.  5. The right ventricle is mildly dilated in size, with normal systolic  function.    )        Anesthesia Quick Evaluation

## 2023-08-27 DIAGNOSIS — E119 Type 2 diabetes mellitus without complications: Secondary | ICD-10-CM | POA: Diagnosis not present

## 2023-08-27 DIAGNOSIS — N329 Bladder disorder, unspecified: Secondary | ICD-10-CM | POA: Diagnosis not present

## 2023-08-27 DIAGNOSIS — I4891 Unspecified atrial fibrillation: Secondary | ICD-10-CM | POA: Diagnosis not present

## 2023-08-28 DIAGNOSIS — I4891 Unspecified atrial fibrillation: Secondary | ICD-10-CM | POA: Diagnosis not present

## 2023-08-28 DIAGNOSIS — N329 Bladder disorder, unspecified: Secondary | ICD-10-CM | POA: Diagnosis not present

## 2023-08-28 DIAGNOSIS — E119 Type 2 diabetes mellitus without complications: Secondary | ICD-10-CM | POA: Diagnosis not present

## 2023-08-29 ENCOUNTER — Other Ambulatory Visit: Payer: Self-pay

## 2023-08-29 ENCOUNTER — Ambulatory Visit (HOSPITAL_BASED_OUTPATIENT_CLINIC_OR_DEPARTMENT_OTHER): Payer: Self-pay | Admitting: Anesthesiology

## 2023-08-29 ENCOUNTER — Ambulatory Visit (HOSPITAL_COMMUNITY)
Admission: RE | Admit: 2023-08-29 | Discharge: 2023-08-29 | Disposition: A | Discharge status: Home / Self Care | Source: Ambulatory Visit | Attending: Orthopedic Surgery | Admitting: Orthopedic Surgery

## 2023-08-29 ENCOUNTER — Ambulatory Visit (HOSPITAL_COMMUNITY): Payer: Self-pay | Admitting: Physician Assistant

## 2023-08-29 ENCOUNTER — Encounter (HOSPITAL_COMMUNITY): Payer: Self-pay | Admitting: Orthopedic Surgery

## 2023-08-29 ENCOUNTER — Ambulatory Visit (HOSPITAL_COMMUNITY)

## 2023-08-29 ENCOUNTER — Ambulatory Visit (HOSPITAL_COMMUNITY)
Admission: RE | Disposition: A | Discharge status: Home / Self Care | Payer: Self-pay | Source: Ambulatory Visit | Attending: Orthopedic Surgery

## 2023-08-29 DIAGNOSIS — G894 Chronic pain syndrome: Secondary | ICD-10-CM

## 2023-08-29 DIAGNOSIS — Z01818 Encounter for other preprocedural examination: Secondary | ICD-10-CM

## 2023-08-29 DIAGNOSIS — Z7984 Long term (current) use of oral hypoglycemic drugs: Secondary | ICD-10-CM | POA: Diagnosis not present

## 2023-08-29 DIAGNOSIS — I4891 Unspecified atrial fibrillation: Secondary | ICD-10-CM | POA: Diagnosis not present

## 2023-08-29 DIAGNOSIS — Z87891 Personal history of nicotine dependence: Secondary | ICD-10-CM | POA: Insufficient documentation

## 2023-08-29 DIAGNOSIS — Z85528 Personal history of other malignant neoplasm of kidney: Secondary | ICD-10-CM | POA: Insufficient documentation

## 2023-08-29 DIAGNOSIS — G4730 Sleep apnea, unspecified: Secondary | ICD-10-CM | POA: Diagnosis not present

## 2023-08-29 DIAGNOSIS — Z905 Acquired absence of kidney: Secondary | ICD-10-CM | POA: Insufficient documentation

## 2023-08-29 DIAGNOSIS — Z79899 Other long term (current) drug therapy: Secondary | ICD-10-CM | POA: Insufficient documentation

## 2023-08-29 DIAGNOSIS — Z7901 Long term (current) use of anticoagulants: Secondary | ICD-10-CM | POA: Diagnosis not present

## 2023-08-29 DIAGNOSIS — E1151 Type 2 diabetes mellitus with diabetic peripheral angiopathy without gangrene: Secondary | ICD-10-CM | POA: Diagnosis not present

## 2023-08-29 DIAGNOSIS — K219 Gastro-esophageal reflux disease without esophagitis: Secondary | ICD-10-CM | POA: Diagnosis not present

## 2023-08-29 DIAGNOSIS — Z794 Long term (current) use of insulin: Secondary | ICD-10-CM | POA: Insufficient documentation

## 2023-08-29 DIAGNOSIS — Z8673 Personal history of transient ischemic attack (TIA), and cerebral infarction without residual deficits: Secondary | ICD-10-CM | POA: Insufficient documentation

## 2023-08-29 DIAGNOSIS — I10 Essential (primary) hypertension: Secondary | ICD-10-CM | POA: Insufficient documentation

## 2023-08-29 DIAGNOSIS — Z9682 Presence of neurostimulator: Secondary | ICD-10-CM | POA: Diagnosis not present

## 2023-08-29 HISTORY — PX: THORACIC LAMINECTOMY FOR SPINAL CORD STIMULATOR: SHX6887

## 2023-08-29 LAB — GLUCOSE, CAPILLARY
Glucose-Capillary: 113 mg/dL — ABNORMAL HIGH (ref 70–99)
Glucose-Capillary: 71 mg/dL (ref 70–99)
Glucose-Capillary: 104 mg/dL — ABNORMAL HIGH (ref 70–99)
Glucose-Capillary: 108 mg/dL — ABNORMAL HIGH (ref 70–99)

## 2023-08-29 SURGERY — THORACIC LAMINECTOMY FOR SPINAL CORD STIMULATOR
Anesthesia: General | Site: Back

## 2023-08-29 MED ORDER — SUGAMMADEX SODIUM 200 MG/2ML IV SOLN
INTRAVENOUS | Status: DC | PRN
  Administered 2023-08-29: 400 mg via INTRAVENOUS

## 2023-08-29 MED ORDER — BUPIVACAINE-EPINEPHRINE 0.25% -1:200000 IJ SOLN
INTRAMUSCULAR | Status: DC | PRN
  Administered 2023-08-29: 6 mL

## 2023-08-29 MED ORDER — DEXTROSE 50 % IV SOLN
INTRAVENOUS | Status: DC | PRN
  Administered 2023-08-29: 25 mL via INTRAVENOUS

## 2023-08-29 MED ORDER — SODIUM CHLORIDE 0.9 % IV SOLN: INTRAVENOUS | Status: DC | PRN

## 2023-08-29 MED ORDER — CHLORHEXIDINE GLUCONATE 0.12 % MT SOLN
15.0000 mL | Freq: Once | OROMUCOSAL | Status: AC
  Administered 2023-08-29: 15 mL via OROMUCOSAL
  Filled 2023-08-29: qty 15

## 2023-08-29 MED ORDER — THROMBIN 20000 UNITS EX SOLR
CUTANEOUS | Status: DC | PRN
  Administered 2023-08-29: 20000 [IU] via TOPICAL

## 2023-08-29 MED ORDER — LACTATED RINGERS IV SOLN: INTRAVENOUS | Status: DC | PRN

## 2023-08-29 MED ORDER — CEFAZOLIN SODIUM-DEXTROSE 2-4 GM/100ML-% IV SOLN
2.0000 g | INTRAVENOUS | Status: AC
  Administered 2023-08-29: 2 g via INTRAVENOUS
  Filled 2023-08-29: qty 100

## 2023-08-29 MED ORDER — METHOCARBAMOL 500 MG PO TABS: 500.0000 mg | ORAL_TABLET | Freq: Four times a day (QID) | ORAL | 0 refills | Status: AC

## 2023-08-29 MED ORDER — ONDANSETRON HCL 4 MG/2ML IJ SOLN
INTRAMUSCULAR | Status: DC | PRN
  Administered 2023-08-29: 4 mg via INTRAVENOUS

## 2023-08-29 MED ORDER — FENTANYL CITRATE (PF) 250 MCG/5ML IJ SOLN
INTRAMUSCULAR | Status: DC | PRN
Start: 2023-08-29 — End: 2023-08-29
  Administered 2023-08-29: 100 ug via INTRAVENOUS

## 2023-08-29 MED ORDER — HYDROCODONE-ACETAMINOPHEN 5-325 MG PO TABS: 1.0000 | ORAL_TABLET | Freq: Four times a day (QID) | ORAL | 0 refills | Status: AC | PRN

## 2023-08-29 MED ORDER — LIDOCAINE 2% (20 MG/ML) 5 ML SYRINGE
INTRAMUSCULAR | Status: AC
  Filled 2023-08-29: qty 5

## 2023-08-29 MED ORDER — DEXAMETHASONE SODIUM PHOSPHATE 10 MG/ML IJ SOLN
INTRAMUSCULAR | Status: AC
  Filled 2023-08-29: qty 1

## 2023-08-29 MED ORDER — ONDANSETRON HCL 4 MG/2ML IJ SOLN
INTRAMUSCULAR | Status: AC
  Filled 2023-08-29: qty 2

## 2023-08-29 MED ORDER — POVIDONE-IODINE 7.5 % EX SOLN
Freq: Once | CUTANEOUS | Status: DC
  Filled 2023-08-29: qty 118

## 2023-08-29 MED ORDER — FENTANYL CITRATE (PF) 250 MCG/5ML IJ SOLN
INTRAMUSCULAR | Status: AC
  Filled 2023-08-29: qty 5

## 2023-08-29 MED ORDER — BUPIVACAINE-EPINEPHRINE (PF) 0.25% -1:200000 IJ SOLN
INTRAMUSCULAR | Status: AC
Start: 2023-08-29 — End: ?
  Filled 2023-08-29: qty 30

## 2023-08-29 MED ORDER — EPHEDRINE SULFATE-NACL 50-0.9 MG/10ML-% IV SOSY
PREFILLED_SYRINGE | INTRAVENOUS | Status: DC | PRN
  Administered 2023-08-29 (×2): 5 mg via INTRAVENOUS

## 2023-08-29 MED ORDER — BUPIVACAINE LIPOSOME 1.3 % IJ SUSP
INTRAMUSCULAR | Status: DC | PRN
Start: 2023-08-29 — End: 2023-08-29
  Administered 2023-08-29: 20 mL

## 2023-08-29 MED ORDER — PROPOFOL 10 MG/ML IV BOLUS
INTRAVENOUS | Status: AC
  Filled 2023-08-29: qty 20

## 2023-08-29 MED ORDER — ONDANSETRON HCL 4 MG/2ML IJ SOLN: 4.0000 mg | Freq: Once | INTRAMUSCULAR | Status: DC | PRN

## 2023-08-29 MED ORDER — BUPIVACAINE LIPOSOME 1.3 % IJ SUSP
INTRAMUSCULAR | Status: AC
  Filled 2023-08-29: qty 20

## 2023-08-29 MED ORDER — ALBUMIN HUMAN 5 % IV SOLN
INTRAVENOUS | Status: DC | PRN
Start: 2023-08-29 — End: 2023-08-29

## 2023-08-29 MED ORDER — PROPOFOL 10 MG/ML IV BOLUS
INTRAVENOUS | Status: DC | PRN
  Administered 2023-08-29: 140 mg via INTRAVENOUS

## 2023-08-29 MED ORDER — ROCURONIUM BROMIDE 10 MG/ML (PF) SYRINGE
PREFILLED_SYRINGE | INTRAVENOUS | Status: DC | PRN
  Administered 2023-08-29: 20 mg via INTRAVENOUS
  Administered 2023-08-29: 60 mg via INTRAVENOUS

## 2023-08-29 MED ORDER — PHENYLEPHRINE HCL-NACL 20-0.9 MG/250ML-% IV SOLN
INTRAVENOUS | Status: DC | PRN
  Administered 2023-08-29: 40 ug/min via INTRAVENOUS

## 2023-08-29 MED ORDER — PHENYLEPHRINE 80 MCG/ML (10ML) SYRINGE FOR IV PUSH (FOR BLOOD PRESSURE SUPPORT)
PREFILLED_SYRINGE | INTRAVENOUS | Status: AC
  Filled 2023-08-29: qty 10

## 2023-08-29 MED ORDER — ORAL CARE MOUTH RINSE: 15.0000 mL | Freq: Once | OROMUCOSAL | Status: AC

## 2023-08-29 MED ORDER — FENTANYL CITRATE (PF) 100 MCG/2ML IJ SOLN: 25.0000 ug | INTRAMUSCULAR | Status: DC | PRN

## 2023-08-29 MED ORDER — ACETAMINOPHEN 500 MG PO TABS
1000.0000 mg | ORAL_TABLET | Freq: Once | ORAL | Status: AC
  Administered 2023-08-29: 1000 mg via ORAL
  Filled 2023-08-29: qty 2

## 2023-08-29 MED ORDER — INSULIN ASPART 100 UNIT/ML IJ SOLN: 0.0000 [IU] | INTRAMUSCULAR | Status: DC | PRN

## 2023-08-29 MED ORDER — ROCURONIUM BROMIDE 10 MG/ML (PF) SYRINGE
PREFILLED_SYRINGE | INTRAVENOUS | Status: AC
Start: 2023-08-29 — End: ?
  Filled 2023-08-29: qty 10

## 2023-08-29 MED ORDER — LIDOCAINE 2% (20 MG/ML) 5 ML SYRINGE
INTRAMUSCULAR | Status: DC | PRN
  Administered 2023-08-29: 100 mg via INTRAVENOUS

## 2023-08-29 MED ORDER — THROMBIN (RECOMBINANT) 20000 UNITS EX SOLR
CUTANEOUS | Status: AC
  Filled 2023-08-29: qty 20000

## 2023-08-29 MED ORDER — PHENYLEPHRINE 80 MCG/ML (10ML) SYRINGE FOR IV PUSH (FOR BLOOD PRESSURE SUPPORT)
PREFILLED_SYRINGE | INTRAVENOUS | Status: DC | PRN
  Administered 2023-08-29 (×2): 80 ug via INTRAVENOUS
  Administered 2023-08-29: 160 ug via INTRAVENOUS

## 2023-08-29 SURGICAL SUPPLY — 60 items
BAG COUNTER SPONGE SURGICOUNT (BAG) ×2 IMPLANT
BAG ISOLATION DRAPE 18X18 (DRAPES) ×2 IMPLANT
BENZOIN TINCTURE PRP APPL 2/3 (GAUZE/BANDAGES/DRESSINGS) ×2 IMPLANT
BUR MATCHSTICK NEURO 3.0 LAGG (BURR) IMPLANT
BUR ROUND PRECISION 4.0 (BURR) IMPLANT
CANISTER SUCTION 3000ML PPV (SUCTIONS) ×2 IMPLANT
CONTROLLER NEUROSTIM F/PAT APP ×1 IMPLANT
CONTROLLER NEUROSTIM PATIENT (NEUROSURGERY SUPPLIES) IMPLANT
CORD BIPOLAR FORCEPS 12FT (ELECTRODE) ×2 IMPLANT
DRAPE C-ARM 42X72 X-RAY (DRAPES) ×2 IMPLANT
DRAPE POUCH INSTRU U-SHP 10X18 (DRAPES) ×2 IMPLANT
DRAPE SURG 17X23 STRL (DRAPES) ×6 IMPLANT
DURAPREP 26ML APPLICATOR (WOUND CARE) ×2 IMPLANT
ELECT CAUTERY BLADE 6.4 (BLADE) ×2 IMPLANT
ELECTRODE REM PT RTRN 9FT ADLT (ELECTROSURGICAL) ×2 IMPLANT
GAUZE 4X4 16PLY ~~LOC~~+RFID DBL (SPONGE) ×2 IMPLANT
GAUZE SPONGE 4X4 12PLY STRL (GAUZE/BANDAGES/DRESSINGS) ×2 IMPLANT
GENERATOR NEUROSTIM ETERNA 16 (Generator) IMPLANT
GLOVE BIO SURGEON STRL SZ 6.5 (GLOVE) ×2 IMPLANT
GLOVE BIO SURGEON STRL SZ8 (GLOVE) ×2 IMPLANT
GLOVE BIOGEL PI IND STRL 7.0 (GLOVE) IMPLANT
GLOVE BIOGEL PI IND STRL 8 (GLOVE) ×2 IMPLANT
GLOVE SURG ENC MOIS LTX SZ6.5 (GLOVE) ×2 IMPLANT
GOWN STRL REUS W/ TWL LRG LVL3 (GOWN DISPOSABLE) ×4 IMPLANT
GOWN STRL REUS W/ TWL XL LVL3 (GOWN DISPOSABLE) ×2 IMPLANT
KIT BASIN OR (CUSTOM PROCEDURE TRAY) ×2 IMPLANT
KIT CHARGER NSTIM ETERNA SU (NEUROSURGERY SUPPLIES) IMPLANT
KIT TURNOVER KIT B (KITS) ×2 IMPLANT
LEAD LAMITRODE PENTA 3MM 60CM (Lead) IMPLANT
MANUAL PATIENT NSTIM ETERNA SU (MISCELLANEOUS) IMPLANT
NDL HYPO 22X1.5 SAFETY MO (MISCELLANEOUS) IMPLANT
NDL HYPO 25GX1X1/2 BEV (NEEDLE) ×2 IMPLANT
NDL SPNL 18GX3.5 QUINCKE PK (NEEDLE) IMPLANT
NDL SUT 6 .5 CRC .975X.05 MAYO (NEEDLE) ×2 IMPLANT
NEEDLE HYPO 22X1.5 SAFETY MO (MISCELLANEOUS) ×1 IMPLANT
NEEDLE HYPO 25GX1X1/2 BEV (NEEDLE) ×1 IMPLANT
NEEDLE SPNL 18GX3.5 QUINCKE PK (NEEDLE) ×2 IMPLANT
NS IRRIG 1000ML POUR BTL (IV SOLUTION) ×2 IMPLANT
PACK LAMINECTOMY ORTHO (CUSTOM PROCEDURE TRAY) ×2 IMPLANT
PACK UNIVERSAL I (CUSTOM PROCEDURE TRAY) ×2 IMPLANT
PAD ARMBOARD POSITIONER FOAM (MISCELLANEOUS) ×4 IMPLANT
PENCIL BUTTON HOLSTER BLD 10FT (ELECTRODE) IMPLANT
SPONGE INTESTINAL PEANUT (DISPOSABLE) IMPLANT
SPONGE SURGIFOAM ABS GEL 100C (HEMOSTASIS) IMPLANT
SPONGE SURGIFOAM ABS GEL 12-7 (HEMOSTASIS) IMPLANT
SPONGE T-LAP 4X18 ~~LOC~~+RFID (SPONGE) ×2 IMPLANT
STRIP CLOSURE SKIN 1/2X4 (GAUZE/BANDAGES/DRESSINGS) ×2 IMPLANT
SUT MNCRL AB 3-0 PS2 18 (SUTURE) ×4 IMPLANT
SUT SILK 3 0SH CR/8 30 (SUTURE) IMPLANT
SUT SILK 3-0 18XBRD TIE 12 (SUTURE) IMPLANT
SUT VIC AB 0 CT1 18XCR BRD 8 (SUTURE) ×2 IMPLANT
SUT VIC AB 1 CT1 18XCR BRD 8 (SUTURE) ×2 IMPLANT
SUT VIC AB 2-0 CT2 18 VCP726D (SUTURE) ×2 IMPLANT
SUTURE FIBERWR 2-0 18 17.9 3/8 (SUTURE) ×2 IMPLANT
SYR BULB IRRIG 60ML STRL (SYRINGE) ×2 IMPLANT
SYR CONTROL 10ML LL (SYRINGE) ×2 IMPLANT
TOWEL GREEN STERILE (TOWEL DISPOSABLE) ×2 IMPLANT
TOWEL GREEN STERILE FF (TOWEL DISPOSABLE) ×2 IMPLANT
TRAY FOLEY MTR SLVR 16FR STAT (SET/KITS/TRAYS/PACK) IMPLANT
WATER STERILE IRR 1000ML POUR (IV SOLUTION) ×2 IMPLANT

## 2023-08-29 NOTE — Anesthesia Procedure Notes (Signed)
 Procedure Name: Intubation Date/Time: 08/29/2023 3:30 PM  Performed by: Jamas Maywood, CRNAPre-anesthesia Checklist: Patient identified, Emergency Drugs available, Suction available and Patient being monitored Patient Re-evaluated:Patient Re-evaluated prior to induction Oxygen Delivery Method: Circle system utilized Preoxygenation: Pre-oxygenation with 100% oxygen Induction Type: IV induction Ventilation: Mask ventilation without difficulty and Oral airway inserted - appropriate to patient size Laryngoscope Size: Mac and 4 Grade View: Grade I Tube type: Oral Tube size: 7.5 mm Number of attempts: 1 Airway Equipment and Method: Stylet and Oral airway Placement Confirmation: ETT inserted through vocal cords under direct vision, positive ETCO2 and breath sounds checked- equal and bilateral Secured at: 23 cm Tube secured with: Tape Dental Injury: Teeth and Oropharynx as per pre-operative assessment

## 2023-08-29 NOTE — Op Note (Signed)
 PATIENT NAME: Jason [name]    MEDICAL RECORD NO.:  [id number]    DATE OF BIRTH: [birth year]    DATE OF PROCEDURE: 08/29/2023                                  OPERATIVE REPORT     PREOPERATIVE DIAGNOSES: Chronic pain syndrome   POSTOPERATIVE DIAGNOSES: Chronic pain syndrome   PROCEDURE:    T8/9 laminectomy for placement of spinal cord stimulator leads Placement of spinal cord stimulator generator   SURGEON:  Virl Grimes, MD.   ASSISTANTGeraline Knapp, PA-C.   ANESTHESIA:  General endotracheal anesthesia.   COMPLICATIONS:  None.   DISPOSITION:  Stable.   ESTIMATED BLOOD LOSS:  Minimal.   INDICATIONS FOR SURGERY:  Briefly, Mr. Kann is a very pleasant 78- year-old male, who did present to me with ongoing chronic back and bilateral leg pain. He did have a spinal cord stimulator trial, and did report significant alleviation of his pain during the course of the trial.  He did wish to have a permanent spinal cord stimulator placed.  She did present to me for this.  After a full discussion regarding the risks and benefits associated with surgery, she did elect to proceed.     OPERATIVE DETAILS:  On 08/29/2023 , the patient was brought to surgery and general endotracheal anesthesia was administered.  The patient was placed prone on a well-padded flat Jackson bed with a spinal frame. Antibiotics were given and the back was prepped and draped in the usual sterile fashion.  A time-out procedure was performed.  I then localized the T8/9 intervertebral level.  An incision was made over the lower thoracic region, centered over this region.  A self-retaining retractor was placed.  I then used a high-speed bur in addition to a series of Kerrison punches, in order to perform a laminectomy.  The laminectomy was extended as laterally as needed in order to accommodate the width of the paddle lead.  Once the ligamentum flavum was appropriately resected, and the dura was noted, I did develop a  plane between the dorsal aspect of the dura and the lamina using a Woodson followed by a brain ribbon.  No resistance was encountered.  The leads were then advanced through the laminectomy site, and cephalad to the region where the trial leads were placed that resulted in appropriate pain relief.  These advanced very easily with no resistance.  The most cephalad aspect of the leads were at the superior aspect of the T7 vertebral body.  Satisfactory position was confirmed on AP and lateral fluoroscopic imaging.    At this point, the leads were tied to the paraspinal musculature using 3-0 silk ties.  I then made a transverse incision over the right flank.  I then tunneled across the subcutaneous layer, from the flank incision, to the incision over the lower thoracic region.  The leads were then passed through the tunneler, and delivered to the right flank wound.  The leads were then secured into the battery.  At this point, the battery and the leads were placed into the subcutaneous layer in the region of the right flank.  The wounds were then irrigated.  I was very pleased with the resting position of the paddle and the battery.  Final images were obtained confirming appropriate positioning of the paddle.   At this point, the fascia in the thoracic region was closed  using #1 Vicryl.  Both incisions were then closed using 2-0 Vicryl followed by  4-0 Monocryl.  Benzoin and Steri-Strips were applied, followed by a sterile dressing.  All instrument counts were correct at the termination of the procedure.   Of note, Geraline Knapp, PA-C, was my assistant throughout surgery, and did aid in retraction, suctioning, and closure from start to finish.     Virl Grimes, MD

## 2023-08-29 NOTE — Anesthesia Postprocedure Evaluation (Signed)
 Anesthesia Post Note  Patient: Jason [name]  Procedure(s) Performed: THORACIC LAMINECTOMY FOR SPINAL CORD STIMULATOR (Back)     Patient location during evaluation: PACU Anesthesia Type: General Level of consciousness: awake Pain management: pain level controlled Vital Signs Assessment: post-procedure vital signs reviewed and stable Respiratory status: spontaneous breathing, nonlabored ventilation and respiratory function stable Cardiovascular status: blood pressure returned to baseline and stable Postop Assessment: no apparent nausea or vomiting Anesthetic complications: no   There were no known notable events for this encounter.  Last Vitals:  Vitals:   08/29/23 1800 08/29/23 1815  BP: (!) 97/55 (!) 99/59  Pulse: 64 63  Resp: 16 13  Temp: 36.6 C   SpO2: 100% 98%    Last Pain:  Vitals:   08/29/23 1800  TempSrc:   PainSc: 4                  Conard Decent

## 2023-08-29 NOTE — Transfer of Care (Signed)
 Immediate Anesthesia Transfer of Care Note  Patient: Jason [name]  Procedure(s) Performed: THORACIC LAMINECTOMY FOR SPINAL CORD STIMULATOR (Back)  Patient Location: PACU  Anesthesia Type:General  Level of Consciousness: awake, alert , and oriented  Airway & Oxygen Therapy: Patient Spontanous Breathing  Post-op Assessment: Report given to RN and Post -op Vital signs reviewed and stable  Post vital signs: Reviewed and stable  Last Vitals:  Vitals Value Taken Time  BP 117/61 08/29/23 1736  Temp    Pulse 68 08/29/23 1739  Resp 19 08/29/23 1739  SpO2 99 % 08/29/23 1739  Vitals shown include unfiled device data.  Last Pain:  Vitals:   08/29/23 1226  TempSrc:   PainSc: 6       Patients Stated Pain Goal: 4 (08/29/23 1226)  Complications: There were no known notable events for this encounter.

## 2023-08-29 NOTE — H&P (Signed)
 PREOPERATIVE H&P  Chief Complaint: Low back pain, bilateral leg pain  HPI: Jason [name] is a 78 y.o. male who presents with ongoing pain in the back and bilateral legs.  He has had symptoms for many years.  He did present to me after having had a spinal cord stimulator trial.  He did report 90% pain relief for the duration of the trial, and as such, I did feel it was reasonable to proceed with placement of a permanent spinal cord stimulator.  The patient does present today to proceed with this procedure.   Past Medical History:  Diagnosis Date   Cancer [facility])    Kidney cancer   Diabetes mellitus without complication (HCC)    Dysrhythmia    atrial fibrillation   GERD (gastroesophageal reflux disease)    History of anemia    Childhood   History of cataract    History of kidney stones    History of migraine    resolved   Hypertension    states under control with med., has been on med. x 5 yr.   Insomnia    Low blood pressure    x3 hospitalized once for it   Memory difficulty 05/09/2018   MRSA (methicillin resistant staph aureus) culture positive 09/03/2018   Paresthesia 04/27/2015   Peripheral neuropathy 11/06/2017   Peripheral vascular disease (HCC)    RLS (restless legs syndrome) 04/27/2015   Seasonal allergies    Sleep apnea    no CPAP use, REPORTS HE NEEDS A NEW ONE    Staph aureus infection 09/03/2018   TIA (transient ischemic attack)    Unilateral osteoarthritis of knee 12/2014   left   Past Surgical History:  Procedure Laterality Date   CARDIOVERSION N/A 08/31/2022   Procedure: CARDIOVERSION;  Surgeon: Sheryle Donning, MD;  Location: [facility] INVASIVE CV LAB;  Service: Cardiovascular;  Laterality: N/A;   CATARACT EXTRACTION, BILATERAL     COLONOSCOPY     CYSTOSCOPY WITH URETEROSCOPY AND STENT PLACEMENT Bilateral 09/08/2018   Procedure: CYSTOSCOPY WITH BILATERAL  URETEROSCOPY HOLMIUM LASER AND STENT PLACEMENT;  Surgeon: Samson Croak, MD;  Location: [facility]  ORS;  Service: Urology;  Laterality: Bilateral;   LAPAROSCOPIC NEPHRECTOMY, HAND ASSISTED Right 10/15/2018   Procedure: HAND ASSISTED LAPAROSCOPIC NEPHRECTOMY;  Surgeon: Samson Croak, MD;  Location: [facility] ORS;  Service: Urology;  Laterality: Right;   PARTIAL KNEE ARTHROPLASTY Right 06/02/2013   Procedure: RIGHT UNICOMPARTMENTAL KNEE;  Surgeon: Neville Barbone, MD;  Location: [facility] OR;  Service: Orthopedics;  Laterality: Right;   PARTIAL KNEE ARTHROPLASTY Left 12/17/2014   Procedure: LEFT KNEE UNI ARTHROPLASTY ;  Surgeon: Osa Blase, MD;  Location: [facility];  Service: Orthopedics;  Laterality: Left;   TONSILLECTOMY  1950's   UPPER GI ENDOSCOPY     Social History   Socioeconomic History   Marital status: Married    Spouse name: Libby Ree   Number of children: 2   Years of education: 14   Highest education level: Not on file  Occupational History   Occupation: self-employed  Tobacco Use   Smoking status: Former    Types: Pipe   Smokeless tobacco: Never   Tobacco comments:    06/02/2013   Vaping Use   Vaping status: Never Used  Substance and Sexual Activity   Alcohol  use: Yes    Comment: occasionally   Drug use: No   Sexual activity: Yes  Other Topics Concern   Not on file  Social  History Narrative   Retiring from [employer].     Patient drinks 1 cup of caffeine daily.   Patient is right handed.    Social Drivers of Corporate investment banker Strain: Low Risk  (08/07/2023)   Received from [facility]   Overall Financial Resource Strain (CARDIA)    Difficulty of Paying Living Expenses: Not hard at all  Food Insecurity: No Food Insecurity (08/07/2023)   [facility]   Hunger Vital Sign    Worried About Running Out of Food in the Last Year: Never true    Ran Out of Food in the Last Year: Never true  Transportation Needs: No Transportation Needs (08/07/2023)   Received from [facility] - Transportation    Lack of Transportation (Medical):  No    Lack of Transportation (Non-Medical): No  Physical Activity: Sufficiently Active (08/07/2023)   Received from [facility]   Exercise Vital Sign    Days of Exercise per Week: 3 days    Minutes of Exercise per Session: 60 min  Stress: No Stress Concern Present (08/07/2023)   Received from [facility] Occupational [facility] - Occupational Stress Questionnaire    Feeling of Stress : Only a little  Social Connections: Moderately Integrated (08/07/2023)   Received from [facility]   Social Connection and Isolation Panel [NHANES]    Frequency of Communication with Friends and Family: More than three times a week    Frequency of Social Gatherings with Friends and Family: Once a week    Attends Religious Services: More than 4 times per year    Active Member of [employer] or Organizations: No    Attends Engineer, structural: Never    Marital Status: Married   Family History  Problem Relation Age of Onset   Emphysema Father        Died age 13   COPD Father    Sudden death Mother        Died age 31   CAD Brother        Died age 10   Hypertension Brother    Allergies  Allergen Reactions   Augmentin [Amoxicillin-Pot Clavulanate] Nausea Only        Penicillins Nausea Only         Clavulanic Acid Nausea Only   Prior to Admission medications   Medication Sig Start Date End Date Taking? Authorizing Provider  apixaban (ELIQUIS) 2.5 MG TABS tablet Take 2.5 mg by mouth 2 (two) times daily.   Yes [provider]  DULoxetine  (CYMBALTA ) 60 MG capsule Take 1 capsule (60 mg total) by mouth 2 (two) times daily. 11/12/22  Yes Phebe Brasil, MD  glipiZIDE (GLUCOTROL XL) 2.5 MG 24 hr tablet Take 1 tablet by mouth daily. 08/09/23 09/08/23 Yes [provider]  metoprolol  tartrate (LOPRESSOR ) 25 MG tablet Take 25 mg by mouth every evening.    Yes [provider]  pantoprazole  (PROTONIX ) 40 MG tablet Take 40 mg by mouth daily.   Yes [provider]  pregabalin  (LYRICA ) 75 MG capsule Take 1 capsule (75 mg total) by mouth 3 (three) times daily. 08/15/23  Yes Wess Hammed, NP  rosuvastatin (CRESTOR) 10 MG tablet Take 10 mg by mouth daily. 08/19/19  Yes [provider]  rotigotine  (NEUPRO ) 4 MG/24HR APPLY 1 PATCH EVERY DAY TOPICALLY 05/22/23  Yes Wess Hammed, NP  tamsulosin  (FLOMAX ) 0.4 MG CAPS capsule  Take 0.4 mg by mouth daily.   Yes [provider]  traMADol  (ULTRAM ) 50 MG tablet Take 1 tablet (50 mg total) by mouth every 6 (six) hours as needed. Patient taking differently: Take 50 mg by mouth every 12 (twelve) hours as needed for moderate pain (pain score 4-6). 01/31/22  Yes Wess Hammed, NP  Vibegron (GEMTESA) 75 MG TABS Take 75 mg by mouth daily.   Yes [provider]  Vitamin D-Vitamin K (VITAMIN K2-VITAMIN D3 PO) Take 1 capsule by mouth daily.   Yes [provider]  vitamin E 1000 UNIT capsule Take 1,000 Units by mouth daily.   Yes [provider]  furosemide (LASIX) 20 MG tablet Take 20 mg by mouth daily as needed for edema.    [provider]     All other systems have been reviewed and were otherwise negative with the exception of those mentioned in the HPI and as above.  Physical Exam: Vitals:   08/29/23 1158  BP: 113/74  Pulse: 67  Resp: 18  Temp: 98 F (36.7 C)  SpO2: 99%    Body mass index is 34.29 kg/m.  General: Alert, no acute distress Cardiovascular: No pedal edema Respiratory: No cyanosis, no use of accessory musculature Skin: No lesions in the area of chief complaint Neurologic: Sensation intact distally Psychiatric: Patient is competent for consent with normal mood and affect Lymphatic: No axillary or cervical lymphadenopathy   Assessment/Plan: CHRONIC PAIN SYNDROME Plan for Procedure(s): THORACIC LAMINECTOMY AND PLACEMENT OF SPINAL CORD STIMULATOR AND BATTERY   Murel Arlington, MD 08/29/2023 2:23 PM

## 2023-08-30 ENCOUNTER — Other Ambulatory Visit: Payer: Self-pay

## 2023-08-30 MED FILL — Thrombin (Recombinant) For Soln 20000 Unit: CUTANEOUS | Qty: 1 | Status: AC

## 2023-09-02 ENCOUNTER — Encounter (HOSPITAL_COMMUNITY): Payer: Self-pay | Admitting: Orthopedic Surgery

## 2023-09-08 ENCOUNTER — Other Ambulatory Visit: Payer: Self-pay | Admitting: Neurology

## 2023-09-09 NOTE — Telephone Encounter (Signed)
 Requested Prescriptions   Pending Prescriptions Disp Refills   pregabalin  (LYRICA ) 75 MG capsule [Pharmacy Med Name: pregabalin  75 mg capsule] 90 capsule 0    Sig: TAKE ONE CAPSULE BY MOUTH THREE TIMES DAILY   Last seen: 08/06/23 Next appt: 9/16/5    Phone note from 5/15:  I called his daughter. His Lyrica  was reduced in the hospital 75 mg TID. I will send in the refill. His last creatinine I see is 2.13, making his creatinine clearance 44, he can have a max Lyrica  of 300 mg a daily in 2-3 divided doses    Dispenses    Dispensed Days Supply Quantity Provider Pharmacy  pregabalin  75 mg capsule 08/15/2023 30 90 each Wess Hammed, NP [facility] [place] ...  pregabalin  150 mg capsule 08/06/2023 30 90 each Wess Hammed, NP [facility] [place], ...  pregabalin  200 mg capsule 08/06/2023 14 21 each Wess Hammed, NP [facility] [place], ...  pregabalin  200 mg capsule 07/29/2023 7 21 each Wess Hammed, NP [facility] [place], ...  PREGABALIN  200 MG CAPSULE 04/05/2023 30  Phebe Brasil, MD [facility] Mail D...  pregabalin  200 mg capsule 04/05/2023 30 90 capsule Phebe Brasil, MD [facility]...  PREGABALIN  200 MG CAPSULE 11/13/2022 30  Phebe Brasil, MD [facility] Mail D...  pregabalin  200 mg capsule 11/13/2022 30 90 capsule Phebe Brasil, MD [facility] Ma.Aaron AasAaron Aas

## 2023-09-13 DIAGNOSIS — R778 Other specified abnormalities of plasma proteins: Secondary | ICD-10-CM | POA: Diagnosis not present

## 2023-09-13 DIAGNOSIS — N189 Chronic kidney disease, unspecified: Secondary | ICD-10-CM | POA: Diagnosis not present

## 2023-09-16 ENCOUNTER — Encounter (HOSPITAL_COMMUNITY): Payer: Self-pay | Admitting: Orthopedic Surgery

## 2023-09-17 ENCOUNTER — Encounter (HOSPITAL_COMMUNITY): Payer: Self-pay | Admitting: Orthopedic Surgery

## 2023-09-17 DIAGNOSIS — M4302 Spondylolysis, cervical region: Secondary | ICD-10-CM | POA: Diagnosis not present

## 2023-09-17 DIAGNOSIS — M5412 Radiculopathy, cervical region: Secondary | ICD-10-CM | POA: Diagnosis not present

## 2023-09-17 DIAGNOSIS — M542 Cervicalgia: Secondary | ICD-10-CM | POA: Diagnosis not present

## 2023-09-18 ENCOUNTER — Encounter (HOSPITAL_COMMUNITY): Payer: Self-pay | Admitting: Orthopedic Surgery

## 2023-09-27 DIAGNOSIS — C641 Malignant neoplasm of right kidney, except renal pelvis: Secondary | ICD-10-CM | POA: Diagnosis not present

## 2023-09-28 DIAGNOSIS — N182 Chronic kidney disease, stage 2 (mild): Secondary | ICD-10-CM | POA: Diagnosis not present

## 2023-09-28 DIAGNOSIS — N200 Calculus of kidney: Secondary | ICD-10-CM | POA: Diagnosis not present

## 2023-09-28 DIAGNOSIS — D638 Anemia in other chronic diseases classified elsewhere: Secondary | ICD-10-CM | POA: Diagnosis not present

## 2023-09-28 DIAGNOSIS — G4733 Obstructive sleep apnea (adult) (pediatric): Secondary | ICD-10-CM | POA: Diagnosis not present

## 2023-10-01 DIAGNOSIS — I4891 Unspecified atrial fibrillation: Secondary | ICD-10-CM | POA: Diagnosis not present

## 2023-10-01 DIAGNOSIS — E119 Type 2 diabetes mellitus without complications: Secondary | ICD-10-CM | POA: Diagnosis not present

## 2023-10-01 DIAGNOSIS — N329 Bladder disorder, unspecified: Secondary | ICD-10-CM | POA: Diagnosis not present

## 2023-10-17 DIAGNOSIS — M542 Cervicalgia: Secondary | ICD-10-CM | POA: Diagnosis not present

## 2023-10-17 DIAGNOSIS — M5412 Radiculopathy, cervical region: Secondary | ICD-10-CM | POA: Diagnosis not present

## 2023-10-17 DIAGNOSIS — M4302 Spondylolysis, cervical region: Secondary | ICD-10-CM | POA: Diagnosis not present

## 2023-10-28 ENCOUNTER — Encounter: Payer: Self-pay | Admitting: Neurology

## 2023-10-28 ENCOUNTER — Telehealth: Payer: Self-pay

## 2023-10-28 NOTE — Telephone Encounter (Signed)
 Call made, see additional phone note

## 2023-11-02 DIAGNOSIS — E119 Type 2 diabetes mellitus without complications: Secondary | ICD-10-CM | POA: Diagnosis not present

## 2023-11-02 DIAGNOSIS — G4733 Obstructive sleep apnea (adult) (pediatric): Secondary | ICD-10-CM | POA: Diagnosis not present

## 2023-11-02 DIAGNOSIS — I4891 Unspecified atrial fibrillation: Secondary | ICD-10-CM | POA: Diagnosis not present

## 2023-11-02 DIAGNOSIS — E66811 Obesity, class 1: Secondary | ICD-10-CM | POA: Diagnosis not present

## 2023-11-02 DIAGNOSIS — N329 Bladder disorder, unspecified: Secondary | ICD-10-CM | POA: Diagnosis not present

## 2023-12-04 DIAGNOSIS — Z9181 History of falling: Secondary | ICD-10-CM | POA: Diagnosis not present

## 2023-12-04 DIAGNOSIS — E119 Type 2 diabetes mellitus without complications: Secondary | ICD-10-CM | POA: Diagnosis not present

## 2023-12-04 DIAGNOSIS — I4891 Unspecified atrial fibrillation: Secondary | ICD-10-CM | POA: Diagnosis not present

## 2023-12-04 DIAGNOSIS — N329 Bladder disorder, unspecified: Secondary | ICD-10-CM | POA: Diagnosis not present

## 2023-12-04 DIAGNOSIS — G4733 Obstructive sleep apnea (adult) (pediatric): Secondary | ICD-10-CM | POA: Diagnosis not present

## 2023-12-04 DIAGNOSIS — M47817 Spondylosis without myelopathy or radiculopathy, lumbosacral region: Secondary | ICD-10-CM | POA: Diagnosis not present

## 2023-12-04 DIAGNOSIS — Z96653 Presence of artificial knee joint, bilateral: Secondary | ICD-10-CM | POA: Diagnosis not present

## 2023-12-04 DIAGNOSIS — Z9989 Dependence on other enabling machines and devices: Secondary | ICD-10-CM | POA: Diagnosis not present

## 2023-12-04 DIAGNOSIS — E66811 Obesity, class 1: Secondary | ICD-10-CM | POA: Diagnosis not present

## 2023-12-16 NOTE — Progress Notes (Unsigned)
 Patient: Jason [name] Jason [name] [name] Date of Birth: Aug 27, 1945  Reason for Visit: Follow up for neuropathy, RLS History from: Patient, wife, daughter, son on the phone Primary Neurologist: Dr. Onita  ASSESSMENT AND PLAN 78 y.o. year old male   1.  Small fiber neuropathy, chronic pain 2.  Restless leg syndrome 3.  Chronic low back pain 4.  Severe OSA  He was taking Lyrica  200 mg 3 times daily, then ran out for several weeks.  During that time it sounds as though he exhibited some withdrawal symptoms (falls, confusion, GI upset, poor appetite).  He has been back on Lyrica  for the last 6 days, he has been feeling some better, confusion Jason [name] [name] improving, gait Jason [name] [name] a little bit better.  He has had multiple falls, with strike to head.  Sounds as though there Jason [name] [name] concern from family for accidental medication non-adherence.  He Jason [name] [name] prescribed tramadol , Neupro , Requip , Cymbalta . Family Jason [name] [name] now managing medication, they would like to evaluate his medication list and consolidate.   -We decided to slowly reduce his Lyrica  from 200 mg 3 times a day to 150 3 times a day over 3 weeks. - Advised to discontinue Requip  use, uses a few times weekly for breakthrough RLS pain - Continue follow-up at [facility], Jason [name] [name] scheduled for spinal cord stimulator 08/29/2023 - Check CT head due to multiple falls with head injury - Check routine labs to rule out electrolyte abnormality contribution  - Resume CPAP use - Follow-up with me in 4 months  No orders of the defined types were placed in this encounter.  No orders of the defined types were placed in this encounter.  IMPRESSION: CT head without contrast showing mild age-related changes of chronic small vessel disease and generalized cerebral atrophy.  No acute abnormalities.   HISTORY OF PRESENT ILLNESS: Today 12/16/23 Last visit labs showed creatinine 6.25, Potassium 5.8, TSH 7.610, advised to go to the ER, admitted for fluids, AKI.  08/06/23 Jason [name]: Here with his wife and  daughter. He missed his granddaughter's wedding. He ran out of Lyrica , 200 mg TID, had ran out for a few weeks, before he ran out, was only taking 1 a day. He was tired, laying in bed, like withdrawing from Lyrica , nausea. Has been back on medication 200 mg TID for 6 days, feels better but still confused, not much appetite. Last 2 days are better. Last week fell several times, poor balance. Son on the phone, mentions issues with medications, confusion with medication, daughter Jason [name] [name] now managing medications. Mentions opiate problem in the past. Continues with pain management, getting tramadol  and radiofrequency ablation. Surgery planned with Jason [name] Jason [name] [name] spinal cord stimulator 5/29. Follow up with [facility] next week. Not using CPAP. Taking Tramadol  up to twice daily. Takes Requip  4 mg as needed. Family feels he Jason [name] [name] overmedicated. Daughter doesn't think he Jason [name] [name] taking Jason [name] [name].   12/24/22 Jason [name]: Has been seeing [facility]. Had radiofrequency ablation to lumbar spine last week, will also being having cervical ablation. Has had ESI with them, has done PT. Very pleased with pain center. Wearing compression stocking velcro wraps for legs, has ordered boots, has been having lymphedema, developed sores to his legs. Went to wound center. Lymphedema for around 1 year. Doing OT. AFIB with cardioversion in May 2024. Remains on Cymbalta  60 mg BID, Lyrica  200 mg TID, Neupro  Jason [name] [name] a life saver for him, has switched another insurance, not sure what the coverage will be. Rarely has to use Requip   for breakthrough only if he forgets to change the patch. In general RLS under great control. His feet are numb, but they hurt and burn. Has not fallen, but uses a cane for safety all the time, balance Jason [name] [name] issue. Jason [name] [name] diabetic now, on oral medication.   Update 01/31/22 Jason [name]: Jason [name] Jason [name] [name] here today for follow-up. Jason [name] [name] prescribed Lyrica  200 mg 3 times daily, but only takes 2 times daily. Has been taking mostly 3 times a day since increased  pain to feet. Lyrica  does help for sure. On Cymbalta  60 mg BID, Neupro  patch 4 mg daily, takes Requip  once a month, Tramadol  PRN last filled in June. Has increased sharp pains to his feet from neuropathy. Has CPAP uses occasionally, I pulled download, don't see any data other than sporadic night or 2 in Sept. He bought 2 yorkie dogs, he loves them. He uses his medications sparingly. Uses cane if needed, 1 fall, no major. Has not seen Dr. Debby for follow-up. Has been told borderline diabetic.   Update 09/21/21  Jason [name]: Jason [name] Jason [name] [name] Jason [name] [name] here today for follow-up.  Has remained off carbamazepine  after a questionable medication overdose last year.  On Lyrica  200 mg 3 times a day (but has only been taking 2, pharmacy only give 2 a day). On Neupro , 4 mg patch, this really helps. On Cymbalta  60 mg BID. Has had worsening neuropathy pain, slowly worsening overtime, pain/burning sensation. Jason [name] [name] worsening up to his knees. Saw Dr. Debby last year. Increased low back pain. If he cooks, leans forward, has to rest after due to neck pain. Very rarely takes Requip . Has tramadol  to take PRN, Jason [name] [name] almost out. Most bothersome Jason [name] [name] the neuropathy. 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 Has been told he has sleep apnea, but doesn't use CPAP. Severe OSA back in July 2021.  HISTORY  03/23/2021 Jason [name]: Jason [name] Jason [name] [name] Jason [name] [name] a 78 year old male with a history of small fiber neuropathy and restless leg syndrome.  He returns today for follow-up.  The patient states that he was hospitalized several months ago for altered mental status.  Dr. Jenel felt that it was medication overdose.  However the patient Jason [name] [name] now stating that his son counted his meds after the fact and he did not take any additional medication.  The patient has not restarted carbamazepine .  He Jason [name] [name] only been taking Lyrica  200 mg twice a day.  He states that since he has not restarted carbamazepine  and taking a lower dose of Lyrica  he has sharp shooting pains in the  legs.  It Jason [name] [name] not constant but does occur very frequently.  He Jason [name] [name] hoping to restart carbamazepine  or increase Lyrica .  He continues on Neupro  patch for restless legs.  He has Requip  but takes it infrequently.  Only for breakthrough symptoms.  He returns today for an evaluation.  REVIEW OF SYSTEMS: Out of a complete 14 system review of symptoms, the patient complains only of the following symptoms, and all other reviewed systems are negative.  See HPI  ALLERGIES: Allergies  Allergen Reactions   Augmentin [Amoxicillin-Pot Clavulanate] Nausea Only        Penicillins Nausea Only         Clavulanic Acid Nausea Only    HOME MEDICATIONS: Outpatient Medications Prior to Visit  Medication Sig Dispense Refill   apixaban (Jason [name] [name]) 2.5 MG TABS tablet Take 2.5 mg by mouth 2 (two) times daily.     DULoxetine  (CYMBALTA ) 60 MG capsule Take 1 capsule (60 mg total)  by mouth 2 (two) times daily. 180 capsule 3   furosemide (LASIX) 20 MG tablet Take 20 mg by mouth daily as needed for edema.     glipiZIDE (GLUCOTROL XL) 2.5 MG 24 hr tablet Take 1 tablet by mouth daily.     HYDROcodone -acetaminophen  (NORCO/VICODIN) 5-325 MG tablet Take 1 tablet by mouth every 6 (six) hours as needed for moderate pain (pain score 4-6) or severe pain (pain score 7-10). 20 tablet 0   methocarbamol  (ROBAXIN ) 500 MG tablet Take 1-2 tablets (500-1,000 mg total) by mouth 4 (four) times daily. 60 tablet 0   metoprolol  tartrate (LOPRESSOR ) 25 MG tablet Take 25 mg by mouth every evening.      pantoprazole  (PROTONIX ) 40 MG tablet Take 40 mg by mouth daily.     pregabalin  (LYRICA ) 75 MG capsule TAKE ONE CAPSULE BY MOUTH THREE TIMES DAILY 90 capsule 3   rosuvastatin (CRESTOR) 10 MG tablet Take 10 mg by mouth daily.     rotigotine  (NEUPRO ) 4 MG/24HR APPLY 1 PATCH EVERY DAY TOPICALLY 90 patch 1   tamsulosin  (FLOMAX ) 0.4 MG CAPS capsule Take 0.4 mg by mouth daily.     Vibegron (GEMTESA) 75 MG TABS Take 75 mg by mouth daily.     Vitamin  D-Vitamin K (VITAMIN K2-VITAMIN D3 PO) Take 1 capsule by mouth daily.     vitamin E 1000 UNIT capsule Take 1,000 Units by mouth daily.     No facility-administered medications prior to visit.    PAST MEDICAL HISTORY: Past Medical History:  Diagnosis Date   Cancer [facility])    Kidney cancer   Diabetes mellitus without complication (HCC)    Dysrhythmia    atrial fibrillation   GERD (gastroesophageal reflux disease)    History of anemia    Childhood   History of cataract    History of kidney stones    History of migraine    resolved   Hypertension    states under control with med., has been on med. x 5 yr.   Insomnia    Low blood pressure    x3 hospitalized once for it   Memory difficulty 05/09/2018   MRSA (methicillin resistant staph aureus) culture positive 09/03/2018   Paresthesia 04/27/2015   Peripheral neuropathy 11/06/2017   Peripheral vascular disease (HCC)    RLS (restless legs syndrome) 04/27/2015   Seasonal allergies    Sleep apnea    no CPAP use, REPORTS HE NEEDS A NEW ONE    Staph aureus infection 09/03/2018   TIA (transient ischemic attack)    Unilateral osteoarthritis of knee 12/2014   left    PAST SURGICAL HISTORY: Past Surgical History:  Procedure Laterality Date   CARDIOVERSION N/A 08/31/2022   Procedure: CARDIOVERSION;  Surgeon: Lonni Slain, MD;  Location: [facility] INVASIVE CV LAB;  Service: Cardiovascular;  Laterality: N/A;   CATARACT EXTRACTION, BILATERAL     COLONOSCOPY     CYSTOSCOPY WITH URETEROSCOPY AND STENT PLACEMENT Bilateral 09/08/2018   Procedure: CYSTOSCOPY WITH BILATERAL  URETEROSCOPY HOLMIUM LASER AND STENT PLACEMENT;  Surgeon: Carolee Sherwood JONETTA DOUGLAS, MD;  Location: [facility] ORS;  Service: Urology;  Laterality: Bilateral;   LAPAROSCOPIC NEPHRECTOMY, HAND ASSISTED Right 10/15/2018   Procedure: HAND ASSISTED LAPAROSCOPIC NEPHRECTOMY;  Surgeon: Carolee Sherwood JONETTA DOUGLAS, MD;  Location: [facility];  Service: Urology;  Laterality: Right;   PARTIAL KNEE ARTHROPLASTY  Right 06/02/2013   Procedure: RIGHT UNICOMPARTMENTAL KNEE;  Surgeon: Fonda SHAUNNA Olmsted, MD;  Location: [facility] OR;  Service: Orthopedics;  Laterality: Right;  PARTIAL KNEE ARTHROPLASTY Left 12/17/2014   Procedure: LEFT KNEE UNI ARTHROPLASTY ;  Surgeon: Fonda Olmsted, MD;  Location: [facility];  Service: Orthopedics;  Laterality: Left;   THORACIC LAMINECTOMY FOR SPINAL CORD STIMULATOR N/A 08/29/2023   Procedure: THORACIC LAMINECTOMY FOR SPINAL CORD STIMULATOR;  Surgeon: Jason [name] Jason [name] [name] Anes, MD;  Location: [facility] OR;  Service: Orthopedics;  Laterality: N/A;  THORACIC LAMINECTOMY AND PLACEMENT OF SPINAL CORD STIMULATOR PADDLE AND BATTERY   TONSILLECTOMY  1950's   UPPER GI ENDOSCOPY      FAMILY HISTORY: Family History  Problem Relation Age of Onset   Emphysema Father        Died age 29   COPD Father    Sudden death Mother        Died age 51   CAD Brother        Died age 22   Hypertension Brother     SOCIAL HISTORY: Social History   Socioeconomic History   Marital status: Married    Spouse name: Candis   Number of children: 2   Years of education: 14   Highest education level: Not on file  Occupational History   Occupation: self-employed  Tobacco Use   Smoking status: Former    Types: Pipe   Smokeless tobacco: Never   Tobacco comments:    06/02/2013   Vaping Use   Vaping status: Never Used  Substance and Sexual Activity   Alcohol  use: Yes    Comment: occasionally   Drug use: No   Sexual activity: Yes  Other Topics Concern   Not on file  Social History Narrative   Retiring from Product manager.     Patient drinks 1 cup of caffeine daily.   Patient Jason [name] [name] right handed.    Social Drivers of Corporate investment banker Strain: Low Risk  (08/07/2023)   Received from [facility]   Overall Financial Resource Strain (CARDIA)    Difficulty of Paying Living Expenses: Not hard at all  Food Insecurity: No Food Insecurity (08/07/2023)   Received from [facility]   Hunger Vital Sign     Within the past 12 months, you worried that your food would run out before you got the money to buy more.: Never true    Within the past 12 months, the food you bought just didn't last and you didn't have money to get more.: Never true  Transportation Needs: No Transportation Needs (08/07/2023)   Received from [facility] - Transportation    Lack of Transportation (Medical): No    Lack of Transportation (Non-Medical): No  Physical Activity: Sufficiently Active (08/07/2023)   Received from [facility]   Exercise Vital Sign    On average, how many days per week do you engage in moderate to strenuous exercise (like a brisk walk)?: 3 days    On average, how many minutes do you engage in exercise at this level?: 60 min  Stress: No Stress Concern Present (08/07/2023)   Received from [facility] - Occupational Stress Questionnaire    Feeling of Stress : Only a little  Social Connections: Moderately Integrated (08/07/2023)   Received from [facility]   Social Connection and Isolation Panel    In a typical week, how many times do you talk on the phone with family, friends, or neighbors?: More than three times a week    How often do you get together with friends  or relatives?: Once a week    How often do you attend church or religious services?: More than 4 times per year    Do you belong to any clubs or organizations such as church groups, unions, fraternal or athletic groups, or school groups?: No    How often do you attend meetings of the clubs or organizations you belong to?: Never    Are you married, widowed, divorced, separated, never married, or living with a partner?: Married  Intimate Partner Violence: Not At Risk (08/07/2023)   Received from [facility]   Humiliation, Afraid, Rape, and Kick questionnaire    Within the last year, have you been afraid of your partner or ex-partner?: No    Within the last year, have you been  humiliated or emotionally abused in other ways by your partner or ex-partner?: No    Within the last year, have you been kicked, hit, slapped, or otherwise physically hurt by your partner or ex-partner?: No    Within the last year, have you been raped or forced to have any kind of sexual activity by your partner or ex-partner?: No   PHYSICAL EXAM  There were no vitals filed for this visit.  There Jason [name] [name] no height or weight on file to calculate BMI.  Generalized: Well developed, in no acute distress  Neurological examination  Mentation: Alert oriented to time, place, history taking. Follows all commands speech and language fluent Cranial nerve II-XII: Pupils were equal round reactive to light. Extraocular movements were full, visual field were full on confrontational test. Facial sensation and strength were normal. Head turning and shoulder shrug  were normal and symmetric. Motor: The motor testing reveals 5 over 5 strength of all 4 extremities. Good symmetric motor tone Jason [name] [name] noted throughout.  Sensory: Length dependent sensory deficit lower extremities to mid shin area, wearing compression stockings, Velcro wraps  Coordination: Cerebellar testing reveals good finger-nose-finger and heel-to-shin bilaterally. Mild tremor with finger to nose  Gait and station: Gait Jason [name] [name] antalgic, can walk short distance independent, otherwise has walker  Reflexes: Deep tendon reflexes are symmetric and normal bilaterally.   DIAGNOSTIC DATA (LABS, IMAGING, TESTING) - I reviewed patient records, labs, notes, testing and imaging myself where available.  Lab Results  Component Value Date   WBC 7.2 08/06/2023   HGB 13.7 08/06/2023   HCT 40.3 08/06/2023   MCV 95 08/06/2023   PLT 201 08/06/2023      Component Value Date/Time   NA 138 08/19/2023 1130   NA 137 08/06/2023 0939   K 4.6 08/19/2023 1130   CL 103 08/19/2023 1130   CO2 27 08/19/2023 1130   GLUCOSE 148 (H) 08/19/2023 1130   BUN 17 08/19/2023 1130   BUN  83 (HH) 08/06/2023 0939   CREATININE 1.37 (H) 08/19/2023 1130   CALCIUM 9.4 08/19/2023 1130   PROT 7.2 08/06/2023 0939   ALBUMIN  4.3 08/06/2023 0939   AST 11 08/06/2023 0939   ALT 16 08/06/2023 0939   ALKPHOS 109 08/06/2023 0939   BILITOT 0.6 08/06/2023 0939   GFRNONAA 53 (L) 08/19/2023 1130   GFRAA 81 09/14/2019 1523   No results found for: CHOL, HDL, LDLCALC, LDLDIRECT, TRIG, CHOLHDL Lab Results  Component Value Date   HGBA1C 6.6 (H) 09/14/2019   Lab Results  Component Value Date   VITAMINB12 477 04/08/2017   Lab Results  Component Value Date   TSH 7.610 (H) 08/06/2023    Lauraine Born, AGNP-C, DNP 12/16/2023, 9:52 PM [facility] [phone]  [address], [address] [place], [place] [address] [phone]

## 2023-12-17 ENCOUNTER — Ambulatory Visit: Admitting: Neurology

## 2023-12-17 ENCOUNTER — Encounter: Payer: Self-pay | Admitting: Neurology

## 2023-12-17 ENCOUNTER — Telehealth: Payer: Self-pay

## 2023-12-17 VITALS — BP 130/68 | Ht 68.0 in | Wt 232.0 lb

## 2023-12-17 DIAGNOSIS — G2581 Restless legs syndrome: Secondary | ICD-10-CM

## 2023-12-17 DIAGNOSIS — G4730 Sleep apnea, unspecified: Secondary | ICD-10-CM | POA: Diagnosis not present

## 2023-12-17 DIAGNOSIS — G603 Idiopathic progressive neuropathy: Secondary | ICD-10-CM

## 2023-12-17 NOTE — Patient Instructions (Signed)
 Restart CPAP, mask refit, DME: [facility] Phone: [phone], press option 1 Fax: [phone]  Check labs today   Call pain [facility]   Continue current medications

## 2023-12-17 NOTE — Telephone Encounter (Signed)
 Cpap order sent to dme thru community msg in epic :

## 2023-12-18 ENCOUNTER — Ambulatory Visit: Payer: Self-pay | Admitting: Neurology

## 2023-12-18 LAB — BASIC METABOLIC PANEL WITH GFR
BUN/Creatinine Ratio: 16 (ref 10–24)
BUN: 18 mg/dL (ref 8–27)
CO2: 23 mmol/L (ref 20–29)
Calcium: 9.2 mg/dL (ref 8.6–10.2)
Chloride: 100 mmol/L (ref 96–106)
Creatinine, Ser: 1.13 mg/dL (ref 0.76–1.27)
Glucose: 96 mg/dL (ref 70–99)
Potassium: 4.7 mmol/L (ref 3.5–5.2)
Sodium: 137 mmol/L (ref 134–144)
eGFR: 67 mL/min/1.73 (ref >59)

## 2023-12-18 MED ORDER — PREGABALIN 75 MG PO CAPS: 75.0000 mg | ORAL_CAPSULE | Freq: Three times a day (TID) | ORAL | 1 refills | Status: DC

## 2023-12-18 NOTE — Telephone Encounter (Signed)
 Kidney function is normal. I refilled Lyrica  to [facility]. They will not be getting any refills at [facility].   Meds ordered this encounter  Medications   pregabalin  (LYRICA ) 75 MG capsule    Sig: Take 1 capsule (75 mg total) by mouth 3 (three) times daily.    Dispense:  270 capsule    Refill:  1   .

## 2023-12-27 ENCOUNTER — Other Ambulatory Visit: Payer: Self-pay | Admitting: Neurology

## 2023-12-27 NOTE — Telephone Encounter (Signed)
 Pt should have enough until mid- Oct, based on last fill date    Dispensed Days Supply Quantity Provider Pharmacy  duloxetine  60 mg capsule,delayed release 10/17/2023 90 180 capsule Onita Duos, MD [facility] [email or web address]

## 2024-01-04 DIAGNOSIS — G4733 Obstructive sleep apnea (adult) (pediatric): Secondary | ICD-10-CM | POA: Diagnosis not present

## 2024-01-04 DIAGNOSIS — E66811 Obesity, class 1: Secondary | ICD-10-CM | POA: Diagnosis not present

## 2024-01-04 DIAGNOSIS — N329 Bladder disorder, unspecified: Secondary | ICD-10-CM | POA: Diagnosis not present

## 2024-01-04 DIAGNOSIS — M47817 Spondylosis without myelopathy or radiculopathy, lumbosacral region: Secondary | ICD-10-CM | POA: Diagnosis not present

## 2024-01-14 ENCOUNTER — Other Ambulatory Visit: Payer: Self-pay | Admitting: Neurology

## 2024-01-14 NOTE — Telephone Encounter (Signed)
 Requested Prescriptions   Pending Prescriptions Disp Refills   pregabalin  (LYRICA ) 75 MG capsule [Pharmacy Med Name: pregabalin  75 mg capsule] 90 capsule 3    Sig: TAKE ONE CAPSULE BY MOUTH THREE TIMES DAILY   Last seen 12/17/23 Next appt 07/28/24 Dispenses   Dispensed Days Supply Quantity Provider Pharmacy  PREGABALIN  75 MG CAPSULE 12/19/2023 90  Gayland Lauraine PARAS, NP [facility] Mail D...  pregabalin  75 mg capsule 12/19/2023 90 270 capsule Gayland Lauraine PARAS, NP [facility] Ma...  pregabalin  75 mg capsule 11/21/2023 30 90 each Gayland Lauraine PARAS, NP [facility] [place], ...  pregabalin  75 mg capsule 10/23/2023 30 90 each Gayland Lauraine PARAS, NP [facility] [place], ...  pregabalin  200 mg capsule 10/17/2023 30 90 capsule Gayland Lauraine PARAS, NP [facility] Ma...  pregabalin  75 mg capsule 09/11/2023 30 90 each Gayland Lauraine PARAS, NP [facility] [place], ...  pregabalin  75 mg capsule 08/15/2023 30 90 each Gayland Lauraine PARAS, NP [facility] [place], ...  pregabalin  150 mg capsule 08/06/2023 30 90 each Gayland Lauraine PARAS, NP [facility] [place], ...  pregabalin  200 mg capsule 08/06/2023 14 21 each Gayland Lauraine PARAS, NP [facility] [place], ...  pregabalin  200 mg capsule 07/29/2023 7 21 each Gayland Lauraine PARAS, NP [facility] [place], ...  PREGABALIN  200 MG CAPSULE 04/05/2023 30  Onita Duos, MD [facility] Mail D...  pregabalin  200 mg capsule 04/05/2023 30 90 capsule Onita Duos, MD [facility] [email or web address]

## 2024-01-21 DIAGNOSIS — M542 Cervicalgia: Secondary | ICD-10-CM | POA: Diagnosis not present

## 2024-01-21 DIAGNOSIS — M545 Low back pain, unspecified: Secondary | ICD-10-CM | POA: Diagnosis not present

## 2024-01-21 DIAGNOSIS — M792 Neuralgia and neuritis, unspecified: Secondary | ICD-10-CM | POA: Diagnosis not present

## 2024-01-21 DIAGNOSIS — G894 Chronic pain syndrome: Secondary | ICD-10-CM | POA: Diagnosis not present

## 2024-01-21 DIAGNOSIS — M47816 Spondylosis without myelopathy or radiculopathy, lumbar region: Secondary | ICD-10-CM | POA: Diagnosis not present

## 2024-01-21 DIAGNOSIS — M4722 Other spondylosis with radiculopathy, cervical region: Secondary | ICD-10-CM | POA: Diagnosis not present

## 2024-01-24 ENCOUNTER — Telehealth: Payer: Self-pay

## 2024-01-24 NOTE — Progress Notes (Signed)
   01/24/2024  Patient ID: Wadie LELON Benders, male   DOB: 1945-08-24, 78 y.o.   MRN: [id number]  Reviewed patient regarding medication adherence from a quality report for [facility].   Per [facility] and payer portal fill history: 1. Rosuvastatin 10 mg - last filled 12/27/23 for a 90-day supply.  2. Lisinopril 2.5 mg - last filled 12/27/23 for a 90-day supply. 3. Glipizide 5 mg - last filled 12/27/23 for 90-day supply.   I will follow up for adherence monitoring.  Thank you for allowing pharmacy to be a part of this patient's care.    Heather Factor, PharmD Clinical Pharmacist  [phone]

## 2024-01-30 DIAGNOSIS — M5412 Radiculopathy, cervical region: Secondary | ICD-10-CM | POA: Diagnosis not present

## 2024-02-04 DIAGNOSIS — I4891 Unspecified atrial fibrillation: Secondary | ICD-10-CM | POA: Diagnosis not present

## 2024-02-04 DIAGNOSIS — E119 Type 2 diabetes mellitus without complications: Secondary | ICD-10-CM | POA: Diagnosis not present

## 2024-02-04 DIAGNOSIS — N329 Bladder disorder, unspecified: Secondary | ICD-10-CM | POA: Diagnosis not present

## 2024-02-11 DIAGNOSIS — I2510 Atherosclerotic heart disease of native coronary artery without angina pectoris: Secondary | ICD-10-CM | POA: Diagnosis not present

## 2024-02-11 DIAGNOSIS — I509 Heart failure, unspecified: Secondary | ICD-10-CM | POA: Diagnosis not present

## 2024-02-11 DIAGNOSIS — Z131 Encounter for screening for diabetes mellitus: Secondary | ICD-10-CM | POA: Diagnosis not present

## 2024-02-11 DIAGNOSIS — R5383 Other fatigue: Secondary | ICD-10-CM | POA: Diagnosis not present

## 2024-02-11 DIAGNOSIS — I480 Paroxysmal atrial fibrillation: Secondary | ICD-10-CM | POA: Diagnosis not present

## 2024-02-18 DIAGNOSIS — Z79891 Long term (current) use of opiate analgesic: Secondary | ICD-10-CM | POA: Diagnosis not present

## 2024-02-18 DIAGNOSIS — M545 Low back pain, unspecified: Secondary | ICD-10-CM | POA: Diagnosis not present

## 2024-02-18 DIAGNOSIS — M792 Neuralgia and neuritis, unspecified: Secondary | ICD-10-CM | POA: Diagnosis not present

## 2024-02-18 DIAGNOSIS — M542 Cervicalgia: Secondary | ICD-10-CM | POA: Diagnosis not present

## 2024-02-18 DIAGNOSIS — M4722 Other spondylosis with radiculopathy, cervical region: Secondary | ICD-10-CM | POA: Diagnosis not present

## 2024-02-18 DIAGNOSIS — G894 Chronic pain syndrome: Secondary | ICD-10-CM | POA: Diagnosis not present

## 2024-02-18 DIAGNOSIS — M47816 Spondylosis without myelopathy or radiculopathy, lumbar region: Secondary | ICD-10-CM | POA: Diagnosis not present

## 2024-03-03 DIAGNOSIS — R079 Chest pain, unspecified: Secondary | ICD-10-CM | POA: Diagnosis not present

## 2024-03-03 DIAGNOSIS — I340 Nonrheumatic mitral (valve) insufficiency: Secondary | ICD-10-CM | POA: Diagnosis not present

## 2024-03-03 DIAGNOSIS — R000 Tachycardia, unspecified: Secondary | ICD-10-CM | POA: Diagnosis not present

## 2024-03-10 DIAGNOSIS — E119 Type 2 diabetes mellitus without complications: Secondary | ICD-10-CM | POA: Diagnosis not present

## 2024-03-10 DIAGNOSIS — R809 Proteinuria, unspecified: Secondary | ICD-10-CM | POA: Diagnosis not present

## 2024-03-10 DIAGNOSIS — I10 Essential (primary) hypertension: Secondary | ICD-10-CM | POA: Diagnosis not present

## 2024-03-10 DIAGNOSIS — N189 Chronic kidney disease, unspecified: Secondary | ICD-10-CM | POA: Diagnosis not present

## 2024-03-10 DIAGNOSIS — D631 Anemia in chronic kidney disease: Secondary | ICD-10-CM | POA: Diagnosis not present

## 2024-03-10 DIAGNOSIS — D649 Anemia, unspecified: Secondary | ICD-10-CM | POA: Diagnosis not present

## 2024-04-14 ENCOUNTER — Encounter: Payer: Self-pay | Admitting: Neurology

## 2024-04-14 MED ORDER — PREGABALIN 75 MG PO CAPS: 75.0000 mg | ORAL_CAPSULE | Freq: Three times a day (TID) | ORAL | 1 refills | Status: AC

## 2024-04-14 NOTE — Telephone Encounter (Signed)
 Requested Prescriptions   Pending Prescriptions Disp Refills   pregabalin  (LYRICA ) 75 MG capsule 270 capsule 1    Sig: Take 1 capsule (75 mg total) by mouth 3 (three) times daily.   Last seen 12/17/23 Next appt 07/28/24 Dispenses   Dispensed Days Supply Quantity Provider Pharmacy  PREGABALIN  75 MG CAPSULE 12/19/2023 90  Gayland Lauraine PARAS, NP [facility] Mail D...  pregabalin  75 mg capsule 12/19/2023 90 270 capsule Gayland Lauraine PARAS, NP [facility] Ma...  pregabalin  75 mg capsule 11/21/2023 30 90 each Gayland Lauraine PARAS, NP [facility] [place], ...  pregabalin  75 mg capsule 10/23/2023 30 90 each Gayland Lauraine PARAS, NP [facility] [place], ...  pregabalin  200 mg capsule 10/17/2023 30 90 capsule Gayland Lauraine PARAS, NP [facility] Ma...  pregabalin  75 mg capsule 09/11/2023 30 90 each Gayland Lauraine PARAS, NP [facility], ...  pregabalin  75 mg capsule 08/15/2023 30 90 each Gayland Lauraine PARAS, NP [facility] [place], ...  pregabalin  150 mg capsule 08/06/2023 30 90 each Gayland Lauraine PARAS, NP [facility] [place], ...  pregabalin  200 mg capsule 08/06/2023 14 21 each Gayland Lauraine PARAS, NP [facility] [place], ...  pregabalin  200 mg capsule 07/29/2023 7 21 each Gayland Lauraine PARAS, NP [facility] [place], .SABRASABRA

## 2024-04-22 ENCOUNTER — Encounter (INDEPENDENT_AMBULATORY_CARE_PROVIDER_SITE_OTHER): Payer: Self-pay | Admitting: *Deleted

## 2024-04-22 ENCOUNTER — Telehealth: Payer: Self-pay | Admitting: *Deleted

## 2024-04-22 NOTE — Telephone Encounter (Signed)
 Received office note from visit at [facility] on 04/21/2024. Note placed in Sarah NP's office for review.

## 2024-07-28 ENCOUNTER — Ambulatory Visit: Admitting: Neurology
